# Patient Record
Sex: Male | Born: 1965 | Race: Black or African American | Hispanic: No | Smoking: Former smoker
Health system: Southern US, Community
[De-identification: ages and names within clinical notes are randomized; demographics above are authoritative.]

## PROBLEM LIST (undated history)

## (undated) DIAGNOSIS — Z9989 Dependence on other enabling machines and devices: Secondary | ICD-10-CM

## (undated) DIAGNOSIS — T8859XA Other complications of anesthesia, initial encounter: Secondary | ICD-10-CM

## (undated) DIAGNOSIS — I351 Nonrheumatic aortic (valve) insufficiency: Secondary | ICD-10-CM

## (undated) DIAGNOSIS — K5790 Diverticulosis of intestine, part unspecified, without perforation or abscess without bleeding: Secondary | ICD-10-CM

## (undated) DIAGNOSIS — G4733 Obstructive sleep apnea (adult) (pediatric): Secondary | ICD-10-CM

## (undated) DIAGNOSIS — M1990 Unspecified osteoarthritis, unspecified site: Secondary | ICD-10-CM

## (undated) DIAGNOSIS — K449 Diaphragmatic hernia without obstruction or gangrene: Secondary | ICD-10-CM

## (undated) DIAGNOSIS — E785 Hyperlipidemia, unspecified: Secondary | ICD-10-CM

## (undated) DIAGNOSIS — K5792 Diverticulitis of intestine, part unspecified, without perforation or abscess without bleeding: Secondary | ICD-10-CM

## (undated) DIAGNOSIS — Z973 Presence of spectacles and contact lenses: Secondary | ICD-10-CM

## (undated) DIAGNOSIS — Q2543 Congenital aneurysm of aorta: Secondary | ICD-10-CM

## (undated) DIAGNOSIS — I10 Essential (primary) hypertension: Secondary | ICD-10-CM

## (undated) DIAGNOSIS — N329 Bladder disorder, unspecified: Secondary | ICD-10-CM

## (undated) DIAGNOSIS — Q2549 Other congenital malformations of aorta: Secondary | ICD-10-CM

## (undated) DIAGNOSIS — I4892 Unspecified atrial flutter: Secondary | ICD-10-CM

## (undated) DIAGNOSIS — N400 Enlarged prostate without lower urinary tract symptoms: Secondary | ICD-10-CM

## (undated) DIAGNOSIS — I712 Thoracic aortic aneurysm, without rupture, unspecified: Secondary | ICD-10-CM

## (undated) HISTORY — PX: SHOULDER SURGERY: SHX246

## (undated) HISTORY — DX: Diverticulosis of intestine, part unspecified, without perforation or abscess without bleeding: K57.90

## (undated) HISTORY — PX: COLON RESECTION: SHX5231

## (undated) HISTORY — DX: Nonrheumatic aortic (valve) insufficiency: I35.1

## (undated) HISTORY — DX: Thoracic aortic aneurysm, without rupture, unspecified: I71.20

## (undated) HISTORY — DX: Unspecified atrial flutter: I48.92

---

## 1993-06-28 HISTORY — PX: KNEE ARTHROSCOPY: SHX127

## 1997-06-28 HISTORY — PX: ELBOW SURGERY: SHX618

## 1998-07-23 ENCOUNTER — Other Ambulatory Visit: Admission: RE | Admit: 1998-07-23 | Discharge: 1998-07-23 | Payer: Self-pay | Admitting: Orthopedic Surgery

## 2000-06-28 HISTORY — PX: COLON RESECTION: SHX5231

## 2004-01-13 ENCOUNTER — Encounter: Admission: RE | Admit: 2004-01-13 | Discharge: 2004-01-13 | Payer: Self-pay | Admitting: Internal Medicine

## 2004-01-21 ENCOUNTER — Encounter: Admission: RE | Admit: 2004-01-21 | Discharge: 2004-01-21 | Payer: Self-pay | Admitting: Internal Medicine

## 2004-08-07 ENCOUNTER — Encounter: Admission: RE | Admit: 2004-08-07 | Discharge: 2004-08-07 | Payer: Self-pay | Admitting: Internal Medicine

## 2004-08-09 ENCOUNTER — Emergency Department (HOSPITAL_COMMUNITY): Admission: EM | Admit: 2004-08-09 | Discharge: 2004-08-09 | Payer: Self-pay | Admitting: Emergency Medicine

## 2004-08-13 ENCOUNTER — Encounter: Admission: RE | Admit: 2004-08-13 | Discharge: 2004-08-13 | Payer: Self-pay | Admitting: Internal Medicine

## 2004-08-24 ENCOUNTER — Ambulatory Visit (HOSPITAL_COMMUNITY): Admission: RE | Admit: 2004-08-24 | Discharge: 2004-08-24 | Payer: Self-pay | Admitting: Gastroenterology

## 2011-09-25 ENCOUNTER — Emergency Department (HOSPITAL_COMMUNITY)
Admission: EM | Admit: 2011-09-25 | Discharge: 2011-09-25 | Disposition: A | Discharge status: Home / Self Care | Payer: 59 | Attending: Emergency Medicine | Admitting: Emergency Medicine

## 2011-09-25 ENCOUNTER — Encounter (HOSPITAL_COMMUNITY): Payer: Self-pay | Admitting: *Deleted

## 2011-09-25 ENCOUNTER — Emergency Department (HOSPITAL_COMMUNITY): Payer: 59

## 2011-09-25 DIAGNOSIS — J40 Bronchitis, not specified as acute or chronic: Secondary | ICD-10-CM | POA: Insufficient documentation

## 2011-09-25 DIAGNOSIS — I10 Essential (primary) hypertension: Secondary | ICD-10-CM | POA: Insufficient documentation

## 2011-09-25 HISTORY — DX: Essential (primary) hypertension: I10

## 2011-09-25 LAB — CBC
HCT: 42.5 % (ref 39.0–52.0)
Hemoglobin: 14.7 g/dL (ref 13.0–17.0)
MCV: 78.3 fL (ref 78.0–100.0)
Platelets: 179 10*3/uL (ref 150–400)
RBC: 5.43 MIL/uL (ref 4.22–5.81)
WBC: 3.5 10*3/uL — ABNORMAL LOW (ref 4.0–10.5)

## 2011-09-25 LAB — BASIC METABOLIC PANEL
Calcium: 8.9 mg/dL (ref 8.4–10.5)
Creatinine, Ser: 1.43 mg/dL — ABNORMAL HIGH (ref 0.50–1.35)
GFR calc Af Amer: 68 mL/min — ABNORMAL LOW (ref >90)
GFR calc non Af Amer: 59 mL/min — ABNORMAL LOW (ref >90)

## 2011-09-25 MED ORDER — AZITHROMYCIN 250 MG PO TABS: 250.0000 mg | ORAL_TABLET | Freq: Every day | ORAL | Status: AC

## 2011-09-25 MED ORDER — BENZONATATE 100 MG PO CAPS: 100.0000 mg | ORAL_CAPSULE | Freq: Three times a day (TID) | ORAL | Status: AC | PRN

## 2011-09-25 NOTE — ED Notes (Signed)
Pt c/o intermittent chills. States that he was seen at Urgent Care three days ago and given rx for bactrim. States that he does not feel better and feels like he is "dragging" at the end of the day.

## 2011-09-25 NOTE — ED Notes (Signed)
Pt has been having continuing fever, chills, congestion and generally not feeling well.  Pt is on on antibiotic for URI

## 2011-09-25 NOTE — Discharge Instructions (Signed)
Bronchitis Bronchitis is a problem of the air tubes leading to your lungs. This problem makes it hard for air to get in and out of the lungs. You may cough a lot because your air tubes are narrow. Going without care can cause lasting (chronic) bronchitis. HOME CARE   Drink enough fluids to keep your pee (urine) clear or pale yellow.   Use a cool mist humidifier.   Quit smoking if you smoke. If you keep smoking, the bronchitis might not get better.   Only take medicine as told by your doctor.  GET HELP RIGHT AWAY IF:   Coughing keeps you awake.   You start to wheeze.   You become more sick or weak.   You have a hard time breathing or get short of breath.   You cough up blood.   Coughing lasts more than 2 weeks.   You have a fever.   Your baby is older than 3 months with a rectal temperature of 102 F (38.9 C) or higher.   Your baby is 58 months old or younger with a rectal temperature of 100.4 F (38 C) or higher.  MAKE SURE YOU:  Understand these instructions.   Will watch your condition.   Will get help right away if you are not doing well or get worse.  Document Released: 12/01/2007 Document Revised: 06/03/2011 Document Reviewed: 05/16/2009 [facility] Patient Information 2012 [place], [place].

## 2011-09-25 NOTE — ED Provider Notes (Signed)
History     CSN: [id number]  Arrival date & time 09/25/11  0308   First MD Initiated Contact with Patient 09/25/11 [phone]      Chief Complaint  Patient presents with  . Fever    (Consider location/radiation/quality/duration/timing/severity/associated sxs/prior treatment) HPI Pt with > 1week of worsening cough productive of yellow sputum, fatigue and subjective fevers and chills. Pt seen at urgent care center and treated for small abscess on abd which is now healed. But not feeling any better. No chest pain, SOB, lower ext swelling Past Medical History  Diagnosis Date  . Hypertension     Past Surgical History  Procedure Date  . Abdominal surgery   . Shoulder arthrotomy     No family history on file.  History  Substance Use Topics  . Smoking status: Not on file  . Smokeless tobacco: Not on file  . Alcohol Use: Yes      Review of Systems  Constitutional: Positive for fever, chills and fatigue.  HENT: Negative for congestion and sore throat.   Respiratory: Positive for cough. Negative for shortness of breath.   Cardiovascular: Negative for chest pain, palpitations and leg swelling.  Gastrointestinal: Negative for nausea, vomiting, abdominal pain and diarrhea.  Musculoskeletal: Negative for back pain.  Skin: Negative for color change, pallor and rash.  Neurological: Negative for dizziness, weakness, numbness and headaches.    Allergies  Review of patient's allergies indicates no known allergies.  Home Medications   Current Outpatient Rx  Name Route Sig Dispense Refill  . ACETAMINOPHEN 325 MG PO TABS Oral Take 650 mg by mouth every 4 (four) hours as needed. For fever    . SULFAMETHOXAZOLE-TRIMETHOPRIM 800-160 MG PO TABS Oral Take 1 tablet by mouth 2 (two) times daily. For 10 days. Started on Wednesday 3/27    . VALSARTAN 40 MG PO TABS Oral Take 40 mg by mouth daily.    . AZITHROMYCIN 250 MG PO TABS Oral Take 1 tablet (250 mg total) by mouth daily. Take first 2  tablets together, then 1 every day until finished. 6 tablet 0  . BENZONATATE 100 MG PO CAPS Oral Take 1 capsule (100 mg total) by mouth 3 (three) times daily as needed for cough. 20 capsule 0    BP 143/88  Pulse 81  Temp(Src) 98.2 F (36.8 C) (Oral)  Resp 20  SpO2 100%  Physical Exam  Nursing note and vitals reviewed. Constitutional: He is oriented to person, place, and time. He appears well-developed and well-nourished. No distress.  HENT:  Head: Normocephalic and atraumatic.  Mouth/Throat: Oropharynx is clear and moist.  Eyes: EOM are normal. Pupils are equal, round, and reactive to light.  Neck: Normal range of motion. Neck supple.  Cardiovascular: Normal rate and regular rhythm.   Pulmonary/Chest: Effort normal. No respiratory distress. He has no wheezes. He has rales (scattered rales).  Abdominal: Soft. Bowel sounds are normal. There is no tenderness. There is no rebound and no guarding.  Musculoskeletal: Normal range of motion. He exhibits no edema and no tenderness.  Neurological: He is alert and oriented to person, place, and time.  Skin: Skin is warm and dry. No rash noted. No erythema.       Well healing abscess. No evidence of cellulitis  Psychiatric: He has a normal mood and affect. His behavior is normal.    ED Course  Procedures (including critical care time)  Labs Reviewed  CBC - Abnormal; Notable for the following:    WBC 3.5 (*)  All other components within normal limits  BASIC METABOLIC PANEL - Abnormal; Notable for the following:    Glucose, Bld 122 (*)    Creatinine, Ser 1.43 (*)    GFR calc non Af Amer 59 (*)    GFR calc Af Amer 68 (*)    All other components within normal limits   Dg Chest 2 View  09/25/2011  *RADIOLOGY REPORT*  Clinical Data: Congestion, cough, sinus pressure, fever and body aches for 6 days.  CHEST - 2 VIEW  Comparison: None.  Findings: The heart size and pulmonary vascularity are normal. The lungs appear clear and expanded  without focal air space disease or consolidation. No blunting of the costophrenic angles.  No pneumothorax.  Postoperative changes in the left shoulder.  IMPRESSION: No evidence of active pulmonary disease.  Original Report Authenticated By: Marlon Pel, M.D.     1. Bronchitis       MDM  Bronchitis vs pneumonia        Loren Racer, MD 09/25/11 [id number]

## 2012-07-09 ENCOUNTER — Emergency Department (HOSPITAL_BASED_OUTPATIENT_CLINIC_OR_DEPARTMENT_OTHER)
Admission: EM | Admit: 2012-07-09 | Discharge: 2012-07-09 | Disposition: A | Discharge status: Home / Self Care | Payer: 59 | Attending: Emergency Medicine | Admitting: Emergency Medicine

## 2012-07-09 ENCOUNTER — Encounter (HOSPITAL_BASED_OUTPATIENT_CLINIC_OR_DEPARTMENT_OTHER): Payer: Self-pay | Admitting: *Deleted

## 2012-07-09 DIAGNOSIS — I10 Essential (primary) hypertension: Secondary | ICD-10-CM | POA: Insufficient documentation

## 2012-07-09 DIAGNOSIS — L272 Dermatitis due to ingested food: Secondary | ICD-10-CM | POA: Insufficient documentation

## 2012-07-09 DIAGNOSIS — R221 Localized swelling, mass and lump, neck: Secondary | ICD-10-CM | POA: Insufficient documentation

## 2012-07-09 DIAGNOSIS — R21 Rash and other nonspecific skin eruption: Secondary | ICD-10-CM | POA: Insufficient documentation

## 2012-07-09 DIAGNOSIS — Z79899 Other long term (current) drug therapy: Secondary | ICD-10-CM | POA: Insufficient documentation

## 2012-07-09 DIAGNOSIS — L509 Urticaria, unspecified: Secondary | ICD-10-CM | POA: Insufficient documentation

## 2012-07-09 DIAGNOSIS — T7840XA Allergy, unspecified, initial encounter: Secondary | ICD-10-CM

## 2012-07-09 DIAGNOSIS — R220 Localized swelling, mass and lump, head: Secondary | ICD-10-CM | POA: Insufficient documentation

## 2012-07-09 MED ORDER — FAMOTIDINE 20 MG PO TABS
ORAL_TABLET | ORAL | Status: AC
  Filled 2012-07-09: qty 1

## 2012-07-09 MED ORDER — FAMOTIDINE 20 MG PO TABS
40.0000 mg | ORAL_TABLET | Freq: Once | ORAL | Status: AC
  Administered 2012-07-09: 40 mg via ORAL
  Filled 2012-07-09: qty 1

## 2012-07-09 MED ORDER — METHYLPREDNISOLONE SODIUM SUCC 125 MG IJ SOLR
125.0000 mg | Freq: Once | INTRAMUSCULAR | Status: AC
  Administered 2012-07-09: 125 mg via INTRAMUSCULAR
  Filled 2012-07-09: qty 2

## 2012-07-09 MED ORDER — DIPHENHYDRAMINE HCL 25 MG PO CAPS
25.0000 mg | ORAL_CAPSULE | Freq: Once | ORAL | Status: AC
  Administered 2012-07-09: 25 mg via ORAL
  Filled 2012-07-09: qty 1

## 2012-07-09 MED ORDER — PREDNISONE 20 MG PO TABS: 40.0000 mg | ORAL_TABLET | Freq: Every day | ORAL | Status: DC

## 2012-07-09 MED ORDER — EPINEPHRINE 0.3 MG/0.3ML IJ DEVI: 0.3000 mg | Freq: Once | INTRAMUSCULAR | Status: DC

## 2012-07-09 NOTE — Discharge Instructions (Signed)
Epinephrine Injection  Epinephrine is a medicine given by injection to temporarily treat an emergency allergic reaction. It is also used to treat severe asthmatic attacks and other lung problems. The medicine helps to enlarge (dilate) the small breathing tubes of the lungs. A life-threatening, sudden allergic reaction that involves the whole body is called anaphylaxis. Because of potential side effects, epinephrine should only be used as directed by your caregiver.  RISKS AND COMPLICATIONS  Possible side effects of epinephrine injections include:   Chest pain.   Irregular or rapid heartbeat.   Shortness of breath.   Nausea.   Vomiting.   Abdominal pain or cramping.   Sweating.   Dizziness.   Weakness.   Headache.   Nervousness.  Report all side effects to your caregiver.  HOW TO GIVE AN EPINEPHRINE INJECTION  Give the epinephrine injection immediately when symptoms of a severe reaction begin. Inject the medicine into the outer thigh or any available, large muscle. Your caregiver can teach you how to do this. You do not need to remove any clothing. After the injection, call your local emergency services (911 in [place] Even if you improve after the injection, you need to be examined at a hospital emergency department. Epinephrine works quickly, but it also wears off quickly. Delayed reactions can occur. A delayed reaction may be as serious and dangerous as the initial reaction.  HOME CARE INSTRUCTIONS   Make sure you and your family know how to give an epinephrine injection.   Use epinephrine injections as directed by your caregiver. Do not use this medicine more often or in larger doses than prescribed.   Always carry your epinephrine injection or anaphylaxis kit with you. This can be lifesaving if you have a severe reaction.   Store the medicine in a cool, dry place. If the medicine becomes discolored or cloudy, dispose of it properly and replace it with new medicine.    Check the expiration date on your medicine. It may be unsafe to use medicines past their expiration date.   Tell your caregiver about any other medicines you are taking. Some medicines can react badly with epinephrine.   Tell your caregiver about any medical conditions you have, such as diabetes, high blood pressure (hypertension), heart disease, irregular heartbeats, or if you are pregnant.  SEEK IMMEDIATE MEDICAL CARE IF:   You have used an epinephrine injection. Call your local emergency services (911 in [place] Even if you improve after the injection, you need to be examined at a hospital emergency department to make sure your allergic reaction is under control. You will also be monitored for adverse effects from the medicine.   You have chest pain.   You have irregular or fast heartbeats.   You have shortness of breath.   You have severe headaches.   You have severe nausea, vomiting, or abdominal cramps.   You have severe pain, swelling, or redness in the area where you gave the injection.  Document Released: 06/11/2000 Document Revised: 09/06/2011 Document Reviewed: 03/03/2011  [employer] Patient Information 2013 [employer].

## 2012-07-09 NOTE — ED Notes (Signed)
Patient reports he is feeling better, states decreased swelling, itching. Decrease in edema to throat/neck noted on reassessment. Pt tolerating PO fluids. Denies discomfort or difficulty swallowing, in NAD.

## 2012-07-09 NOTE — ED Notes (Signed)
Pt states he ate crablegs about 1500 and began breaking out in welts and itching about 30 min ago. Took Benadryl x 3 PTA

## 2012-07-09 NOTE — ED Provider Notes (Signed)
History   This chart was scribed for [name]. [name], MD by Donne Anon, ED Scribe. This patient was seen in room MH09/MH09 and the patient's care was started at 2051.   CSN: [id number]  Arrival date & time 07/09/12  Matthew Hoffman   First MD Initiated Contact with Patient 07/09/12 2051      Chief Complaint  Patient presents with  . Allergic Reaction     The history is provided by the patient. No language interpreter was used.   Matthew Hoffman is a 47 y.o. male who presents to the Emergency Department complaining of an allergic reaction which occurred while he was eating crab 6 hours ago, with his symptoms starting 90 minutes after eating. He reports that 7 years ago he had a similar episode with crab, but he has since eaten crab without incident. He reports associated facial swelling, neck swelling, and hives. He took Benadryl 3 hours ago with mild relief. He denies any other health issues.  Past Medical History  Diagnosis Date  . Hypertension     Past Surgical History  Procedure Date  . Abdominal surgery   . Shoulder arthrotomy     History reviewed. No pertinent family history.  History  Substance Use Topics  . Smoking status: Never Smoker   . Smokeless tobacco: Not on file  . Alcohol Use: Yes      Review of Systems  HENT: Positive for facial swelling.   Respiratory: Negative for shortness of breath.   Skin: Positive for rash.    Allergies  Shellfish allergy  Home Medications   Current Outpatient Rx  Name  Route  Sig  Dispense  Refill  . ACETAMINOPHEN 325 MG PO TABS   Oral   Take 650 mg by mouth every 4 (four) hours as needed. For fever         . EPINEPHRINE 0.3 MG/0.3ML IJ DEVI   Intramuscular   Inject 0.3 mLs (0.3 mg total) into the muscle once.   1 Device   2   . PREDNISONE 20 MG PO TABS   Oral   Take 2 tablets (40 mg total) by mouth daily.   14 tablet   0   . SULFAMETHOXAZOLE-TRIMETHOPRIM 800-160 MG PO TABS   Oral   Take 1 tablet by mouth 2  (two) times daily. For 10 days. Started on Wednesday 3/27         . VALSARTAN 40 MG PO TABS   Oral   Take 40 mg by mouth daily.           Triage Vitals: BP 149/95  Pulse 58  Temp 98 F (36.7 C) (Oral)  Resp 18  SpO2 98%  Physical Exam  Nursing note and vitals reviewed. Constitutional: He is oriented to person, place, and time. He appears well-developed and well-nourished. No distress.  HENT:  Head: Normocephalic and atraumatic.  Mouth/Throat: Oropharynx is clear and moist.  Eyes: EOM are normal. Pupils are equal, round, and reactive to light.  Neck: Neck supple. No tracheal deviation present.  Cardiovascular: Normal rate.   Pulmonary/Chest: Effort normal and breath sounds normal. No stridor. No respiratory distress. He has no wheezes.  Musculoskeletal: Normal range of motion. He exhibits edema.  Neurological: He is alert and oriented to person, place, and time.  Skin: Skin is warm and dry. Rash noted.  Psychiatric: He has a normal mood and affect. His behavior is normal.    ED Course  Procedures (including critical care time) DIAGNOSTIC STUDIES: Oxygen  Saturation is 98% on room air, normal by my interpretation.    COORDINATION OF CARE: 9:11 PM Discussed treatment plan which includes steroids and an Epi-Pen with pt at bedside and pt agreed to plan.   10:26 PM Recheck. Pt states that medication is helping and his symptoms are beginning to improve.   Meds ordered this encounter  Medications  . methylPREDNISolone sodium succinate (SOLU-MEDROL) 125 mg/2 mL injection 125 mg    Sig:   . famotidine (PEPCID) tablet 40 mg    Sig:   . diphenhydrAMINE (BENADRYL) capsule 25 mg    Sig:   . famotidine (PEPCID) 20 MG tablet    Sig:     Shon Baton, Bobby: cabinet override  . predniSONE (DELTASONE) 20 MG tablet    Sig: Take 2 tablets (40 mg total) by mouth daily.    Dispense:  14 tablet    Refill:  0  . EPINEPHrine (EPIPEN) 0.3 mg/0.3 mL DEVI    Sig: Inject 0.3 mLs (0.3 mg  total) into the muscle once.    Dispense:  1 Device    Refill:  2      Labs Reviewed - No data to display No results found.   1. Severe allergic reaction     10:39 PM PT reports feeling improved, face is less swollen, airway remains intact, no stridor, wheezing.  Will give Rx for epi pen and prednisone.  Can follow up with PCP.  Encouraged use of benadryl at home over next 24 hours as scheduled dose, and allergy testing formally in 2 weeks.    MDM  I personally performed the services described in this documentation, which was scribed in my presence. The recorded information has been reviewed and is accurate.   Pt with delayed allergic reaction, but involving lips, mouth and face.  No airway compromise at present.  Pt has had allergies to crab legs 7 years ago, had an epi pen, but has had crab legs since then with no problems.  No other new products, meds.          [name]. [name], MD 07/09/12 2242

## 2012-07-09 NOTE — ED Notes (Signed)
RTT at bedside assessing patient.

## 2013-01-19 ENCOUNTER — Other Ambulatory Visit: Payer: Self-pay | Admitting: Sports Medicine

## 2013-01-19 DIAGNOSIS — M549 Dorsalgia, unspecified: Secondary | ICD-10-CM

## 2013-01-24 ENCOUNTER — Ambulatory Visit
Admission: RE | Admit: 2013-01-24 | Discharge: 2013-01-24 | Disposition: A | Discharge status: Home / Self Care | Payer: 59 | Source: Ambulatory Visit | Attending: Sports Medicine | Admitting: Sports Medicine

## 2013-01-24 DIAGNOSIS — M549 Dorsalgia, unspecified: Secondary | ICD-10-CM

## 2014-02-20 ENCOUNTER — Other Ambulatory Visit: Payer: Self-pay | Admitting: Sports Medicine

## 2014-02-20 DIAGNOSIS — S46212A Strain of muscle, fascia and tendon of other parts of biceps, left arm, initial encounter: Secondary | ICD-10-CM

## 2014-02-20 DIAGNOSIS — S76112A Strain of left quadriceps muscle, fascia and tendon, initial encounter: Secondary | ICD-10-CM

## 2014-03-01 ENCOUNTER — Ambulatory Visit
Admission: RE | Admit: 2014-03-01 | Discharge: 2014-03-01 | Disposition: A | Discharge status: Home / Self Care | Payer: 59 | Source: Ambulatory Visit | Attending: Sports Medicine | Admitting: Sports Medicine

## 2014-03-01 DIAGNOSIS — S76112A Strain of left quadriceps muscle, fascia and tendon, initial encounter: Secondary | ICD-10-CM

## 2015-04-18 ENCOUNTER — Other Ambulatory Visit: Payer: Self-pay | Admitting: Gastroenterology

## 2015-04-18 DIAGNOSIS — R109 Unspecified abdominal pain: Secondary | ICD-10-CM

## 2015-04-28 ENCOUNTER — Ambulatory Visit
Admission: RE | Admit: 2015-04-28 | Discharge: 2015-04-28 | Disposition: A | Discharge status: Home / Self Care | Payer: 59 | Source: Ambulatory Visit | Attending: Gastroenterology | Admitting: Gastroenterology

## 2015-04-28 DIAGNOSIS — R109 Unspecified abdominal pain: Secondary | ICD-10-CM

## 2015-04-28 MED ORDER — IOPAMIDOL (ISOVUE-300) INJECTION 61%
125.0000 mL | Freq: Once | INTRAVENOUS | Status: AC | PRN
  Administered 2015-04-28: 125 mL via INTRAVENOUS

## 2015-10-27 HISTORY — PX: UMBILICAL HERNIA REPAIR: SHX196

## 2016-05-18 ENCOUNTER — Other Ambulatory Visit: Payer: Self-pay | Admitting: Urology

## 2016-06-09 ENCOUNTER — Encounter (HOSPITAL_BASED_OUTPATIENT_CLINIC_OR_DEPARTMENT_OTHER): Payer: Self-pay | Admitting: *Deleted

## 2016-06-09 NOTE — Progress Notes (Signed)
NPO AFTER MN. ARRIVE AT 1100. NEEDS ISTAT 8 AND EKG.  

## 2016-06-10 ENCOUNTER — Ambulatory Visit (HOSPITAL_BASED_OUTPATIENT_CLINIC_OR_DEPARTMENT_OTHER): Payer: BLUE CROSS/BLUE SHIELD | Admitting: Anesthesiology

## 2016-06-10 ENCOUNTER — Encounter (HOSPITAL_BASED_OUTPATIENT_CLINIC_OR_DEPARTMENT_OTHER)
Admission: RE | Disposition: A | Discharge status: Home / Self Care | Payer: Self-pay | Source: Ambulatory Visit | Attending: Urology

## 2016-06-10 ENCOUNTER — Encounter (HOSPITAL_BASED_OUTPATIENT_CLINIC_OR_DEPARTMENT_OTHER): Payer: Self-pay | Admitting: Anesthesiology

## 2016-06-10 ENCOUNTER — Ambulatory Visit (HOSPITAL_BASED_OUTPATIENT_CLINIC_OR_DEPARTMENT_OTHER)
Admission: RE | Admit: 2016-06-10 | Discharge: 2016-06-10 | Disposition: A | Discharge status: Home / Self Care | Payer: BLUE CROSS/BLUE SHIELD | Source: Ambulatory Visit | Attending: Urology | Admitting: Urology

## 2016-06-10 ENCOUNTER — Other Ambulatory Visit: Payer: Self-pay

## 2016-06-10 DIAGNOSIS — I10 Essential (primary) hypertension: Secondary | ICD-10-CM | POA: Insufficient documentation

## 2016-06-10 DIAGNOSIS — Z91013 Allergy to seafood: Secondary | ICD-10-CM | POA: Diagnosis not present

## 2016-06-10 DIAGNOSIS — Z79899 Other long term (current) drug therapy: Secondary | ICD-10-CM | POA: Insufficient documentation

## 2016-06-10 DIAGNOSIS — F17290 Nicotine dependence, other tobacco product, uncomplicated: Secondary | ICD-10-CM | POA: Diagnosis not present

## 2016-06-10 DIAGNOSIS — K449 Diaphragmatic hernia without obstruction or gangrene: Secondary | ICD-10-CM | POA: Diagnosis not present

## 2016-06-10 DIAGNOSIS — M1990 Unspecified osteoarthritis, unspecified site: Secondary | ICD-10-CM | POA: Diagnosis not present

## 2016-06-10 DIAGNOSIS — Z302 Encounter for sterilization: Secondary | ICD-10-CM | POA: Diagnosis not present

## 2016-06-10 DIAGNOSIS — G4733 Obstructive sleep apnea (adult) (pediatric): Secondary | ICD-10-CM | POA: Insufficient documentation

## 2016-06-10 DIAGNOSIS — Z419 Encounter for procedure for purposes other than remedying health state, unspecified: Secondary | ICD-10-CM

## 2016-06-10 HISTORY — DX: Unspecified osteoarthritis, unspecified site: M19.90

## 2016-06-10 HISTORY — DX: Diaphragmatic hernia without obstruction or gangrene: K44.9

## 2016-06-10 HISTORY — DX: Dependence on other enabling machines and devices: Z99.89

## 2016-06-10 HISTORY — DX: Benign prostatic hyperplasia without lower urinary tract symptoms: N40.0

## 2016-06-10 HISTORY — DX: Presence of spectacles and contact lenses: Z97.3

## 2016-06-10 HISTORY — DX: Obstructive sleep apnea (adult) (pediatric): G47.33

## 2016-06-10 HISTORY — PX: VASECTOMY: SHX75

## 2016-06-10 LAB — POCT I-STAT 4, (NA,K, GLUC, HGB,HCT)
Glucose, Bld: 95 mg/dL (ref 65–99)
HEMATOCRIT: 44 % (ref 39.0–52.0)
HEMOGLOBIN: 15 g/dL (ref 13.0–17.0)
Potassium: 3.8 mmol/L (ref 3.5–5.1)
Sodium: 142 mmol/L (ref 135–145)

## 2016-06-10 SURGERY — VASECTOMY
Anesthesia: Monitor Anesthesia Care | Laterality: Bilateral

## 2016-06-10 MED ORDER — FENTANYL CITRATE (PF) 100 MCG/2ML IJ SOLN
INTRAMUSCULAR | Status: AC
  Filled 2016-06-10: qty 2

## 2016-06-10 MED ORDER — CEFAZOLIN SODIUM-DEXTROSE 2-4 GM/100ML-% IV SOLN
2.0000 g | INTRAVENOUS | Status: AC
  Administered 2016-06-10: 2 g via INTRAVENOUS
  Filled 2016-06-10: qty 100

## 2016-06-10 MED ORDER — ONDANSETRON HCL 4 MG/2ML IJ SOLN
INTRAMUSCULAR | Status: AC
  Filled 2016-06-10: qty 2

## 2016-06-10 MED ORDER — KETOROLAC TROMETHAMINE 30 MG/ML IJ SOLN
INTRAMUSCULAR | Status: DC | PRN
  Administered 2016-06-10: 30 mg via INTRAVENOUS

## 2016-06-10 MED ORDER — HYDROMORPHONE HCL 1 MG/ML IJ SOLN
0.2500 mg | INTRAMUSCULAR | Status: DC | PRN
  Filled 2016-06-10: qty 0.5

## 2016-06-10 MED ORDER — PROPOFOL 500 MG/50ML IV EMUL
INTRAVENOUS | Status: AC
  Filled 2016-06-10: qty 50

## 2016-06-10 MED ORDER — FENTANYL CITRATE (PF) 100 MCG/2ML IJ SOLN
INTRAMUSCULAR | Status: DC | PRN
  Administered 2016-06-10 (×2): 25 ug via INTRAVENOUS

## 2016-06-10 MED ORDER — LIDOCAINE 2% (20 MG/ML) 5 ML SYRINGE
INTRAMUSCULAR | Status: AC
  Filled 2016-06-10: qty 5

## 2016-06-10 MED ORDER — MIDAZOLAM HCL 2 MG/2ML IJ SOLN
INTRAMUSCULAR | Status: AC
  Filled 2016-06-10: qty 2

## 2016-06-10 MED ORDER — LIDOCAINE 2% (20 MG/ML) 5 ML SYRINGE
INTRAMUSCULAR | Status: DC | PRN
  Administered 2016-06-10: 50 mg via INTRAVENOUS

## 2016-06-10 MED ORDER — LACTATED RINGERS IV SOLN
INTRAVENOUS | Status: DC
  Administered 2016-06-10 (×2): via INTRAVENOUS
  Filled 2016-06-10: qty 1000

## 2016-06-10 MED ORDER — MIDAZOLAM HCL 5 MG/5ML IJ SOLN
INTRAMUSCULAR | Status: DC | PRN
  Administered 2016-06-10: 2 mg via INTRAVENOUS

## 2016-06-10 MED ORDER — CEFAZOLIN SODIUM-DEXTROSE 2-4 GM/100ML-% IV SOLN
INTRAVENOUS | Status: AC
  Filled 2016-06-10: qty 100

## 2016-06-10 MED ORDER — BUPIVACAINE HCL (PF) 0.25 % IJ SOLN
INTRAMUSCULAR | Status: DC | PRN
  Administered 2016-06-10: 5 mL

## 2016-06-10 MED ORDER — CEFAZOLIN IN D5W 1 GM/50ML IV SOLN
1.0000 g | INTRAVENOUS | Status: DC
  Filled 2016-06-10: qty 50

## 2016-06-10 MED ORDER — PROPOFOL 500 MG/50ML IV EMUL
INTRAVENOUS | Status: DC | PRN
  Administered 2016-06-10: 200 ug/kg/min via INTRAVENOUS

## 2016-06-10 MED ORDER — BUPIVACAINE HCL (PF) 0.25 % IJ SOLN
INTRAMUSCULAR | Status: AC
  Filled 2016-06-10: qty 30

## 2016-06-10 SURGICAL SUPPLY — 33 items
BLADE CLIPPER SURG (BLADE) IMPLANT
BLADE SURG 15 STRL LF DISP TIS (BLADE) ×1 IMPLANT
BLADE SURG 15 STRL SS (BLADE) ×2
BNDG GAUZE ELAST 4 BULKY (GAUZE/BANDAGES/DRESSINGS) ×3 IMPLANT
CLOTH BEACON ORANGE TIMEOUT ST (SAFETY) ×3 IMPLANT
COVER BACK TABLE 60X90IN (DRAPES) ×3 IMPLANT
COVER MAYO STAND STRL (DRAPES) ×3 IMPLANT
DRAPE LAPAROTOMY 100X72 PEDS (DRAPES) ×3 IMPLANT
ELECT NEEDLE TIP 2.8 STRL (NEEDLE) IMPLANT
ELECT REM PT RETURN 9FT ADLT (ELECTROSURGICAL) ×3
ELECTRODE REM PT RTRN 9FT ADLT (ELECTROSURGICAL) ×1 IMPLANT
GLOVE BIO SURGEON STRL SZ8 (GLOVE) ×3 IMPLANT
GLOVE BIOGEL PI IND STRL 7.5 (GLOVE) ×2 IMPLANT
GLOVE BIOGEL PI INDICATOR 7.5 (GLOVE) ×4
GOWN W/2 COTTON TOWELS 2 STD (GOWNS) ×3 IMPLANT
KIT ROOM TURNOVER WOR (KITS) ×3 IMPLANT
MANIFOLD NEPTUNE II (INSTRUMENTS) IMPLANT
NEEDLE HYPO 25X1 1.5 SAFETY (NEEDLE) ×3 IMPLANT
NEEDLE HYPO 25X5/8 SAFETYGLIDE (NEEDLE) IMPLANT
NS IRRIG 500ML POUR BTL (IV SOLUTION) IMPLANT
PACK BASIN DAY SURGERY FS (CUSTOM PROCEDURE TRAY) ×3 IMPLANT
PENCIL BUTTON HOLSTER BLD 10FT (ELECTRODE) ×3 IMPLANT
SUPPORT SCROTAL LG STRP (MISCELLANEOUS) ×2 IMPLANT
SUPPORTER ATHLETIC LG (MISCELLANEOUS) ×1
SUT CHROMIC 3 0 PS 2 (SUTURE) ×3 IMPLANT
SUT SILK 2 0 (SUTURE) ×2
SUT SILK 2-0 18XBRD TIE 12 (SUTURE) ×1 IMPLANT
SYR CONTROL 10ML LL (SYRINGE) ×3 IMPLANT
TOWEL OR 17X24 6PK STRL BLUE (TOWEL DISPOSABLE) ×3 IMPLANT
TRAY DSU PREP LF (CUSTOM PROCEDURE TRAY) ×3 IMPLANT
TUBE CONNECTING 12'X1/4 (SUCTIONS) ×1
TUBE CONNECTING 12X1/4 (SUCTIONS) ×2 IMPLANT
WATER STERILE IRR 500ML POUR (IV SOLUTION) ×3 IMPLANT

## 2016-06-10 NOTE — Transfer of Care (Signed)
Immediate Anesthesia Transfer of Care Note  Patient: Matthew Hoffman  Procedure(s) Performed: Procedure(s): VASECTOMY (Bilateral)  Patient Location: PACU  Anesthesia Type:General  Level of Consciousness: awake, alert , oriented and patient cooperative  Airway & Oxygen Therapy: Patient Spontanous Breathing and Patient connected to nasal cannula oxygen  Post-op Assessment: Report given to RN and Post -op Vital signs reviewed and stable  Post vital signs: Reviewed and stable  Last Vitals:  Vitals:   06/10/16 1100  BP: 140/90  Pulse: (!) 59  Resp: 16  Temp: 36.3 C    Last Pain:  Vitals:   06/10/16 1100  TempSrc: Oral      Patients Stated Pain Goal: 6 (06/10/16 1131)  Complications: No apparent anesthesia complications

## 2016-06-10 NOTE — Op Note (Signed)
Preoperative diagnosis: Desires Sterilization Postop diagnosis: Same  Procedure: 1.  Vasectomy  Attending: Wilkie AyePatrick Allex Lapoint  Anesthesia: General  Estimated blood loss: 5 cc  Drains: 1. none  Specimens: bilateral cross section of vas deferens  Antibiotics: none  Findings: small bilateral spermatic cord lypomas  Indications: Patient is a 50 year old who desires sterilization. After discussing options he desires to proceed with vasectomy.  Procedure in detail: Prior to procedure consent was obtained. Patient was brought to the operating room and briefing was done sure correct patient, correct procedure, correct site.  General anesthesia was in administered patient was placed in the supine position.  The patients genetalia was prepped and draped in the usual, sterile fashion.  A 0.5 cm incision was made in the left hemiscrotum.  The vas was then brought to the level of the incision.  Then using a vas grasper we isolated the vas deferens.  We then used electrocautery to free the vas from the perivasal tissue.  Once the vas was isolated we then placed a clamp on either side of the vas.  Then sharply incised the vas and removed a 1 cm section of vas deferens.  We then cauterized the ends of the vein as and then ligated the vessels with 0 silk suture.  We then placed the individual limbs of vas deferens and separate compartments.  We closed the overlying skin with 3-0 chromic in interrupted fashion.  A similar technique was used on the left side to isolate the left vas.  Left vas was ligated in a similar fashion.  Good hemostasis was also noted in the left hemiscrotum.  We then returned the ends of the vas to separate compartments.  We then closed the overlying skin with 3-0 chromic in an interrupted fashion. This then concluded the procedure which was well tolerated by the patient. Complications: None Condition: Stable, x-rayed, transferred to PACU. Plan: Pt is to be discharged home and followup in  8 weeks with a semen sample

## 2016-06-10 NOTE — Discharge Instructions (Signed)

## 2016-06-10 NOTE — Anesthesia Preprocedure Evaluation (Addendum)
Anesthesia Evaluation  Patient identified by MRN, date of birth, ID band Patient awake    Reviewed: Allergy & Precautions, NPO status , Patient's Chart, lab work & pertinent test results  Airway Mallampati: II  TM Distance: >3 FB     Dental   Pulmonary sleep apnea , Current Smoker,    breath sounds clear to auscultation       Cardiovascular Exercise Tolerance: Good hypertension, Pt. on medications  Rhythm:Regular Rate:Normal     Neuro/Psych    GI/Hepatic Neg liver ROS, hiatal hernia,   Endo/Other  negative endocrine ROS  Renal/GU negative Renal ROS     Musculoskeletal  (+) Arthritis ,   Abdominal   Peds  Hematology   Anesthesia Other Findings   Reproductive/Obstetrics                            Anesthesia Physical Anesthesia Plan  ASA: III  Anesthesia Plan: MAC   Post-op Pain Management:    Induction: Intravenous  Airway Management Planned: Simple Face Mask  Additional Equipment:   Intra-op Plan:   Post-operative Plan:   Informed Consent: I have reviewed the patients History and Physical, chart, labs and discussed the procedure including the risks, benefits and alternatives for the proposed anesthesia with the patient or authorized representative who has indicated his/her understanding and acceptance.   Dental advisory given  Plan Discussed with: CRNA and Anesthesiologist  Anesthesia Plan Comments:         Anesthesia Quick Evaluation

## 2016-06-10 NOTE — Anesthesia Postprocedure Evaluation (Signed)
Anesthesia Post Note  Patient: Matthew Hoffman  Procedure(s) Performed: Procedure(s) (LRB): VASECTOMY (Bilateral)  Patient location during evaluation: PACU Anesthesia Type: MAC Level of consciousness: awake Pain management: pain level controlled Vital Signs Assessment: post-procedure vital signs reviewed and stable Respiratory status: spontaneous breathing Cardiovascular status: stable Anesthetic complications: no    Last Vitals:  Vitals:   06/10/16 1345 06/10/16 1435  BP:  (!) 149/97  Pulse: (!) 54 (!) 51  Resp: 15 16  Temp:  36.7 C    Last Pain:  Vitals:   06/10/16 1415  TempSrc:   PainSc: 0-No pain                 Fina Heizer

## 2016-06-10 NOTE — H&P (Signed)
Urology Admission H&P  Chief Complaint: desires sterilization  History of Present Illness: [name] is a 50yo who desires sterilization. He has a healthy 16 yea rold daughter. His vas are difficult to palpate  Past Medical History:  Diagnosis Date  . Arthritis   . Hiatal hernia    small per ct 2016  . Hypertension   . Hypertrophy of prostate    mild  . OSA on CPAP    per pt moderate osa per study  . Wears glasses    Past Surgical History:  Procedure Laterality Date  . ELBOW SURGERY Right 1999  . KNEE ARTHROSCOPY Left 1995  . SHOULDER SURGERY Bilateral right 1992/  left 1987  . UMBILICAL HERNIA REPAIR  10/2015    Home Medications:  Prescriptions Prior to Admission  Medication Sig Dispense Refill Last Dose  . Multiple Vitamin (MULTIVITAMIN) tablet Take 1 tablet by mouth daily.   Past Week at Unknown time  . valsartan-hydrochlorothiazide (DIOVAN-HCT) 80-12.5 MG tablet Take 1 tablet by mouth every morning.   06/09/2016 at 1800  . EPINEPHrine (EPIPEN 2-PAK) 0.3 mg/0.3 mL IJ SOAJ injection Inject into the muscle as needed.   Unknown at Unknown time   Allergies:  Allergies  Allergen Reactions  . Shellfish Allergy Hives and Swelling    History reviewed. No pertinent family history. Social History:  reports that he has been smoking Cigars.  He has smoked for the past 0.00 years. He has never used smokeless tobacco. He reports that he drinks alcohol. He reports that he does not use drugs.  Review of Systems  All other systems reviewed and are negative.   Physical Exam:  Vital signs in last 24 hours: Temp:  [97.3 F (36.3 C)] 97.3 F (36.3 C) (12/14 1100) Pulse Rate:  [59] 59 (12/14 1100) Resp:  [16] 16 (12/14 1100) BP: (140)/(90) 140/90 (12/14 1100) SpO2:  [100 %] 100 % (12/14 1100) Weight:  [132.9 kg (293 lb)] 132.9 kg (293 lb) (12/14 1100) Physical Exam  Constitutional: He is oriented to person, place, and time. He appears well-developed and well-nourished.  HENT:   Head: Normocephalic and atraumatic.  Eyes: EOM are normal. Pupils are equal, round, and reactive to light.  Neck: Normal range of motion. No thyromegaly present.  Cardiovascular: Normal rate and regular rhythm.   Respiratory: Effort normal. No respiratory distress.  GI: Soft. He exhibits no distension.  Musculoskeletal: Normal range of motion. He exhibits no edema.  Neurological: He is alert and oriented to person, place, and time.  Skin: Skin is warm and dry.  Psychiatric: He has a normal mood and affect. His behavior is normal. Judgment and thought content normal.    Laboratory Data:  Results for orders placed or performed during the hospital encounter of 06/10/16 (from the past 24 hour(s))  I-STAT 4, (NA,K, GLUC, HGB,HCT)     Status: None   Collection Time: 06/10/16 11:51 AM  Result Value Ref Range   Sodium 142 135 - 145 mmol/L   Potassium 3.8 3.5 - 5.1 mmol/L   Glucose, Bld 95 65 - 99 mg/dL   HCT 82.944.0 56.239.0 - 13.052.0 %   Hemoglobin 15.0 13.0 - 17.0 g/dL   No results found for this or any previous visit (from the past 240 hour(s)). Creatinine: No results for input(s): CREATININE in the last 168 hours. Baseline Creatinine: unknwon  Impression/Assessment:  50yo who desires sterilization  Plan:  The risks/benefits/alterantives to vasectomy was explained to the patient and he understands and wishes to  proceed with surgery  Matthew Hoffman 06/10/2016, 12:11 PM

## 2016-06-11 ENCOUNTER — Encounter (HOSPITAL_BASED_OUTPATIENT_CLINIC_OR_DEPARTMENT_OTHER): Payer: Self-pay | Admitting: Urology

## 2017-08-10 ENCOUNTER — Other Ambulatory Visit: Payer: Self-pay | Admitting: Internal Medicine

## 2017-08-10 ENCOUNTER — Ambulatory Visit
Admission: RE | Admit: 2017-08-10 | Discharge: 2017-08-10 | Disposition: A | Discharge status: Home / Self Care | Payer: BLUE CROSS/BLUE SHIELD | Source: Ambulatory Visit | Attending: Internal Medicine | Admitting: Internal Medicine

## 2017-08-10 DIAGNOSIS — R9389 Abnormal findings on diagnostic imaging of other specified body structures: Secondary | ICD-10-CM

## 2017-08-11 ENCOUNTER — Other Ambulatory Visit: Payer: Self-pay | Admitting: Internal Medicine

## 2017-08-12 ENCOUNTER — Other Ambulatory Visit: Payer: Self-pay | Admitting: Internal Medicine

## 2017-08-12 ENCOUNTER — Other Ambulatory Visit: Payer: BLUE CROSS/BLUE SHIELD

## 2017-08-12 ENCOUNTER — Ambulatory Visit
Admission: RE | Admit: 2017-08-12 | Discharge: 2017-08-12 | Disposition: A | Discharge status: Home / Self Care | Payer: BLUE CROSS/BLUE SHIELD | Source: Ambulatory Visit | Attending: Internal Medicine | Admitting: Internal Medicine

## 2017-08-12 DIAGNOSIS — I7789 Other specified disorders of arteries and arterioles: Secondary | ICD-10-CM

## 2017-08-12 MED ORDER — IOPAMIDOL (ISOVUE-370) INJECTION 76%: 75.0000 mL | Freq: Once | INTRAVENOUS | Status: DC | PRN

## 2017-08-13 ENCOUNTER — Encounter (HOSPITAL_COMMUNITY): Payer: Self-pay

## 2017-08-13 DIAGNOSIS — F17290 Nicotine dependence, other tobacco product, uncomplicated: Secondary | ICD-10-CM | POA: Diagnosis not present

## 2017-08-13 DIAGNOSIS — I10 Essential (primary) hypertension: Secondary | ICD-10-CM | POA: Insufficient documentation

## 2017-08-13 DIAGNOSIS — Z79899 Other long term (current) drug therapy: Secondary | ICD-10-CM | POA: Insufficient documentation

## 2017-08-13 DIAGNOSIS — R079 Chest pain, unspecified: Secondary | ICD-10-CM | POA: Diagnosis not present

## 2017-08-13 LAB — CBC
HCT: 43.2 % (ref 39.0–52.0)
Hemoglobin: 14.6 g/dL (ref 13.0–17.0)
MCH: 27.4 pg (ref 26.0–34.0)
MCHC: 33.8 g/dL (ref 30.0–36.0)
MCV: 81.2 fL (ref 78.0–100.0)
PLATELETS: 224 10*3/uL (ref 150–400)
RBC: 5.32 MIL/uL (ref 4.22–5.81)
RDW: 13.6 % (ref 11.5–15.5)
WBC: 4.5 10*3/uL (ref 4.0–10.5)

## 2017-08-13 LAB — BASIC METABOLIC PANEL
Anion gap: 10 (ref 5–15)
BUN: 9 mg/dL (ref 6–20)
CALCIUM: 9.4 mg/dL (ref 8.9–10.3)
CO2: 26 mmol/L (ref 22–32)
CREATININE: 1.29 mg/dL — AB (ref 0.61–1.24)
Chloride: 102 mmol/L (ref 101–111)
GLUCOSE: 100 mg/dL — AB (ref 65–99)
Potassium: 4.1 mmol/L (ref 3.5–5.1)
Sodium: 138 mmol/L (ref 135–145)

## 2017-08-13 LAB — I-STAT TROPONIN, ED: TROPONIN I, POC: 0 ng/mL (ref 0.00–0.08)

## 2017-08-13 NOTE — ED Triage Notes (Addendum)
Patient complains of CP and neck tightness since am while at work. 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 Patient alert and oriented, NAD

## 2017-08-14 ENCOUNTER — Emergency Department (HOSPITAL_COMMUNITY): Payer: BLUE CROSS/BLUE SHIELD

## 2017-08-14 ENCOUNTER — Other Ambulatory Visit: Payer: Self-pay

## 2017-08-14 ENCOUNTER — Emergency Department (HOSPITAL_COMMUNITY)
Admission: EM | Admit: 2017-08-14 | Discharge: 2017-08-14 | Disposition: A | Discharge status: Home / Self Care | Payer: BLUE CROSS/BLUE SHIELD | Attending: Emergency Medicine | Admitting: Emergency Medicine

## 2017-08-14 DIAGNOSIS — R079 Chest pain, unspecified: Secondary | ICD-10-CM

## 2017-08-14 LAB — I-STAT TROPONIN, ED: Troponin i, poc: 0 ng/mL (ref 0.00–0.08)

## 2017-08-14 NOTE — ED Provider Notes (Signed)
[facility] Provider Note   CSN: [id number] Arrival date & time: 08/13/17  1811     History   Chief Complaint Chief Complaint  Patient presents with  . chest/neck pain    HPI Wells GuilesFoster L Hockenberry is a 52 y.o. male.  The history is provided by the patient and medical records.     52 year old male with history of arthritis, hiatal hernia, hypertension, BPH, sleep apnea, presenting to the ED with chest and right-sided neck pain.  Patient states he was working at home earlier today following away some papers when he got a sudden onset of central chest tightness and some right-sided neck pain.  He had no associated shortness of breath, diaphoresis, nausea, vomiting, dizziness, or weakness.  States this resolved after a few seconds.  States he honestly thinks he may have gotten worked up as he found out yesterday that he has an ascending aortic aneurysm measuring 7.3 cm.  He has scheduled follow-up on Tuesday with vascular surgery as well as cardiology but states he has been "on edge" since finding out this news.  States this was actually found incidentally as he had a CT scan to evaluate his prostate and they noticed an abdominal aneurysm and sent him for CT of the chest.  He denies any other known cardiac history.  He is not a smoker, occasional cigars on weekends but not regularly.  No significant family cardiac history.  Past Medical History:  Diagnosis Date  . Arthritis   . Hiatal hernia    small per ct 2016  . Hypertension   . Hypertrophy of prostate    mild  . OSA on CPAP    per pt moderate osa per study  . Wears glasses     There are no active problems to display for this patient.   Past Surgical History:  Procedure Laterality Date  . ELBOW SURGERY Right 1999  . KNEE ARTHROSCOPY Left 1995  . SHOULDER SURGERY Bilateral right 1992/  left 1987  . UMBILICAL HERNIA REPAIR  10/2015  . VASECTOMY Bilateral 06/10/2016   Procedure: VASECTOMY;   Surgeon: Malen GauzePatrick L McKenzie, MD;  [facility];  Service: Urology;  Laterality: Bilateral;       Home Medications    Prior to Admission medications   Medication Sig Start Date End Date Taking? Authorizing Provider  EPINEPHrine (EPIPEN 2-PAK) 0.3 mg/0.3 mL IJ SOAJ injection Inject into the muscle as needed.    [provider]  Multiple Vitamin (MULTIVITAMIN) tablet Take 1 tablet by mouth daily.    [provider]  valsartan-hydrochlorothiazide (DIOVAN-HCT) 80-12.5 MG tablet Take 1 tablet by mouth every morning.    [provider]    Family History No family history on file.  Social History Social History   Tobacco Use  . Smoking status: Current Some Day Smoker    Years: 0.00    Types: Cigars  . Smokeless tobacco: Never Used  . Tobacco comment: average cigar 2 per month  Substance Use Topics  . Alcohol use: Yes    Comment: OCCASIONAL  . Drug use: No     Allergies   Shellfish allergy   Review of Systems Review of Systems  Respiratory: Positive for chest tightness.   Cardiovascular: Positive for chest pain.  All other systems reviewed and are negative.    Physical Exam Updated Vital Signs BP (!) 129/99   Pulse (!) 56   Temp 97.9 F (36.6 C) (Oral)  Resp 16   Ht 6\' 2"  (1.88 m)   Wt 129.3 kg (285 lb)   SpO2 99%   BMI 36.59 kg/m   Physical Exam  Constitutional: He is oriented to person, place, and time. He appears well-developed and well-nourished.  HENT:  Head: Normocephalic and atraumatic.  Mouth/Throat: Oropharynx is clear and moist.  Eyes: Conjunctivae and EOM are normal. Pupils are equal, round, and reactive to light.  Neck: Normal range of motion.  Cardiovascular: Normal rate, regular rhythm and normal heart sounds.  RRR, no murmur  Pulmonary/Chest: Effort normal and breath sounds normal.  Abdominal: Soft. Bowel sounds are normal.  Musculoskeletal: Normal range of motion.  Neurological: He is  alert and oriented to person, place, and time.  Skin: Skin is warm and dry.  Psychiatric: He has a normal mood and affect.  Nursing note and vitals reviewed.    ED Treatments / Results  Labs (all labs ordered are listed, but only abnormal results are displayed) Labs Reviewed  BASIC METABOLIC PANEL - Abnormal; Notable for the following components:      Result Value   Glucose, Bld 100 (*)    Creatinine, Ser 1.29 (*)    All other components within normal limits  CBC  I-STAT TROPONIN, ED  I-STAT TROPONIN, ED    EKG  EKG Interpretation None       Radiology Dg Chest 2 View  Result Date: 08/14/2017 CLINICAL DATA:  Chest pain and neck tightness. 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 EXAM: CHEST  2 VIEW COMPARISON:  Chest CT 08/12/2017 FINDINGS: Radiographically, the sinus of Valsalva aneurysm is occult and not radiographically apparent. The aortic arch and descending aorta are unremarkable. The heart is normal in size. The lungs are clear with minimal atelectasis at the left base. IMPRESSION: No active cardiopulmonary disease. The sinus of Valsalva aneurysm best seen recent CT is not radiographically apparent. Electronically Signed   By: Tollie Eth M.D.   On: 08/14/2017 02:13   Ct Angio Chest Aorta W/cm &/or Wo/cm  Result Date: 08/12/2017 CLINICAL DATA:  Abnormal aorta noted on CT abdomen EXAM: CT ANGIOGRAPHY CHEST WITH CONTRAST TECHNIQUE: Multidetector CT imaging of the chest was performed using the standard protocol during bolus administration of intravenous contrast. Multiplanar CT image reconstructions and MIPs were obtained to evaluate the vascular anatomy. CONTRAST:  75 cc Isovue 370 COMPARISON:  None. FINDINGS: Cardiovascular: Maximal diameter of the ascending aorta at the sinus of Valsalva, sino-tubular junction, and ascending aorta are 7.3 cm, 3.9 cm, and 3.1 cm respectively. There is no evidence of dissection or intramural hematoma. Great vessels are nonaneurysmal  and patent. Left main coronary artery calcification is noted. There is mild dilatation of the right ventricle. There is no obvious acute pulmonary thromboembolism. Mediastinum/Nodes: No abnormal mediastinal adenopathy. Physiologic pericardial fluid. Normal thyroid. Small hiatal hernia. Lungs/Pleura: No pneumothorax. No pleural effusion. Tiny visceral pleural node in the right major fissure on image 93 of series 11. Upper Abdomen: There is a calcified lobulated cyst at the inferior aspect of the spleen that has a benign appearance. Musculoskeletal: There is no vertebral compression deformity. No definite acute rib fracture. Review of the MIP images confirms the above findings. IMPRESSION: Aneurysmal dilatation of the ascending aorta at the sinus of Valsalva measures 7.3 cm. There is no evidence of dissection or intramural hematoma. The differential diagnosis of the underlying path the physiology includes collagen vascular disease and infections such as Syphilis. Atherosclerosis is also in the differential, however there is  relatively little atherosclerotic disease noted within this patient. Electronically Signed   By: Jolaine Click M.D.   On: 08/12/2017 11:10    Procedures Procedures (including critical care time)  Medications Ordered in ED Medications - No data to display   Initial Impression / Assessment and Plan / ED Course  I have reviewed the triage vital signs and the nursing notes.  Pertinent labs & imaging results that were available during my care of the patient were reviewed by me and considered in my medical decision making (see chart for details).  52 y.o. M here with chest pain that resolved prior to my evaluation.  Has newly found 7.3cm ascending aortic aneurysm seen on CTA of chest on 08/12/17.  Admits he has been somewhat worried since receiving this news, has been looking a lot of things up on google.  EKG today without any acute ischemic changes.  No murmur noted on exam.  VSS.  Labs  overall reassuring.  CXR was obtained, no widened mediastinum or other cardiopulmonary findings.  Delta troponin remains negative.  At this time, no signs or symptoms that would suggest aneurysm has ruptured.  Patient remains stable here without any recurrence of chest pain.  He admits that he may have gotten a little anxious about his aneurysm which is understandable given this was just recently diagnosed and is quite sizable.  He has scheduled follow-up with cardiology as well as vascular surgery on Tuesday, 2 days from now.  He understands importance of monitoring his symptoms and return here immediately for any new or worsening symptoms including recurrent chest pain, shortness of breath, diaphoresis, nausea, vomiting, dizziness, or weakness.  Patient discharged home in stable condition.  Final Clinical Impressions(s) / ED Diagnoses   Final diagnoses:  Chest pain, unspecified type    ED Discharge Orders    None       Garlon Hatchet, PA-C 08/14/17 0521    Ward, Layla Maw, DO 08/14/17 [phone]

## 2017-08-14 NOTE — Discharge Instructions (Signed)
Keep an eye on your symptoms-- if you have any new/worsening symptoms like chest pain, shortness of breath, sweating, dizziness, weakness, etc. Return to the ED for new or worsening symptoms.

## 2017-08-15 ENCOUNTER — Emergency Department (HOSPITAL_COMMUNITY)
Admission: EM | Admit: 2017-08-15 | Discharge: 2017-08-15 | Disposition: A | Discharge status: Home / Self Care | Payer: BLUE CROSS/BLUE SHIELD | Attending: Emergency Medicine | Admitting: Emergency Medicine

## 2017-08-15 DIAGNOSIS — G4730 Sleep apnea, unspecified: Secondary | ICD-10-CM | POA: Insufficient documentation

## 2017-08-15 DIAGNOSIS — I10 Essential (primary) hypertension: Secondary | ICD-10-CM | POA: Insufficient documentation

## 2017-08-15 DIAGNOSIS — Z7982 Long term (current) use of aspirin: Secondary | ICD-10-CM | POA: Insufficient documentation

## 2017-08-15 DIAGNOSIS — M542 Cervicalgia: Secondary | ICD-10-CM | POA: Insufficient documentation

## 2017-08-15 DIAGNOSIS — R0789 Other chest pain: Secondary | ICD-10-CM | POA: Insufficient documentation

## 2017-08-15 DIAGNOSIS — K439 Ventral hernia without obstruction or gangrene: Secondary | ICD-10-CM | POA: Insufficient documentation

## 2017-08-15 DIAGNOSIS — F419 Anxiety disorder, unspecified: Secondary | ICD-10-CM | POA: Insufficient documentation

## 2017-08-15 DIAGNOSIS — R079 Chest pain, unspecified: Secondary | ICD-10-CM

## 2017-08-15 DIAGNOSIS — F17210 Nicotine dependence, cigarettes, uncomplicated: Secondary | ICD-10-CM | POA: Diagnosis not present

## 2017-08-15 LAB — CBC
HEMATOCRIT: 42.9 % (ref 39.0–52.0)
HEMOGLOBIN: 15 g/dL (ref 13.0–17.0)
MCH: 28.6 pg (ref 26.0–34.0)
MCHC: 35 g/dL (ref 30.0–36.0)
MCV: 81.7 fL (ref 78.0–100.0)
Platelets: 194 10*3/uL (ref 150–400)
RBC: 5.25 MIL/uL (ref 4.22–5.81)
RDW: 14 % (ref 11.5–15.5)
WBC: 3.8 10*3/uL — AB (ref 4.0–10.5)

## 2017-08-15 LAB — I-STAT TROPONIN, ED
TROPONIN I, POC: 0 ng/mL (ref 0.00–0.08)
Troponin i, poc: 0.01 ng/mL (ref 0.00–0.08)

## 2017-08-15 LAB — BASIC METABOLIC PANEL
ANION GAP: 10 (ref 5–15)
BUN: 7 mg/dL (ref 6–20)
CALCIUM: 9.2 mg/dL (ref 8.9–10.3)
CHLORIDE: 104 mmol/L (ref 101–111)
CO2: 24 mmol/L (ref 22–32)
CREATININE: 1.16 mg/dL (ref 0.61–1.24)
GFR calc non Af Amer: >60 mL/min (ref >60)
Glucose, Bld: 92 mg/dL (ref 65–99)
Potassium: 4 mmol/L (ref 3.5–5.1)
SODIUM: 138 mmol/L (ref 135–145)

## 2017-08-15 NOTE — ED Provider Notes (Signed)
[facility] Provider Note   CSN: [id number] Arrival date & time: 08/15/17  1355     History   Chief Complaint Chief Complaint  Patient presents with  . Chest Pain    HPI Matthew Hoffman is a 52 y.o. male.  HPI Pt presents to the ED with complaints of chest discomfort radiating to the left neck.  He had a similar episode a couple of days ago while at rest.  He was seen in the ED and released.  This morning he felt a pulling in his chest.  It then became heavier.  It went off and on for a couple of hours.  No shortness of breath.  NO nausea or vomiting.  He did get a little sweaty but the room was warm.  Pt was diagnosed with a thoracic aneurysm on a ct scan.  He is scheduled to see a vascular surgeon tomorrow.   He has been feeling anxious about this and is not sure if this is the problem. Right now he is feeling fine. Past Medical History:  Diagnosis Date  . Arthritis   . Hiatal hernia    small per ct 2016  . Hypertension   . Hypertrophy of prostate    mild  . OSA on CPAP    per pt moderate osa per study  . Wears glasses     Patient Active Problem List   Diagnosis Date Noted  . Abdominal wall hernia 08/15/2017  . Hypertension 08/15/2017  . Sleep apnea 08/15/2017    Past Surgical History:  Procedure Laterality Date  . ELBOW SURGERY Right 1999  . KNEE ARTHROSCOPY Left 1995  . SHOULDER SURGERY Bilateral right 1992/  left 1987  . UMBILICAL HERNIA REPAIR  10/2015  . VASECTOMY Bilateral 06/10/2016   Procedure: VASECTOMY;  Surgeon: Malen GauzePatrick L McKenzie, MD;  [facility];  Service: Urology;  Laterality: Bilateral;       Home Medications    Prior to Admission medications   Medication Sig Start Date End Date Taking? Authorizing Provider  aspirin EC 81 MG tablet Take 81 mg by mouth daily.   Yes [provider]  EPINEPHrine (EPIPEN 2-PAK) 0.3 mg/0.3 mL IJ SOAJ injection Inject into the muscle as  needed.   Yes [provider]  guaiFENesin (MUCINEX) 600 MG 12 hr tablet Take by mouth 2 (two) times daily as needed. Liquid 30 ml   Yes [provider]  Multiple Vitamin (MULTIVITAMIN) tablet Take 1 tablet by mouth daily.   Yes [provider]  tamsulosin (FLOMAX) 0.4 MG CAPS capsule Take 0.4 mg by mouth at bedtime. 08/08/17  Yes [provider]  valsartan-hydrochlorothiazide (DIOVAN-HCT) 160-12.5 MG tablet Take 1 tablet by mouth every morning.   Yes [provider]    Family History No family history on file.  Social History Social History   Tobacco Use  . Smoking status: Current Some Day Smoker    Years: 0.00    Types: Cigars  . Smokeless tobacco: Never Used  . Tobacco comment: average cigar 2 per month  Substance Use Topics  . Alcohol use: Yes    Comment: OCCASIONAL  . Drug use: No     Allergies   Shellfish allergy   Review of Systems Review of Systems  All other systems reviewed and are negative.    Physical Exam Updated Vital Signs BP 118/84   Pulse 73   Temp 98 F (36.7 C) (Oral)   Resp  15   SpO2 94%   Physical Exam  Constitutional: He appears well-developed and well-nourished. No distress.  HENT:  Head: Normocephalic and atraumatic.  Right Ear: External ear normal.  Left Ear: External ear normal.  Eyes: Conjunctivae are normal. Right eye exhibits no discharge. Left eye exhibits no discharge. No scleral icterus.  Neck: Neck supple. No tracheal deviation present.  Cardiovascular: Normal rate, regular rhythm and intact distal pulses.  Pulmonary/Chest: Effort normal and breath sounds normal. No stridor. No respiratory distress. He has no wheezes. He has no rales.  Abdominal: Soft. Bowel sounds are normal. He exhibits no distension. There is no tenderness. There is no rebound and no guarding.  Musculoskeletal: He exhibits no edema or tenderness.  Neurological: He is alert. He has normal strength. No cranial nerve  deficit (no facial droop, extraocular movements intact, no slurred speech) or sensory deficit. He exhibits normal muscle tone. He displays no seizure activity. Coordination normal.  Skin: Skin is warm and dry. No rash noted.  Psychiatric: He has a normal mood and affect.  Nursing note and vitals reviewed.    ED Treatments / Results  Labs (all labs ordered are listed, but only abnormal results are displayed) Labs Reviewed  CBC - Abnormal; Notable for the following components:      Result Value   WBC 3.8 (*)    All other components within normal limits  BASIC METABOLIC PANEL  I-STAT TROPONIN, ED  I-STAT TROPONIN, ED    EKG  EKG Interpretation  Date/Time:  Monday August 15 2017 13:56:02 EST Ventricular Rate:  61 PR Interval:    QRS Duration: 121 QT Interval:  449 QTC Calculation: 453 R Axis:   52 Text Interpretation:  Sinus rhythm Nonspecific intraventricular conduction delay Borderline T abnormalities, anterior leads No STEMI.  Confirmed by Alona Bene [phone]) on 08/15/2017 2:04:33 PM       Radiology Dg Chest 2 View  Result Date: 08/14/2017 CLINICAL DATA:  Chest pain and neck tightness. 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 EXAM: CHEST  2 VIEW COMPARISON:  Chest CT 08/12/2017 FINDINGS: Radiographically, the sinus of Valsalva aneurysm is occult and not radiographically apparent. The aortic arch and descending aorta are unremarkable. The heart is normal in size. The lungs are clear with minimal atelectasis at the left base. IMPRESSION: No active cardiopulmonary disease. The sinus of Valsalva aneurysm best seen recent CT is not radiographically apparent. Electronically Signed   By: Tollie Eth M.D.   On: 08/14/2017 02:13    Procedures Procedures (including critical care time)  Medications Ordered in ED Medications - No data to display   Initial Impression / Assessment and Plan / ED Course  I have reviewed the triage vital signs and the nursing  notes.  Pertinent labs & imaging results that were available during my care of the patient were reviewed by me and considered in my medical decision making (see chart for details).  Clinical Course as of Aug 15 1824  Mon Aug 15, 2017  1825 Pt remains pain free.  2nd troponin is normal  [JK]    Clinical Course User Index [JK] Linwood Dibbles, MD    Patient presented to the emergency room for complaints of chest pain.  Patient was recently diagnosed with a thoracic ascending aortic aneurysm on February 15.  Patient has been a little bit anxious about this understandably.  Patient presented to the emergency room after having some episodes of chest pain yesterday.  He presented to the ED again  after another episode of chest pain.  Patient has been comfortable in the ED.  He is not having any pain.  I doubt that this pain is related to aortic dissection or rupture considering him being asymptomatic right now.  Serial troponins are negative.  The symptoms are atypical for acute coronary syndrome.  Patient has scheduled follow-up to see cardiology as well as his vascular surgeon tomorrow.  I think this is reasonable and do not feel this time he requires hospitalization.  Patient understands he should return if he has any recurrent episodes and we can certainly just bring him in for observation.  Final Clinical Impressions(s) / ED Diagnoses   Final diagnoses:  Chest pain, unspecified type    ED Discharge Orders    None       Linwood Dibbles, MD 08/15/17 [phone]

## 2017-08-15 NOTE — Discharge Instructions (Signed)
Follow-up with the cardiologist and the vascular surgeon as planned, return to the emergency room for any recurrent episodes.

## 2017-08-15 NOTE — ED Notes (Signed)
Pt ambulated to RR .  

## 2017-08-15 NOTE — ED Triage Notes (Signed)
Pt arrives by [facility] for cp pain that radiates into neck, was seen here on 2/16 for similar. No pain at this time reports it has been coming and going. Pt took 324 MG asa this am.

## 2017-08-16 ENCOUNTER — Institutional Professional Consult (permissible substitution) (INDEPENDENT_AMBULATORY_CARE_PROVIDER_SITE_OTHER): Payer: BLUE CROSS/BLUE SHIELD | Admitting: Thoracic Surgery (Cardiothoracic Vascular Surgery)

## 2017-08-16 ENCOUNTER — Encounter: Payer: Self-pay | Admitting: Thoracic Surgery (Cardiothoracic Vascular Surgery)

## 2017-08-16 VITALS — BP 138/96 | HR 66 | Resp 20 | Ht 74.0 in | Wt 285.0 lb

## 2017-08-16 DIAGNOSIS — I712 Thoracic aortic aneurysm, without rupture, unspecified: Secondary | ICD-10-CM

## 2017-08-16 NOTE — Progress Notes (Signed)
PCP is Ralene Ok, MD Referring Provider is Ralene Ok, MD  Chief Complaint  Patient presents with  . 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  Mr. Manus is a 52 year old gentleman with a history of hypertension, arthritis, obstructive sleep apnea, surgery for diverticulitis, abdominal hernia repair, and BPH.  Recently he developed hematuria.  He was evaluated by urology.  A CT of the abdomen and pelvis was done to evaluate his hematuria.  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  Since learning of the aneurysm is had 2 episodes of chest discomfort.  He went to the emergency room on both occasions.  His cardiac enzymes were negative.  The first was on Saturday and the second episode was yesterday.  The pain was felt to be related to anxiety.  He has no family history of aneurysms.  He was told he had a heart murmur back in college when he was playing football.  He is not aware of any additional workup being done   Past Medical History:  Diagnosis Date  . Arthritis   . Hiatal hernia    small per ct 2016  . Hypertension   . Hypertrophy of prostate    mild  . OSA on CPAP    per pt moderate osa per study  . Wears glasses     Past Surgical History:  Procedure Laterality Date  . ELBOW SURGERY Right 1999  . KNEE ARTHROSCOPY Left 1995  . SHOULDER SURGERY Bilateral right 1992/  left 1987  . UMBILICAL HERNIA REPAIR  10/2015  . VASECTOMY Bilateral 06/10/2016   Procedure: VASECTOMY;  Surgeon: Malen Gauze, MD;  Location: [facility];  Service: Urology;  Laterality: Bilateral;    History reviewed. No pertinent family history.  Social History Social History   Tobacco Use  . Smoking status: Current Some Day Smoker    Years: 0.00    Types: Cigars  . Smokeless tobacco: Never Used  . Tobacco comment: average cigar 2 per month   Substance Use Topics  . Alcohol use: Yes    Comment: OCCASIONAL  . Drug use: No    Current Outpatient Medications  Medication Sig Dispense Refill  . aspirin EC 81 MG tablet Take 81 mg by mouth daily.    Marland Kitchen EPINEPHrine (EPIPEN 2-PAK) 0.3 mg/0.3 mL IJ SOAJ injection Inject into the muscle as needed.    Marland Kitchen guaiFENesin (MUCINEX) 600 MG 12 hr tablet Take by mouth 2 (two) times daily as needed. Liquid 30 ml    . Multiple Vitamin (MULTIVITAMIN) tablet Take 1 tablet by mouth daily.    . tamsulosin (FLOMAX) 0.4 MG CAPS capsule Take 0.4 mg by mouth at bedtime.  11  . valsartan-hydrochlorothiazide (DIOVAN-HCT) 160-12.5 MG tablet Take 1 tablet by mouth every morning.     No current facility-administered medications for this visit.     Allergies  Allergen Reactions  . Shellfish Allergy Hives and Swelling    Review of Systems  Constitutional: Negative for activity change, appetite change, chills, fever and unexpected weight change.  HENT: Negative for trouble swallowing and voice change.   Eyes: Negative for visual disturbance.  Respiratory: Negative for shortness of breath and wheezing.   Cardiovascular: Positive for chest pain (See HPI). Negative for leg swelling.  Genitourinary: Positive for hematuria. Negative for dysuria.  Musculoskeletal: Negative for arthralgias and myalgias.  Neurological:  Negative for dizziness and syncope.  Hematological: Negative for adenopathy. Does not bruise/bleed easily.  All other systems reviewed and are negative.   BP (!) 138/96   Pulse 66   Resp 20   Ht 6\' 2"  (1.88 m)   Wt 285 lb (129.3 kg)   SpO2 98% Comment: RA  BMI 36.59 kg/m  Physical Exam  Constitutional: He is oriented to person, place, and time. He appears well-developed and well-nourished. No distress.  HENT:  Head: Normocephalic and atraumatic.  Mouth/Throat: No oropharyngeal exudate.  Eyes: Conjunctivae and EOM are normal. No scleral icterus.  Neck: Neck supple. No thyromegaly present.   Cardiovascular: Normal rate and regular rhythm.  Murmur (2/6 systolic low pitched murmur right upper sternal border) heard. Pulmonary/Chest: Effort normal and breath sounds normal. No respiratory distress. He has no wheezes. He has no rales.  Abdominal: Soft. He exhibits no distension. There is no tenderness.  Musculoskeletal: He exhibits no edema or deformity.  Lymphadenopathy:    He has no cervical adenopathy.  Neurological: He is alert and oriented to person, place, and time. No cranial nerve deficit.  Motor intact with good strength  Skin: Skin is warm and dry.  Vitals reviewed.    Diagnostic Tests: CT ANGIOGRAPHY CHEST WITH CONTRAST  TECHNIQUE: Multidetector CT imaging of the chest was performed using the standard protocol during bolus administration of intravenous contrast. Multiplanar CT image reconstructions and MIPs were obtained to evaluate the vascular anatomy.  CONTRAST:  75 cc Isovue 370  COMPARISON:  None.  FINDINGS: Cardiovascular: Maximal diameter of the ascending aorta at the sinus of Valsalva, sino-tubular junction, and ascending aorta are 7.3 cm, 3.9 cm, and 3.1 cm respectively. There is no evidence of dissection or intramural hematoma. Great vessels are nonaneurysmal and patent. Left main coronary artery calcification is noted. There is mild dilatation of the right ventricle. There is no obvious acute pulmonary thromboembolism.  Mediastinum/Nodes: No abnormal mediastinal adenopathy. Physiologic pericardial fluid. Normal thyroid. Small hiatal hernia.  Lungs/Pleura: No pneumothorax. No pleural effusion. Tiny visceral pleural node in the right major fissure on image 93 of series 11.  Upper Abdomen: There is a calcified lobulated cyst at the inferior aspect of the spleen that has a benign appearance.  Musculoskeletal: There is no vertebral compression deformity. No definite acute rib fracture.  Review of the MIP images confirms the above  findings.  IMPRESSION: Aneurysmal dilatation of the ascending aorta at the sinus of Valsalva measures 7.3 cm. There is no evidence of dissection or intramural hematoma. The differential diagnosis of the underlying path the physiology includes collagen vascular disease and infections such as Syphilis. Atherosclerosis is also in the differential, however there is relatively little atherosclerotic disease noted within this patient.   Electronically Signed   By: Jolaine ClickArthur  Hoss M.D.   On: 08/12/2017 11:10 I personally reviewed the CT images and concur with the findings noted above.  There is a sinus of Valsalva aneurysm.  It appears there may be some calcification in the left main coronary as well.  The aneurysm was present but smaller on a CT of the abdomen and pelvis in 2016  Impression: Mr. Lillia MountainWilkins is a 52 year old gentleman with a past medical history significant for hypertension, obstructive sleep apnea, arthritis, and hematuria.  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  This does meet size criteria for repair.  I recommended to Mr. Smyers that we do a repair of his sinus of Valsalva aneurysm.  This will involve reconstruction of the aortic root.  In most cases the aortic valve can be preserved, but we need an echocardiogram to further evaluate the valve prior to surgery.  Coronary artery calcification-no history of coronary artery disease but there is some calcium particularly in the area of the left main.  He needs catheterization prior to repair of the sinus of Valsalva aneurysm.  Will refer to cardiology.  I had a long discussion with him regarding the proposed operation.  He understands the need for general anesthesia, and sternotomy incision,  the use of cardiopulmonary bypass, use of drainage tubes postoperatively, the expected hospital stay, and the overall recovery.  I informed him the indications, risks, benefits, and alternatives.  He understands the risks include, but are not limited to death, MI, DVT, PE, stroke, bleeding, possible need for transfusion, infection, cardiac arrhythmias, heart block requiring pacemaker placement, respiratory or renal failure, gastrointestinal complications, as well as the possibility of other reversible complications.  We did discuss the possibility of needing to do an aortic valve replacement.  He understands that there is no perfect valve substitute.  We discussed mechanical and tissue valves and the relative advantages and disadvantages regarding longevity and need for lifelong anticoagulation.  He would like at time to think over that issue.  Hematuria-resolved  Hypertension-reasonable control on current regimen  He has an appointment for his dental cleaning on Thursday.  Plan: Cardiology consult for echocardiogram and catheterization.  Repair of sinus of Valsalva aneurysm, possible aortic valve replacement be scheduled as soon as possible.  Loreli Slot, MD [facility] [phone]

## 2017-08-16 NOTE — H&P (View-Only) (Signed)
PCP is Ralene Ok, MD Referring Provider is Ralene Ok, MD  Chief Complaint  Patient presents with  . 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  Matthew Hoffman is a 52 year old gentleman with a history of hypertension, arthritis, obstructive sleep apnea, surgery for diverticulitis, abdominal hernia repair, and BPH.  Recently he developed hematuria.  He was evaluated by urology.  A CT of the abdomen and pelvis was done to evaluate his hematuria.  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  Since learning of the aneurysm is had 2 episodes of chest discomfort.  He went to the emergency room on both occasions.  His cardiac enzymes were negative.  The first was on Saturday and the second episode was yesterday.  The pain was felt to be related to anxiety.  He has no family history of aneurysms.  He was told he had a heart murmur back in college when he was playing football.  He is not aware of any additional workup being done   Past Medical History:  Diagnosis Date  . Arthritis   . Hiatal hernia    small per ct 2016  . Hypertension   . Hypertrophy of prostate    mild  . OSA on CPAP    per pt moderate osa per study  . Wears glasses     Past Surgical History:  Procedure Laterality Date  . ELBOW SURGERY Right 1999  . KNEE ARTHROSCOPY Left 1995  . SHOULDER SURGERY Bilateral right 1992/  left 1987  . UMBILICAL HERNIA REPAIR  10/2015  . VASECTOMY Bilateral 06/10/2016   Procedure: VASECTOMY;  Surgeon: Malen Gauze, MD;  Location: [facility];  Service: Urology;  Laterality: Bilateral;    History reviewed. No pertinent family history.  Social History Social History   Tobacco Use  . Smoking status: Current Some Day Smoker    Years: 0.00    Types: Cigars  . Smokeless tobacco: Never Used  . Tobacco comment: average cigar 2 per month   Substance Use Topics  . Alcohol use: Yes    Comment: OCCASIONAL  . Drug use: No    Current Outpatient Medications  Medication Sig Dispense Refill  . aspirin EC 81 MG tablet Take 81 mg by mouth daily.    Marland Kitchen EPINEPHrine (EPIPEN 2-PAK) 0.3 mg/0.3 mL IJ SOAJ injection Inject into the muscle as needed.    Marland Kitchen guaiFENesin (MUCINEX) 600 MG 12 hr tablet Take by mouth 2 (two) times daily as needed. Liquid 30 ml    . Multiple Vitamin (MULTIVITAMIN) tablet Take 1 tablet by mouth daily.    . tamsulosin (FLOMAX) 0.4 MG CAPS capsule Take 0.4 mg by mouth at bedtime.  11  . valsartan-hydrochlorothiazide (DIOVAN-HCT) 160-12.5 MG tablet Take 1 tablet by mouth every morning.     No current facility-administered medications for this visit.     Allergies  Allergen Reactions  . Shellfish Allergy Hives and Swelling    Review of Systems  Constitutional: Negative for activity change, appetite change, chills, fever and unexpected weight change.  HENT: Negative for trouble swallowing and voice change.   Eyes: Negative for visual disturbance.  Respiratory: Negative for shortness of breath and wheezing.   Cardiovascular: Positive for chest pain (See HPI). Negative for leg swelling.  Genitourinary: Positive for hematuria. Negative for dysuria.  Musculoskeletal: Negative for arthralgias and myalgias.  Neurological:  Negative for dizziness and syncope.  Hematological: Negative for adenopathy. Does not bruise/bleed easily.  All other systems reviewed and are negative.   BP (!) 138/96   Pulse 66   Resp 20   Ht 6\' 2"  (1.88 m)   Wt 285 lb (129.3 kg)   SpO2 98% Comment: RA  BMI 36.59 kg/m  Physical Exam  Constitutional: He is oriented to person, place, and time. He appears well-developed and well-nourished. No distress.  HENT:  Head: Normocephalic and atraumatic.  Mouth/Throat: No oropharyngeal exudate.  Eyes: Conjunctivae and EOM are normal. No scleral icterus.  Neck: Neck supple. No thyromegaly present.   Cardiovascular: Normal rate and regular rhythm.  Murmur (2/6 systolic low pitched murmur right upper sternal border) heard. Pulmonary/Chest: Effort normal and breath sounds normal. No respiratory distress. He has no wheezes. He has no rales.  Abdominal: Soft. He exhibits no distension. There is no tenderness.  Musculoskeletal: He exhibits no edema or deformity.  Lymphadenopathy:    He has no cervical adenopathy.  Neurological: He is alert and oriented to person, place, and time. No cranial nerve deficit.  Motor intact with good strength  Skin: Skin is warm and dry.  Vitals reviewed.    Diagnostic Tests: CT ANGIOGRAPHY CHEST WITH CONTRAST  TECHNIQUE: Multidetector CT imaging of the chest was performed using the standard protocol during bolus administration of intravenous contrast. Multiplanar CT image reconstructions and MIPs were obtained to evaluate the vascular anatomy.  CONTRAST:  75 cc Isovue 370  COMPARISON:  None.  FINDINGS: Cardiovascular: Maximal diameter of the ascending aorta at the sinus of Valsalva, sino-tubular junction, and ascending aorta are 7.3 cm, 3.9 cm, and 3.1 cm respectively. There is no evidence of dissection or intramural hematoma. Great vessels are nonaneurysmal and patent. Left main coronary artery calcification is noted. There is mild dilatation of the right ventricle. There is no obvious acute pulmonary thromboembolism.  Mediastinum/Nodes: No abnormal mediastinal adenopathy. Physiologic pericardial fluid. Normal thyroid. Small hiatal hernia.  Lungs/Pleura: No pneumothorax. No pleural effusion. Tiny visceral pleural node in the right major fissure on image 93 of series 11.  Upper Abdomen: There is a calcified lobulated cyst at the inferior aspect of the spleen that has a benign appearance.  Musculoskeletal: There is no vertebral compression deformity. No definite acute rib fracture.  Review of the MIP images confirms the above  findings.  IMPRESSION: Aneurysmal dilatation of the ascending aorta at the sinus of Valsalva measures 7.3 cm. There is no evidence of dissection or intramural hematoma. The differential diagnosis of the underlying path the physiology includes collagen vascular disease and infections such as Syphilis. Atherosclerosis is also in the differential, however there is relatively little atherosclerotic disease noted within this patient.   Electronically Signed   By: Jolaine ClickArthur  Hoss M.D.   On: 08/12/2017 11:10 I personally reviewed the CT images and concur with the findings noted above.  There is a sinus of Valsalva aneurysm.  It appears there may be some calcification in the left main coronary as well.  The aneurysm was present but smaller on a CT of the abdomen and pelvis in 2016  Impression: Matthew Hoffman is a 52 year old gentleman with a past medical history significant for hypertension, obstructive sleep apnea, arthritis, and hematuria.  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  This does meet size criteria for repair.  I recommended to Matthew Hoffman that we do a repair of his sinus of Valsalva aneurysm.  This will involve reconstruction of the aortic root.  In most cases the aortic valve can be preserved, but we need an echocardiogram to further evaluate the valve prior to surgery.  Coronary artery calcification-no history of coronary artery disease but there is some calcium particularly in the area of the left main.  He needs catheterization prior to repair of the sinus of Valsalva aneurysm.  Will refer to cardiology.  I had a long discussion with him regarding the proposed operation.  He understands the need for general anesthesia, and sternotomy incision,  the use of cardiopulmonary bypass, use of drainage tubes postoperatively, the expected hospital stay, and the overall recovery.  I informed him the indications, risks, benefits, and alternatives.  He understands the risks include, but are not limited to death, MI, DVT, PE, stroke, bleeding, possible need for transfusion, infection, cardiac arrhythmias, heart block requiring pacemaker placement, respiratory or renal failure, gastrointestinal complications, as well as the possibility of other reversible complications.  We did discuss the possibility of needing to do an aortic valve replacement.  He understands that there is no perfect valve substitute.  We discussed mechanical and tissue valves and the relative advantages and disadvantages regarding longevity and need for lifelong anticoagulation.  He would like at time to think over that issue.  Hematuria-resolved  Hypertension-reasonable control on current regimen  He has an appointment for his dental cleaning on Thursday.  Plan: Cardiology consult for echocardiogram and catheterization.  Repair of sinus of Valsalva aneurysm, possible aortic valve replacement be scheduled as soon as possible.  Loreli Slot, MD [facility] [phone]

## 2017-08-18 ENCOUNTER — Ambulatory Visit (INDEPENDENT_AMBULATORY_CARE_PROVIDER_SITE_OTHER): Payer: BLUE CROSS/BLUE SHIELD | Admitting: Cardiology

## 2017-08-18 ENCOUNTER — Encounter: Payer: Self-pay | Admitting: Cardiology

## 2017-08-18 VITALS — BP 130/70 | HR 74 | Ht 74.0 in | Wt 285.0 lb

## 2017-08-18 DIAGNOSIS — I10 Essential (primary) hypertension: Secondary | ICD-10-CM | POA: Diagnosis not present

## 2017-08-18 DIAGNOSIS — R072 Precordial pain: Secondary | ICD-10-CM

## 2017-08-18 DIAGNOSIS — Z01818 Encounter for other preprocedural examination: Secondary | ICD-10-CM | POA: Diagnosis not present

## 2017-08-18 NOTE — Progress Notes (Signed)
Referring- Andrey Spearman MD  Reason for referral-Preoperative evaluation prior to repair of thoracic aortic aneurysm  HPI: 52 year old male for preoperative evaluation prior to repair of thoracic aortic aneurysm at request of Andrey Spearman MD.  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  Atherosclerosis also noted in coronary arteries.  Cardiology asked to evaluate.  Patient has been seen in the emergency room twice in February with chest pain.  Troponins were normal.  Electrocardiograms showed sinus rhythm with nonspecific anterior T wave changes unchanged compared to previous.  Patient does not have exertional chest pain and feels his symptoms previously related to anxiety.  He denies dyspnea on exertion, orthopnea, PND, pedal edema, palpitations or syncope.  Current Outpatient Medications  Medication Sig Dispense Refill  . aspirin EC 81 MG tablet Take 81 mg by mouth daily.    Marland Kitchen EPINEPHrine (EPIPEN 2-PAK) 0.3 mg/0.3 mL IJ SOAJ injection Inject into the muscle as needed.    Marland Kitchen guaiFENesin (MUCINEX) 600 MG 12 hr tablet Take by mouth 2 (two) times daily as needed. Liquid 30 ml    . Multiple Vitamin (MULTIVITAMIN) tablet Take 1 tablet by mouth daily.    . tamsulosin (FLOMAX) 0.4 MG CAPS capsule Take 0.4 mg by mouth at bedtime.  11  . valsartan-hydrochlorothiazide (DIOVAN-HCT) 160-12.5 MG tablet Take 1 tablet by mouth every morning.     No current facility-administered medications for this visit.     Allergies  Allergen Reactions  . Shellfish Allergy Hives and Swelling     Past Medical History:  Diagnosis Date  . Arthritis   . Diverticulosis   . Hiatal hernia    small per ct 2016  . Hypertension   . Hypertrophy of prostate    mild  . OSA on CPAP    per pt moderate osa per study  . Wears glasses     Past Surgical History:  Procedure Laterality Date  . COLON RESECTION    . ELBOW SURGERY Right 1999  . KNEE ARTHROSCOPY Left 1995  . SHOULDER  SURGERY Bilateral right 1992/  left 1987  . UMBILICAL HERNIA REPAIR  10/2015  . VASECTOMY Bilateral 06/10/2016   Procedure: VASECTOMY;  Surgeon: Malen Gauze, MD;  Location: [facility];  Service: Urology;  Laterality: Bilateral;    Social History   Socioeconomic History  . Marital status: Divorced    Spouse name: Not on file  . Number of children: 2  . Years of education: Not on file  . Highest education level: Not on file  Social Needs  . Financial resource strain: Not on file  . Food insecurity - worry: Not on file  . Food insecurity - inability: Not on file  . Transportation needs - medical: Not on file  . Transportation needs - non-medical: Not on file  Occupational History    Comment: Counseling  Tobacco Use  . Smoking status: Current Some Day Smoker    Years: 0.00    Types: Cigars  . Smokeless tobacco: Never Used  . Tobacco comment: average cigar 2 per month  Substance and Sexual Activity  . Alcohol use: Yes    Comment: OCCASIONAL  . Drug use: No  . Sexual activity: Not on file  Other Topics Concern  . Not on file  Social History Narrative  . Not on file    Family History  Problem Relation Age of Onset  . Hypertension Mother   . COPD Father  ROS: no fevers or chills, productive cough, hemoptysis, dysphasia, odynophagia, melena, hematochezia, dysuria, hematuria, rash, seizure activity, orthopnea, PND, pedal edema, claudication. Remaining systems are negative.  Physical Exam:   Blood pressure 130/70, pulse 74, height 6\' 2"  (1.88 m), weight 285 lb (129.3 kg).  General:  Well developed/well nourished in NAD Skin warm/dry Patient not depressed No peripheral clubbing Back-normal HEENT-normal/normal eyelids Neck supple/normal carotid upstroke bilaterally; no bruits; no JVD; no thyromegaly chest - CTA/ normal expansion CV - RRR/normal S1 and S2; no murmurs, rubs or gallops;  PMI nondisplaced Abdomen -NT/ND, no HSM, no mass, + bowel  sounds, no bruit 2+ femoral pulses, no bruits Ext-no edema, chords, 2+ DP Neuro-grossly nonfocal   A/P  1 preoperative evaluation prior to thoracic aortic aneurysm repair-patient was noted to have coronary calcification on his previous CT.  He has had chest pain as well though likely anxiety related.  Dr. Dorris FetchHendrickson has requested cardiac catheterization prior to his aneurysm repair.  We discussed the risks and benefits including myocardial infarction, CVA and death and he agrees to proceed.  2 thoracic aortic aneurysm-patient will require repair.  We will arrange echocardiogram to assess aortic valve and rule out aortic insufficiency prior to TAA repair.  Further management per cardiothoracic surgery.  3 hypertension-blood pressure is controlled.  Continue present medications.    Olga MillersBrian Chrles Selley, MD

## 2017-08-18 NOTE — Patient Instructions (Signed)
   [facility] [address]  [place] [address] Dept: [phone] Loc: [phone]  Matthew Hoffman  08/18/2017  You are scheduled for a Cardiac Catheterization on Wednesday, February 27 with Dr. Verne Carrowhristopher Hoffman.  1. Please arrive [facility] the [facility] at [facility]: [address] [place], [place] [address] at 11:00 AM (two hours before your procedure to ensure your preparation). Free valet parking service is available.   Special note: Every effort is made to have your procedure done on time. Please understand that emergencies sometimes delay scheduled procedures.  2. Diet: Do not eat or drink anything after midnight prior to your procedure except sips of water to take medications.  3. Labs: Your physician recommends that you HAVE LAB WORK TODAY  4. Medication instructions in preparation for your procedure:  On the morning of your procedure, take your Aspirin and any morning medicines.  You may use sips of water.  5. Plan for one night stay--bring personal belongings. 6. Bring a current list of your medications and current insurance cards. 7. You MUST have a responsible person to drive you home. 8. Someone MUST be with you the first 24 hours after you arrive home or your discharge will be delayed. 9. Please wear clothes that are easy to get on and off and wear slip-on shoes.  Thank you for allowing us to care for you!   -- [facility]    Your physician has requested that you have an echocardiogram. Echocardiography is a painless test that uses sound waves to create images of your heart. It provides your doctor with information about the size and shape of your heart and how well your heart's chambers and valves are working. This procedure takes approximately one hour. There are no restrictions for this procedure.

## 2017-08-18 NOTE — H&P (View-Only) (Signed)
  Referring- Steve Hendrickson MD  Reason for referral-Preoperative evaluation prior to repair of thoracic aortic aneurysm  HPI: Matthew Hoffman for preoperative evaluation prior to repair of thoracic aortic aneurysm at request of Steve Hendrickson MD.  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  Atherosclerosis also noted in coronary arteries.  Cardiology asked to evaluate.  Patient has been seen in the emergency room twice in February with chest pain.  Troponins were normal.  Electrocardiograms showed sinus rhythm with nonspecific anterior T wave changes unchanged compared to previous.  Patient does not have exertional chest pain and feels his symptoms previously related to anxiety.  He denies dyspnea on exertion, orthopnea, PND, pedal edema, palpitations or syncope.  Current Outpatient Medications  Medication Sig Dispense Refill  . aspirin EC 81 MG tablet Take 81 mg by mouth daily.    . EPINEPHrine (EPIPEN 2-PAK) 0.3 mg/0.3 mL IJ SOAJ injection Inject into the muscle as needed.    . guaiFENesin (MUCINEX) 600 MG 12 hr tablet Take by mouth 2 (two) times daily as needed. Liquid 30 ml    . Multiple Vitamin (MULTIVITAMIN) tablet Take 1 tablet by mouth daily.    . tamsulosin (FLOMAX) 0.4 MG CAPS capsule Take 0.4 mg by mouth at bedtime.  11  . valsartan-hydrochlorothiazide (DIOVAN-HCT) 160-12.5 MG tablet Take 1 tablet by mouth every morning.     No current facility-administered medications for this visit.     Allergies  Allergen Reactions  . Shellfish Allergy Hives and Swelling     Past Medical History:  Diagnosis Date  . Arthritis   . Diverticulosis   . Hiatal hernia    small per ct 2016  . Hypertension   . Hypertrophy of prostate    mild  . OSA on CPAP    per pt moderate osa per study  . Wears glasses     Past Surgical History:  Procedure Laterality Date  . COLON RESECTION    . ELBOW SURGERY Right 1999  . KNEE ARTHROSCOPY Left 1995  . SHOULDER  SURGERY Bilateral right 1992/  left 1987  . UMBILICAL HERNIA REPAIR  10/2015  . VASECTOMY Bilateral 06/10/2016   Procedure: VASECTOMY;  Surgeon: Patrick L McKenzie, MD;  Location: [facility];  Service: Urology;  Laterality: Bilateral;    Social History   Socioeconomic History  . Marital status: Divorced    Spouse name: Not on file  . Number of children: 2  . Years of education: Not on file  . Highest education level: Not on file  Social Needs  . Financial resource strain: Not on file  . Food insecurity - worry: Not on file  . Food insecurity - inability: Not on file  . Transportation needs - medical: Not on file  . Transportation needs - non-medical: Not on file  Occupational History    Comment: Counseling  Tobacco Use  . Smoking status: Current Some Day Smoker    Years: 0.00    Types: Cigars  . Smokeless tobacco: Never Used  . Tobacco comment: average cigar 2 per month  Substance and Sexual Activity  . Alcohol use: Yes    Comment: OCCASIONAL  . Drug use: No  . Sexual activity: Not on file  Other Topics Concern  . Not on file  Social History Narrative  . Not on file    Family History  Problem Relation Age of Onset  . Hypertension Mother   . COPD Father       ROS: no fevers or chills, productive cough, hemoptysis, dysphasia, odynophagia, melena, hematochezia, dysuria, hematuria, rash, seizure activity, orthopnea, PND, pedal edema, claudication. Remaining systems are negative.  Physical Exam:   Blood pressure 130/70, pulse 74, height 6' 2" (1.88 m), weight 285 lb (129.3 kg).  General:  Well developed/well nourished in NAD Skin warm/dry Patient not depressed No peripheral clubbing Back-normal HEENT-normal/normal eyelids Neck supple/normal carotid upstroke bilaterally; no bruits; no JVD; no thyromegaly chest - CTA/ normal expansion CV - RRR/normal S1 and S2; no murmurs, rubs or gallops;  PMI nondisplaced Abdomen -NT/ND, no HSM, no mass, + bowel  sounds, no bruit 2+ femoral pulses, no bruits Ext-no edema, chords, 2+ DP Neuro-grossly nonfocal   A/P  1 preoperative evaluation prior to thoracic aortic aneurysm repair-patient was noted to have coronary calcification on his previous CT.  He has had chest pain as well though likely anxiety related.  Dr. Hendrickson has requested cardiac catheterization prior to his aneurysm repair.  We discussed the risks and benefits including myocardial infarction, CVA and death and he agrees to proceed.  2 thoracic aortic aneurysm-patient will require repair.  We will arrange echocardiogram to assess aortic valve and rule out aortic insufficiency prior to TAA repair.  Further management per cardiothoracic surgery.  3 hypertension-blood pressure is controlled.  Continue present medications.    Matthew Lasota, MD  

## 2017-08-19 ENCOUNTER — Other Ambulatory Visit: Payer: Self-pay

## 2017-08-19 ENCOUNTER — Ambulatory Visit (HOSPITAL_COMMUNITY): Payer: BLUE CROSS/BLUE SHIELD | Attending: Cardiovascular Disease

## 2017-08-19 DIAGNOSIS — Z720 Tobacco use: Secondary | ICD-10-CM | POA: Diagnosis not present

## 2017-08-19 DIAGNOSIS — I351 Nonrheumatic aortic (valve) insufficiency: Secondary | ICD-10-CM | POA: Diagnosis not present

## 2017-08-19 DIAGNOSIS — I712 Thoracic aortic aneurysm, without rupture: Secondary | ICD-10-CM | POA: Diagnosis not present

## 2017-08-19 DIAGNOSIS — R072 Precordial pain: Secondary | ICD-10-CM | POA: Insufficient documentation

## 2017-08-19 DIAGNOSIS — Z01818 Encounter for other preprocedural examination: Secondary | ICD-10-CM | POA: Insufficient documentation

## 2017-08-19 LAB — BASIC METABOLIC PANEL
BUN/Creatinine Ratio: 10 (ref 9–20)
BUN: 11 mg/dL (ref 6–24)
CALCIUM: 9.5 mg/dL (ref 8.7–10.2)
CO2: 22 mmol/L (ref 20–29)
Chloride: 100 mmol/L (ref 96–106)
Creatinine, Ser: 1.06 mg/dL (ref 0.76–1.27)
GFR calc Af Amer: 95 mL/min/{1.73_m2} (ref >59)
GFR, EST NON AFRICAN AMERICAN: 82 mL/min/{1.73_m2} (ref >59)
Glucose: 89 mg/dL (ref 65–99)
POTASSIUM: 4.5 mmol/L (ref 3.5–5.2)
Sodium: 141 mmol/L (ref 134–144)

## 2017-08-19 LAB — PROTIME-INR
INR: 1 (ref 0.8–1.2)
Prothrombin Time: 10.6 s (ref 9.1–12.0)

## 2017-08-19 LAB — CBC
HEMOGLOBIN: 14.9 g/dL (ref 13.0–17.7)
Hematocrit: 43.3 % (ref 37.5–51.0)
MCH: 26.6 pg (ref 26.6–33.0)
MCHC: 34.4 g/dL (ref 31.5–35.7)
MCV: 77 fL — ABNORMAL LOW (ref 79–97)
Platelets: 201 10*3/uL (ref 150–379)
RBC: 5.6 x10E6/uL (ref 4.14–5.80)
RDW: 13.8 % (ref 12.3–15.4)
WBC: 3.6 10*3/uL (ref 3.4–10.8)

## 2017-08-22 ENCOUNTER — Emergency Department (HOSPITAL_COMMUNITY)
Admission: EM | Admit: 2017-08-22 | Discharge: 2017-08-23 | Disposition: A | Discharge status: Home / Self Care | Payer: BLUE CROSS/BLUE SHIELD | Attending: Emergency Medicine | Admitting: Emergency Medicine

## 2017-08-22 ENCOUNTER — Telehealth: Payer: Self-pay | Admitting: *Deleted

## 2017-08-22 DIAGNOSIS — Z7982 Long term (current) use of aspirin: Secondary | ICD-10-CM | POA: Diagnosis not present

## 2017-08-22 DIAGNOSIS — I10 Essential (primary) hypertension: Secondary | ICD-10-CM | POA: Diagnosis not present

## 2017-08-22 DIAGNOSIS — F17290 Nicotine dependence, other tobacco product, uncomplicated: Secondary | ICD-10-CM | POA: Diagnosis not present

## 2017-08-22 DIAGNOSIS — R079 Chest pain, unspecified: Secondary | ICD-10-CM

## 2017-08-22 DIAGNOSIS — Z79899 Other long term (current) drug therapy: Secondary | ICD-10-CM | POA: Insufficient documentation

## 2017-08-22 NOTE — ED Notes (Signed)
In CH06 bed until room (C26) is ready

## 2017-08-22 NOTE — ED Provider Notes (Signed)
[facility] EMERGENCY DEPARTMENT Provider Note   CSN: [id number] Arrival date & time: 08/22/17  2338     History   Chief Complaint Chief Complaint  Patient presents with  . Chest Pain    HPI Matthew Hoffman is a 52 y.o. male.  Patient presents to the ER for evaluation of chest pain.  Patient is being worked up for chest pain as an outpatient.  He had incidental finding of large ascending aortic aneurysm recently.  Cardiology has scheduled him for an outpatient heart catheterization as part of his workup.  Patient had onset of left-sided chest pain tonight.  This was nonexertional.  He has had pain that radiated up into his neck, but did not have radiation tonight.  No shortness of breath, nausea or diaphoresis.  Patient received aspirin and nitroglycerin by EMS, has had some improvement.      Past Medical History:  Diagnosis Date  . Arthritis   . Diverticulosis   . Hiatal hernia    small per ct 2016  . Hypertension   . Hypertrophy of prostate    mild  . OSA on CPAP    per pt moderate osa per study  . Wears glasses     Patient Active Problem List   Diagnosis Date Noted  . Abdominal wall hernia 08/15/2017  . Hypertension 08/15/2017  . Sleep apnea 08/15/2017    Past Surgical History:  Procedure Laterality Date  . COLON RESECTION    . ELBOW SURGERY Right 1999  . KNEE ARTHROSCOPY Left 1995  . SHOULDER SURGERY Bilateral right 1992/  left 1987  . UMBILICAL HERNIA REPAIR  10/2015  . VASECTOMY Bilateral 06/10/2016   Procedure: VASECTOMY;  Surgeon: Malen Gauze, MD;  Location: [facility];  Service: Urology;  Laterality: Bilateral;       Home Medications    Prior to Admission medications   Medication Sig Start Date End Date Taking? Authorizing Provider  aspirin EC 81 MG tablet Take 81 mg by mouth daily.   Yes [provider]  EPINEPHrine (EPIPEN 2-PAK) 0.3 mg/0.3 mL IJ SOAJ injection Inject 0.3 mg into the muscle  as needed (for allergic reaction).    Yes [provider]  guaiFENesin (MUCINEX) 600 MG 12 hr tablet Take 600 mg by mouth 2 (two) times daily as needed for cough or to loosen phlegm.    Yes [provider]  Multiple Vitamin (MULTIVITAMIN) tablet Take 1 tablet by mouth daily.   Yes [provider]  tamsulosin (FLOMAX) 0.4 MG CAPS capsule Take 0.4 mg by mouth at bedtime. 08/08/17  Yes [provider]  valsartan-hydrochlorothiazide (DIOVAN-HCT) 160-12.5 MG tablet Take 1 tablet by mouth every morning.   Yes [provider]    Family History Family History  Problem Relation Age of Onset  . Hypertension Mother   . COPD Father     Social History Social History   Tobacco Use  . Smoking status: Current Some Day Smoker    Years: 0.00    Types: Cigars  . Smokeless tobacco: Never Used  . Tobacco comment: average cigar 2 per month  Substance Use Topics  . Alcohol use: Yes    Comment: OCCASIONAL  . Drug use: No     Allergies   Shellfish allergy   Review of Systems Review of Systems  Cardiovascular: Positive for chest pain.  All other systems reviewed and are negative.    Physical Exam Updated Vital Signs BP 122/84  Pulse (!) 49   Temp (!) 97.5 F (36.4 C) (Oral)   Resp 15   SpO2 95%   Physical Exam  Constitutional: He is oriented to person, place, and time. He appears well-developed and well-nourished. No distress.  HENT:  Head: Normocephalic and atraumatic.  Right Ear: Hearing normal.  Left Ear: Hearing normal.  Nose: Nose normal.  Mouth/Throat: Oropharynx is clear and moist and mucous membranes are normal.  Eyes: Conjunctivae and EOM are normal. Pupils are equal, round, and reactive to light.  Neck: Normal range of motion. Neck supple.  Cardiovascular: Regular rhythm, S1 normal and S2 normal. Exam reveals no gallop and no friction rub.  No murmur heard. Pulmonary/Chest: Effort normal and breath sounds normal. No  respiratory distress. He exhibits no tenderness.  Abdominal: Soft. Normal appearance and bowel sounds are normal. There is no hepatosplenomegaly. There is no tenderness. There is no rebound, no guarding, no tenderness at McBurney's point and negative Murphy's sign. No hernia.  Musculoskeletal: Normal range of motion.  Neurological: He is alert and oriented to person, place, and time. He has normal strength. No cranial nerve deficit or sensory deficit. Coordination normal. GCS eye subscore is 4. GCS verbal subscore is 5. GCS motor subscore is 6.  Skin: Skin is warm, dry and intact. No rash noted. No cyanosis.  Psychiatric: He has a normal mood and affect. His speech is normal and behavior is normal. Thought content normal.  Nursing note and vitals reviewed.    ED Treatments / Results  Labs (all labs ordered are listed, but only abnormal results are displayed) Labs Reviewed  CBC WITH DIFFERENTIAL/PLATELET  BASIC METABOLIC PANEL  I-STAT TROPONIN, ED  I-STAT TROPONIN, ED    EKG  EKG Interpretation  Date/Time:  Tuesday August 23 2017 00:26:41 EST Ventricular Rate:  57 PR Interval:    QRS Duration: 120 QT Interval:  455 QTC Calculation: 443 R Axis:   66 Text Interpretation:  Sinus rhythm Nonspecific intraventricular conduction delay Abnormal inferior Q waves No significant change since last tracing Confirmed by Gilda CreasePollina, Anushri Casalino J [phone]) on 08/23/2017 1:14:40 AM       Radiology Dg Chest 2 View  Result Date: 08/23/2017 CLINICAL DATA:  Mid upper chest pain EXAM: CHEST  2 VIEW COMPARISON:  08/14/2017 FINDINGS: Heart and mediastinal contours are within normal limits. No focal opacities or effusions. No acute bony abnormality. IMPRESSION: No active cardiopulmonary disease. Electronically Signed   By: Charlett NoseKevin  Dover M.D.   On: 08/23/2017 00:43    Procedures Procedures (including critical care time)  Medications Ordered in ED Medications - No data to display   Initial Impression  / Assessment and Plan / ED Course  I have reviewed the triage vital signs and the nursing notes.  Pertinent labs & imaging results that were available during my care of the patient were reviewed by me and considered in my medical decision making (see chart for details).     Patient presents to the emergency department for evaluation of chest pain.  Symptoms are atypical.  Patient has been seen in the ER multiple times for chest pain recently and it has been felt to be atypical, not requiring hospitalization or workup.  Patient has had an incidental finding of ascending thoracic aortic aneurysm.  He is scheduled for cardiac catheterization tomorrow for preoperative clearance before having repair of his aortic aneurysm.  Patient has had an unrevealing workup and his symptoms are atypical.  Discussed with Dr. Cristina GongVedre, on-call for cardiology.  He did  not recommend admitting the patient for urgent cardiac catheterization, felt the patient could be safely discharged and have his outpatient procedure tomorrow.  Final Clinical Impressions(s) / ED Diagnoses   Final diagnoses:  Nonspecific chest pain    ED Discharge Orders    None       Mckinsey Keagle, Canary Brim, MD 08/23/17 [phone]

## 2017-08-22 NOTE — Telephone Encounter (Signed)
Pt contacted [facility] for: Wednesday August 24, 2017 1PM Verified arrival [facility] and [facility] Main [facility] A/[facility] Tower at: 11 AM Nothing to eat or drink after midnight night before cath. Verified allergies in Epic.  Hold lisinopril-HCT AM of cath  Except hold medications  AM meds can be  taken pre-cath with sip of water including: ASA 81 mg AM of cath.   Confirmed patient has responsible person to drive home post procedure and observe patient for 24 hours: yes

## 2017-08-23 ENCOUNTER — Encounter: Payer: Self-pay | Admitting: Cardiology

## 2017-08-23 ENCOUNTER — Emergency Department (HOSPITAL_COMMUNITY): Payer: BLUE CROSS/BLUE SHIELD

## 2017-08-23 ENCOUNTER — Encounter (HOSPITAL_COMMUNITY): Payer: Self-pay

## 2017-08-23 ENCOUNTER — Other Ambulatory Visit: Payer: Self-pay

## 2017-08-23 LAB — BASIC METABOLIC PANEL
ANION GAP: 11 (ref 5–15)
BUN: 14 mg/dL (ref 6–20)
CALCIUM: 9.3 mg/dL (ref 8.9–10.3)
CO2: 24 mmol/L (ref 22–32)
Chloride: 105 mmol/L (ref 101–111)
Creatinine, Ser: 1.16 mg/dL (ref 0.61–1.24)
GFR calc Af Amer: >60 mL/min (ref >60)
GLUCOSE: 96 mg/dL (ref 65–99)
POTASSIUM: 4.3 mmol/L (ref 3.5–5.1)
SODIUM: 140 mmol/L (ref 135–145)

## 2017-08-23 LAB — CBC WITH DIFFERENTIAL/PLATELET
BASOS ABS: 0 10*3/uL (ref 0.0–0.1)
Basophils Relative: 1 %
EOS ABS: 0.7 10*3/uL (ref 0.0–0.7)
EOS PCT: 14 %
HCT: 42.7 % (ref 39.0–52.0)
Hemoglobin: 14.4 g/dL (ref 13.0–17.0)
Lymphocytes Relative: 42 %
Lymphs Abs: 2.1 10*3/uL (ref 0.7–4.0)
MCH: 27.5 pg (ref 26.0–34.0)
MCHC: 33.7 g/dL (ref 30.0–36.0)
MCV: 81.5 fL (ref 78.0–100.0)
MONO ABS: 0.4 10*3/uL (ref 0.1–1.0)
Monocytes Relative: 8 %
Neutro Abs: 1.7 10*3/uL (ref 1.7–7.7)
Neutrophils Relative %: 35 %
PLATELETS: 197 10*3/uL (ref 150–400)
RBC: 5.24 MIL/uL (ref 4.22–5.81)
RDW: 13.5 % (ref 11.5–15.5)
WBC: 4.9 10*3/uL (ref 4.0–10.5)

## 2017-08-23 LAB — I-STAT TROPONIN, ED
TROPONIN I, POC: 0.01 ng/mL (ref 0.00–0.08)
TROPONIN I, POC: 0.01 ng/mL (ref 0.00–0.08)

## 2017-08-23 NOTE — Telephone Encounter (Signed)
New message    Patient is requesting a call from Dr Jens Somrenshaw. Patient states he needs a better understanding about scheduled heart cath that was already explained by nurse.

## 2017-08-23 NOTE — ED Triage Notes (Signed)
Pt coming from home by [facility]. Pt c/o left sided chest pain with out radiation. Pain was a 5/10 with a dull aching feeling. Pt states that a last week pt came in for chest pain that radiated to the neck and jaw, pt was schedule for cath this Wednesday. Pt took 324asa at home prior to ems arrival. Ems gave 2 nitro's and brought pt pain to 2/10.

## 2017-08-23 NOTE — ED Notes (Signed)
PT states understanding of care given, follow up care, and medication prescribed. PT ambulated from ED to car with a steady gait. 

## 2017-08-23 NOTE — Telephone Encounter (Signed)
This encounter was created in error - please disregard.

## 2017-08-24 ENCOUNTER — Ambulatory Visit (HOSPITAL_COMMUNITY)
Admission: RE | Disposition: A | Discharge status: Home / Self Care | Payer: Self-pay | Source: Ambulatory Visit | Attending: Cardiovascular Disease

## 2017-08-24 ENCOUNTER — Ambulatory Visit (HOSPITAL_COMMUNITY)
Admission: RE | Admit: 2017-08-24 | Discharge: 2017-08-24 | Disposition: A | Discharge status: Home / Self Care | Payer: BLUE CROSS/BLUE SHIELD | Source: Ambulatory Visit | Attending: Cardiovascular Disease | Admitting: Cardiovascular Disease

## 2017-08-24 ENCOUNTER — Encounter: Payer: BLUE CROSS/BLUE SHIELD | Admitting: Surgery

## 2017-08-24 DIAGNOSIS — M1990 Unspecified osteoarthritis, unspecified site: Secondary | ICD-10-CM | POA: Insufficient documentation

## 2017-08-24 DIAGNOSIS — R072 Precordial pain: Secondary | ICD-10-CM

## 2017-08-24 DIAGNOSIS — F17290 Nicotine dependence, other tobacco product, uncomplicated: Secondary | ICD-10-CM | POA: Insufficient documentation

## 2017-08-24 DIAGNOSIS — G4733 Obstructive sleep apnea (adult) (pediatric): Secondary | ICD-10-CM | POA: Diagnosis not present

## 2017-08-24 DIAGNOSIS — I712 Thoracic aortic aneurysm, without rupture, unspecified: Secondary | ICD-10-CM

## 2017-08-24 DIAGNOSIS — Z7982 Long term (current) use of aspirin: Secondary | ICD-10-CM | POA: Diagnosis not present

## 2017-08-24 DIAGNOSIS — Z01818 Encounter for other preprocedural examination: Secondary | ICD-10-CM

## 2017-08-24 DIAGNOSIS — I10 Essential (primary) hypertension: Secondary | ICD-10-CM | POA: Insufficient documentation

## 2017-08-24 HISTORY — PX: RIGHT/LEFT HEART CATH AND CORONARY ANGIOGRAPHY: CATH118266

## 2017-08-24 LAB — POCT I-STAT 3, ART BLOOD GAS (G3+)
ACID-BASE DEFICIT: 1 mmol/L (ref 0.0–2.0)
BICARBONATE: 24.8 mmol/L (ref 20.0–28.0)
O2 Saturation: 93 %
PH ART: 7.362 (ref 7.350–7.450)
TCO2: 26 mmol/L (ref 22–32)
pCO2 arterial: 43.7 mmHg (ref 32.0–48.0)
pO2, Arterial: 70 mmHg — ABNORMAL LOW (ref 83.0–108.0)

## 2017-08-24 LAB — POCT I-STAT 3, VENOUS BLOOD GAS (G3P V)
ACID-BASE DEFICIT: 2 mmol/L (ref 0.0–2.0)
Bicarbonate: 23.1 mmol/L (ref 20.0–28.0)
O2 SAT: 72 %
TCO2: 24 mmol/L (ref 22–32)
pCO2, Ven: 41.2 mmHg — ABNORMAL LOW (ref 44.0–60.0)
pH, Ven: 7.358 (ref 7.250–7.430)
pO2, Ven: 40 mmHg (ref 32.0–45.0)

## 2017-08-24 SURGERY — RIGHT/LEFT HEART CATH AND CORONARY ANGIOGRAPHY
Anesthesia: LOCAL

## 2017-08-24 MED ORDER — SODIUM CHLORIDE 0.9 % WEIGHT BASED INFUSION
3.0000 mL/kg/h | INTRAVENOUS | Status: AC
  Administered 2017-08-24: 3 mL/kg/h via INTRAVENOUS

## 2017-08-24 MED ORDER — SODIUM CHLORIDE 0.9 % WEIGHT BASED INFUSION: 1.0000 mL/kg/h | INTRAVENOUS | Status: DC

## 2017-08-24 MED ORDER — ASPIRIN 81 MG PO CHEW
81.0000 mg | CHEWABLE_TABLET | ORAL | Status: AC
  Administered 2017-08-24: 81 mg via ORAL

## 2017-08-24 MED ORDER — SODIUM CHLORIDE 0.9 % IV SOLN: 250.0000 mL | INTRAVENOUS | Status: DC | PRN

## 2017-08-24 MED ORDER — FENTANYL CITRATE (PF) 100 MCG/2ML IJ SOLN
INTRAMUSCULAR | Status: AC
  Filled 2017-08-24: qty 2

## 2017-08-24 MED ORDER — SODIUM CHLORIDE 0.9 % IV SOLN: INTRAVENOUS | Status: AC

## 2017-08-24 MED ORDER — SODIUM CHLORIDE 0.9% FLUSH: 3.0000 mL | Freq: Two times a day (BID) | INTRAVENOUS | Status: DC

## 2017-08-24 MED ORDER — VERAPAMIL HCL 2.5 MG/ML IV SOLN
INTRAVENOUS | Status: AC
  Filled 2017-08-24: qty 2

## 2017-08-24 MED ORDER — SODIUM CHLORIDE 0.9% FLUSH: 3.0000 mL | INTRAVENOUS | Status: DC | PRN

## 2017-08-24 MED ORDER — ASPIRIN 81 MG PO CHEW
CHEWABLE_TABLET | ORAL | Status: AC
  Administered 2017-08-24: 81 mg via ORAL
  Filled 2017-08-24: qty 1

## 2017-08-24 MED ORDER — IOPAMIDOL (ISOVUE-370) INJECTION 76%
INTRAVENOUS | Status: AC
  Filled 2017-08-24: qty 100

## 2017-08-24 MED ORDER — HEPARIN SODIUM (PORCINE) 1000 UNIT/ML IJ SOLN
INTRAMUSCULAR | Status: AC
  Filled 2017-08-24: qty 1

## 2017-08-24 MED ORDER — HEPARIN (PORCINE) IN NACL 2-0.9 UNIT/ML-% IJ SOLN
INTRAMUSCULAR | Status: AC | PRN
  Administered 2017-08-24 (×2): 500 mL

## 2017-08-24 MED ORDER — HEPARIN SODIUM (PORCINE) 1000 UNIT/ML IJ SOLN
INTRAMUSCULAR | Status: DC | PRN
  Administered 2017-08-24: 6000 [IU] via INTRAVENOUS

## 2017-08-24 MED ORDER — FENTANYL CITRATE (PF) 100 MCG/2ML IJ SOLN
INTRAMUSCULAR | Status: DC | PRN
  Administered 2017-08-24: 50 ug via INTRAVENOUS

## 2017-08-24 MED ORDER — HEPARIN (PORCINE) IN NACL 2-0.9 UNIT/ML-% IJ SOLN
INTRAMUSCULAR | Status: AC
  Filled 2017-08-24: qty 1000

## 2017-08-24 MED ORDER — MIDAZOLAM HCL 2 MG/2ML IJ SOLN
INTRAMUSCULAR | Status: DC | PRN
  Administered 2017-08-24: 2 mg via INTRAVENOUS

## 2017-08-24 MED ORDER — MIDAZOLAM HCL 2 MG/2ML IJ SOLN
INTRAMUSCULAR | Status: AC
  Filled 2017-08-24: qty 2

## 2017-08-24 MED ORDER — HEPARIN (PORCINE) IN NACL 2-0.9 UNIT/ML-% IJ SOLN
INTRAMUSCULAR | Status: DC | PRN
  Administered 2017-08-24: 10 mL via INTRA_ARTERIAL

## 2017-08-24 MED ORDER — LIDOCAINE HCL (PF) 1 % IJ SOLN
INTRAMUSCULAR | Status: DC | PRN
  Administered 2017-08-24 (×2): 2 mL

## 2017-08-24 MED ORDER — IOPAMIDOL (ISOVUE-370) INJECTION 76%
INTRAVENOUS | Status: DC | PRN
  Administered 2017-08-24: 90 mL

## 2017-08-24 SURGICAL SUPPLY — 13 items
CATH BALLN WEDGE 5F 110CM (CATHETERS) ×2 IMPLANT
CATH INFINITI 5F JL4 125CM (CATHETERS) ×2 IMPLANT
CATH INFINITI 5FR AL3 (CATHETERS) ×2 IMPLANT
CATH INFINITI 5FR JR4 125CM (CATHETERS) ×2 IMPLANT
DEVICE RAD COMP TR BAND LRG (VASCULAR PRODUCTS) ×2 IMPLANT
GLIDESHEATH SLEND SS 6F .021 (SHEATH) ×2 IMPLANT
GUIDEWIRE INQWIRE 1.5J.035X260 (WIRE) ×1 IMPLANT
INQWIRE 1.5J .035X260CM (WIRE) ×2
KIT HEART LEFT (KITS) ×2 IMPLANT
PACK CARDIAC CATHETERIZATION (CUSTOM PROCEDURE TRAY) ×2 IMPLANT
SHEATH GLIDE SLENDER 4/5FR (SHEATH) ×2 IMPLANT
TRANSDUCER W/STOPCOCK (MISCELLANEOUS) ×2 IMPLANT
TUBING CIL FLEX 10 FLL-RA (TUBING) ×2 IMPLANT

## 2017-08-24 NOTE — Research (Signed)
CADFEM Informed Consent   Subject Name: Matthew Hoffman  Subject met inclusion and exclusion criteria.  The informed consent form, study requirements and expectations were reviewed with the subject and questions and concerns were addressed prior to the signing of the consent form.  The subject verbalized understanding of the trail requirements.  The subject agreed to participate in the CADFEM trial and signed the informed consent.  The informed consent was obtained prior to performance of any protocol-specific procedures for the subject.  A copy of the signed informed consent was given to the subject and a copy was placed in the subject's medical record.  [name] 08/24/2017, 12:28 PM

## 2017-08-24 NOTE — Interval H&P Note (Signed)
History and Physical Interval Note:  08/24/2017 1:30 PM  Matthew Hoffman  has presented today for cardiac cath with the diagnosis of ascending aortic aneurysm, pre-op to exclude obstructive CAD. The various methods of treatment have been discussed with the patient and family. After consideration of risks, benefits and other options for treatment, the patient has consented to  Procedure(s): LEFT HEART CATH AND CORONARY ANGIOGRAPHY (N/A) as a surgical intervention .  The patient's history has been reviewed, patient examined, no change in status, stable for surgery.  I have reviewed the patient's chart and labs.  Questions were answered to the patient's satisfaction.    Cath Lab Visit (complete for each Cath Lab visit)  Clinical Evaluation Leading to the Procedure:   ACS: No.  Non-ACS:    Anginal Classification: CCS II  Anti-ischemic medical therapy: No Therapy  Non-Invasive Test Results: No non-invasive testing performed  Prior CABG: No previous CABG         Verne Carrowhristopher McAlhany

## 2017-08-24 NOTE — Discharge Instructions (Signed)

## 2017-08-25 ENCOUNTER — Encounter (HOSPITAL_COMMUNITY): Payer: Self-pay | Admitting: Cardiovascular Disease

## 2017-08-25 MED FILL — Heparin Sodium (Porcine) 2 Unit/ML in Sodium Chloride 0.9%: INTRAMUSCULAR | Qty: 1000 | Status: AC

## 2017-08-26 ENCOUNTER — Other Ambulatory Visit: Payer: Self-pay | Admitting: *Deleted

## 2017-08-26 DIAGNOSIS — Q2549 Other congenital malformations of aorta: Secondary | ICD-10-CM

## 2017-09-05 NOTE — Pre-Procedure Instructions (Signed)
Matthew Hoffman  09/05/2017      Your procedure is scheduled on Thursday February 14.  [facility] at 6:00 A.M.  Call this number if you have problems the morning of surgery:  [phone]   Remember:  Do not eat food or drink liquids after midnight.  Take these medicines the morning of surgery with A SIP OF WATER: NONE  7 days prior to surgery STOP taking any Aleve, Naproxen, Ibuprofen, Motrin, Advil, Goody's, BC's, all herbal medications, fish oil, and all vitamins  **Follow your surgeon's instructions on stopping Aspirin. If no instructions given, please call your surgeon's office**    Do not wear jewelry  Do not wear lotions, powders, or colognes, or deodorant.  Do not shave 48 hours prior to surgery.  Men may shave face and neck.  Do not bring valuables to the hospital.  [facility] is not responsible for any belongings or valuables.  Contacts, dentures or bridgework may not be worn into surgery.  Leave your suitcase in the car.  After surgery it may be brought to your room.  For patients admitted to the hospital, discharge time will be determined by your treatment team.  Patients discharged the day of surgery will not be allowed to drive home.   Special instructions:     [place]- Preparing For Surgery  Before surgery, you can play an important role. Because skin is not sterile, your skin needs to be as free of germs as possible. You can reduce the number of germs on your skin by washing with CHG (chlorahexidine gluconate) Soap before surgery.  CHG is an antiseptic cleaner which kills germs and bonds with the skin to continue killing germs even after washing.  Please do not use if you have an allergy to CHG or antibacterial soaps. If your skin becomes reddened/irritated stop using the CHG.  Do not shave (including legs and underarms) for at least 48 hours prior to first CHG shower. It is OK to shave your face.  Please follow these  instructions carefully.   1. Shower the NIGHT BEFORE SURGERY and the MORNING OF SURGERY with CHG.   2. If you chose to wash your hair, wash your hair first as usual with your normal shampoo.  3. After you shampoo, rinse your hair and body thoroughly to remove the shampoo.  4. Use CHG as you would any other liquid soap. You can apply CHG directly to the skin and wash gently with a scrungie or a clean washcloth.   5. Apply the CHG Soap to your body ONLY FROM THE NECK DOWN.  Do not use on open wounds or open sores. Avoid contact with your eyes, ears, mouth and genitals (private parts). Wash Face and genitals (private parts)  with your normal soap.  6. Wash thoroughly, paying special attention to the area where your surgery will be performed.  7. Thoroughly rinse your body with warm water from the neck down.  8. DO NOT shower/wash with your normal soap after using and rinsing off the CHG Soap.  9. Pat yourself dry with a CLEAN TOWEL.  10. Wear CLEAN PAJAMAS to bed the night before surgery, wear comfortable clothes the morning of surgery  11. Place CLEAN SHEETS on your bed the night of your first shower and DO NOT SLEEP WITH PETS.    Day of Surgery: Do not apply any deodorants/lotions. Please wear clean clothes to the hospital/surgery center.  Please read over the following fact sheets that you were given. Coughing and Deep Breathing, MRSA Information and Surgical Site Infection Prevention

## 2017-09-05 NOTE — Progress Notes (Addendum)
PCP: Salome Spottedoy Moriera, MD  Cardiologist: Olga MillersBrian Crenshaw, MD  EKG: 08/26/17 in EPIC  Stress test: pt denies  ECHO: 08/19/17 in EPIC  Cardiac Cath:08/24/17 in EPIC  Chest x-ray: obtained today  Patient is not scheduled for pulmonary function testing today nor is it listed on his appointments from [facility].  Left message for Matthew Bumpyan Brooks, RN informing her of this and will try her again before the patient leaves his appointment.  Called and spoke with Matthew Bumpyan Brooks RN-pt will not have pulmonary function testing because his aneurysm is so large.  He is to continue aspirin up until the day of surgery but not on the day of surgery.  I informed patient of this information and he verbalized understanding

## 2017-09-05 NOTE — Pre-Procedure Instructions (Signed)
Wells GuilesFoster L Manship  09/05/2017      Your procedure is scheduled on September 08, 2017.  [facility]:00 [facility].M.  Call this number if you have problems the morning of surgery:  [phone]   Remember:  Do not eat food or drink liquids after midnight.  Take these medicines the morning of surgery with A SIP OF WATER: NONE  7 days prior to surgery STOP taking any Aleve, Naproxen, Ibuprofen, Motrin, Advil, Goody's, BC's, all herbal medications, fish oil, and all vitamins  **Follow your surgeon's instructions on stopping Aspirin. If no instructions given, please call your surgeon's office**    Do not wear jewelry  Do not wear lotions, powders, or colognes, or deodorant.  Do not shave 48 hours prior to surgery.  Men may shave face and neck.  Do not bring valuables to the hospital.  [facility] not [facility] for any belongings or valuables.  Contacts, dentures or bridgework may not be worn into surgery.  Leave your suitcase in the car.  After surgery it may be brought to your room.  For patients admitted to the hospital, discharge time will be determined by your treatment team.  Patients discharged the day of surgery will not be allowed to drive home.   Special instructions:     [place]- Preparing For Surgery  Before surgery, you can play an important role. Because skin is not sterile, your skin needs to be as free of germs as possible. You can reduce the number of germs on your skin by washing with CHG (chlorahexidine gluconate) Soap before surgery.  CHG is an antiseptic cleaner which kills germs and bonds with the skin to continue killing germs even after washing.  Please do not use if you have an allergy to CHG or antibacterial soaps. If your skin becomes reddened/irritated stop using the CHG.  Do not shave (including legs and underarms) for at least 48 hours prior to first CHG shower. It is OK to shave your face.  Please follow these  instructions carefully.   1. Shower the NIGHT BEFORE SURGERY and the MORNING OF SURGERY with CHG.   2. If you chose to wash your hair, wash your hair first as usual with your normal shampoo.  3. After you shampoo, rinse your hair and body thoroughly to remove the shampoo.  4. Use CHG as you would any other liquid soap. You can apply CHG directly to the skin and wash gently with a scrungie or a clean washcloth.   5. Apply the CHG Soap to your body ONLY FROM THE NECK DOWN.  Do not use on open wounds or open sores. Avoid contact with your eyes, ears, mouth and genitals (private parts). Wash Face and genitals (private parts)  with your normal soap.  6. Wash thoroughly, paying special attention to the area where your surgery will be performed.  7. Thoroughly rinse your body with warm water from the neck down.  8. DO NOT shower/wash with your normal soap after using and rinsing off the CHG Soap.  9. Pat yourself dry with a CLEAN TOWEL.  10. Wear CLEAN PAJAMAS to bed the night before surgery, wear comfortable clothes the morning of surgery  11. Place CLEAN SHEETS on your bed the night of your first shower and DO NOT SLEEP WITH PETS.  Day of Surgery: Do not apply any deodorants/lotions. Please wear clean clothes to the hospital/surgery center.     Please read over  the following fact sheets that you were given. Coughing and Deep Breathing, MRSA Information and Surgical Site Infection Prevention

## 2017-09-06 ENCOUNTER — Encounter (HOSPITAL_COMMUNITY): Payer: Self-pay

## 2017-09-06 ENCOUNTER — Other Ambulatory Visit: Payer: Self-pay

## 2017-09-06 ENCOUNTER — Encounter (HOSPITAL_COMMUNITY)
Admission: RE | Admit: 2017-09-06 | Discharge: 2017-09-06 | Disposition: A | Discharge status: Home / Self Care | Payer: BLUE CROSS/BLUE SHIELD | Source: Ambulatory Visit | Attending: Thoracic Surgery (Cardiothoracic Vascular Surgery) | Admitting: Thoracic Surgery (Cardiothoracic Vascular Surgery)

## 2017-09-06 ENCOUNTER — Ambulatory Visit (HOSPITAL_BASED_OUTPATIENT_CLINIC_OR_DEPARTMENT_OTHER)
Admission: RE | Admit: 2017-09-06 | Discharge: 2017-09-06 | Disposition: A | Discharge status: Home / Self Care | Payer: BLUE CROSS/BLUE SHIELD | Source: Ambulatory Visit | Attending: Thoracic Surgery (Cardiothoracic Vascular Surgery) | Admitting: Thoracic Surgery (Cardiothoracic Vascular Surgery)

## 2017-09-06 DIAGNOSIS — Z01810 Encounter for preprocedural cardiovascular examination: Secondary | ICD-10-CM | POA: Insufficient documentation

## 2017-09-06 DIAGNOSIS — Q2549 Other congenital malformations of aorta: Secondary | ICD-10-CM

## 2017-09-06 DIAGNOSIS — Q2543 Congenital aneurysm of aorta: Secondary | ICD-10-CM | POA: Diagnosis not present

## 2017-09-06 DIAGNOSIS — Z01818 Encounter for other preprocedural examination: Secondary | ICD-10-CM

## 2017-09-06 DIAGNOSIS — Z01812 Encounter for preprocedural laboratory examination: Secondary | ICD-10-CM

## 2017-09-06 LAB — BLOOD GAS, ARTERIAL
Acid-Base Excess: 1.6 mmol/L (ref 0.0–2.0)
Bicarbonate: 25.6 mmol/L (ref 20.0–28.0)
DRAWN BY: 421801
FIO2: 21
O2 Saturation: 98 %
PH ART: 7.424 (ref 7.350–7.450)
Patient temperature: 98.6
pCO2 arterial: 39.8 mmHg (ref 32.0–48.0)
pO2, Arterial: 117 mmHg — ABNORMAL HIGH (ref 83.0–108.0)

## 2017-09-06 LAB — CBC
HCT: 43.5 % (ref 39.0–52.0)
Hemoglobin: 14.8 g/dL (ref 13.0–17.0)
MCH: 27.5 pg (ref 26.0–34.0)
MCHC: 34 g/dL (ref 30.0–36.0)
MCV: 80.7 fL (ref 78.0–100.0)
Platelets: 200 K/uL (ref 150–400)
RBC: 5.39 MIL/uL (ref 4.22–5.81)
RDW: 13.7 % (ref 11.5–15.5)
WBC: 4.2 K/uL (ref 4.0–10.5)

## 2017-09-06 LAB — URINALYSIS, ROUTINE W REFLEX MICROSCOPIC
Bilirubin Urine: NEGATIVE
Glucose, UA: NEGATIVE mg/dL
Hgb urine dipstick: NEGATIVE
Ketones, ur: NEGATIVE mg/dL
Leukocytes, UA: NEGATIVE
Nitrite: NEGATIVE
Protein, ur: NEGATIVE mg/dL
Specific Gravity, Urine: 1.021 (ref 1.005–1.030)
pH: 5 (ref 5.0–8.0)

## 2017-09-06 LAB — HEMOGLOBIN A1C
Hgb A1c MFr Bld: 5.8 % — ABNORMAL HIGH (ref 4.8–5.6)
Mean Plasma Glucose: 119.76 mg/dL

## 2017-09-06 LAB — COMPREHENSIVE METABOLIC PANEL
ALBUMIN: 4.1 g/dL (ref 3.5–5.0)
ALK PHOS: 39 U/L (ref 38–126)
ALT: 20 U/L (ref 17–63)
AST: 23 U/L (ref 15–41)
Anion gap: 10 (ref 5–15)
BUN: 15 mg/dL (ref 6–20)
CALCIUM: 9.2 mg/dL (ref 8.9–10.3)
CO2: 22 mmol/L (ref 22–32)
Chloride: 106 mmol/L (ref 101–111)
Creatinine, Ser: 0.95 mg/dL (ref 0.61–1.24)
GFR calc Af Amer: >60 mL/min (ref >60)
GFR calc non Af Amer: >60 mL/min (ref >60)
GLUCOSE: 109 mg/dL — AB (ref 65–99)
Potassium: 4 mmol/L (ref 3.5–5.1)
Sodium: 138 mmol/L (ref 135–145)
Total Bilirubin: 0.6 mg/dL (ref 0.3–1.2)
Total Protein: 7.2 g/dL (ref 6.5–8.1)

## 2017-09-06 LAB — SURGICAL PCR SCREEN
MRSA, PCR: NEGATIVE
Staphylococcus aureus: NEGATIVE

## 2017-09-06 LAB — PROTIME-INR
INR: 0.95
Prothrombin Time: 12.6 seconds (ref 11.4–15.2)

## 2017-09-06 LAB — APTT: aPTT: 30 seconds (ref 24–36)

## 2017-09-06 LAB — TYPE AND SCREEN
ABO/RH(D): B POS
Antibody Screen: NEGATIVE

## 2017-09-06 LAB — ABO/RH: ABO/RH(D): B POS

## 2017-09-06 NOTE — Progress Notes (Signed)
Carotid duplex prelim: no significant ICA stenosis. UE doppler: bilateral palmar arch obliterates with radial compression, normal with ulnar compression. Farrel DemarkJill Eunice, RDMS, RVT

## 2017-09-07 MED ORDER — KENNESTONE BLOOD CARDIOPLEGIA (KBC) MANNITOL SYRINGE (20%, 32ML)
32.0000 mL | Freq: Once | INTRAVENOUS | Status: DC
  Filled 2017-09-07: qty 32

## 2017-09-07 MED ORDER — MAGNESIUM SULFATE 50 % IJ SOLN
40.0000 meq | INTRAMUSCULAR | Status: DC
  Filled 2017-09-07: qty 9.85

## 2017-09-07 MED ORDER — SODIUM CHLORIDE 0.9 % IV SOLN
INTRAVENOUS | Status: DC
  Filled 2017-09-07: qty 1

## 2017-09-07 MED ORDER — TRANEXAMIC ACID (OHS) PUMP PRIME SOLUTION
2.0000 mg/kg | INTRAVENOUS | Status: DC
  Filled 2017-09-07: qty 2.58

## 2017-09-07 MED ORDER — DEXMEDETOMIDINE HCL IN NACL 400 MCG/100ML IV SOLN
0.1000 ug/kg/h | INTRAVENOUS | Status: DC
  Filled 2017-09-07: qty 100

## 2017-09-07 MED ORDER — EPINEPHRINE PF 1 MG/ML IJ SOLN
0.0000 ug/min | INTRAVENOUS | Status: DC
  Filled 2017-09-07: qty 4

## 2017-09-07 MED ORDER — TRANEXAMIC ACID (OHS) BOLUS VIA INFUSION
15.0000 mg/kg | INTRAVENOUS | Status: AC
  Administered 2017-09-08: 1932 mg via INTRAVENOUS
  Filled 2017-09-07: qty 1932

## 2017-09-07 MED ORDER — KENNESTONE BLOOD CARDIOPLEGIA VIAL
13.0000 mL | Freq: Once | Status: DC
  Filled 2017-09-07: qty 13

## 2017-09-07 MED ORDER — POTASSIUM CHLORIDE 2 MEQ/ML IV SOLN
80.0000 meq | INTRAVENOUS | Status: DC
  Filled 2017-09-07: qty 40

## 2017-09-07 MED ORDER — PLASMA-LYTE 148 IV SOLN
INTRAVENOUS | Status: AC
  Administered 2017-09-08: 500 mL
  Filled 2017-09-07: qty 2.5

## 2017-09-07 MED ORDER — DOPAMINE-DEXTROSE 3.2-5 MG/ML-% IV SOLN
0.0000 ug/kg/min | INTRAVENOUS | Status: DC
  Filled 2017-09-07: qty 250

## 2017-09-07 MED ORDER — MILRINONE LACTATE IN DEXTROSE 20-5 MG/100ML-% IV SOLN
0.1250 ug/kg/min | INTRAVENOUS | Status: DC
  Filled 2017-09-07: qty 100

## 2017-09-07 MED ORDER — METOPROLOL TARTRATE 12.5 MG HALF TABLET
12.5000 mg | ORAL_TABLET | Freq: Once | ORAL | Status: AC
  Administered 2017-09-08: 12.5 mg via ORAL
  Filled 2017-09-07: qty 1

## 2017-09-07 MED ORDER — SODIUM CHLORIDE 0.9 % IV SOLN
1.5000 g | INTRAVENOUS | Status: AC
  Administered 2017-09-08: 1.5 g via INTRAVENOUS
  Filled 2017-09-07: qty 1.5

## 2017-09-07 MED ORDER — CHLORHEXIDINE GLUCONATE 0.12 % MT SOLN
15.0000 mL | Freq: Once | OROMUCOSAL | Status: AC
  Administered 2017-09-08: 15 mL via OROMUCOSAL
  Filled 2017-09-07: qty 15

## 2017-09-07 MED ORDER — SODIUM CHLORIDE 0.9 % IV SOLN
750.0000 mg | INTRAVENOUS | Status: DC
  Filled 2017-09-07: qty 750

## 2017-09-07 MED ORDER — VANCOMYCIN HCL 10 G IV SOLR
1500.0000 mg | INTRAVENOUS | Status: AC
  Administered 2017-09-08: 1500 mg via INTRAVENOUS
  Filled 2017-09-07: qty 1500

## 2017-09-07 MED ORDER — SODIUM CHLORIDE 0.9 % IV SOLN
30.0000 ug/min | INTRAVENOUS | Status: AC
  Administered 2017-09-08: 20 ug/min via INTRAVENOUS
  Filled 2017-09-07: qty 2

## 2017-09-07 MED ORDER — TRANEXAMIC ACID 1000 MG/10ML IV SOLN
1.5000 mg/kg/h | INTRAVENOUS | Status: AC
  Administered 2017-09-08: 1.5 mg/kg/h via INTRAVENOUS
  Administered 2017-09-08: 12:00:00 via INTRAVENOUS
  Filled 2017-09-07: qty 25

## 2017-09-07 MED ORDER — NITROGLYCERIN IN D5W 200-5 MCG/ML-% IV SOLN
2.0000 ug/min | INTRAVENOUS | Status: AC
  Administered 2017-09-08: 16.6 ug/min via INTRAVENOUS
  Filled 2017-09-07: qty 250

## 2017-09-07 MED ORDER — SODIUM CHLORIDE 0.9 % IV SOLN
INTRAVENOUS | Status: DC
  Filled 2017-09-07: qty 30

## 2017-09-08 ENCOUNTER — Encounter (HOSPITAL_COMMUNITY): Payer: Self-pay

## 2017-09-08 ENCOUNTER — Encounter (HOSPITAL_COMMUNITY)
Admission: RE | Disposition: A | Discharge status: Home / Self Care | Payer: Self-pay | Source: Ambulatory Visit | Attending: Thoracic Surgery (Cardiothoracic Vascular Surgery)

## 2017-09-08 ENCOUNTER — Inpatient Hospital Stay (HOSPITAL_COMMUNITY): Payer: BLUE CROSS/BLUE SHIELD | Admitting: Certified Registered"

## 2017-09-08 ENCOUNTER — Inpatient Hospital Stay (HOSPITAL_COMMUNITY): Payer: BLUE CROSS/BLUE SHIELD

## 2017-09-08 ENCOUNTER — Other Ambulatory Visit: Payer: Self-pay

## 2017-09-08 ENCOUNTER — Inpatient Hospital Stay (HOSPITAL_COMMUNITY)
Admission: RE | Admit: 2017-09-08 | Discharge: 2017-09-16 | DRG: 220 | Disposition: A | Discharge status: Home / Self Care | Payer: BLUE CROSS/BLUE SHIELD | Source: Ambulatory Visit | Attending: Thoracic Surgery (Cardiothoracic Vascular Surgery) | Admitting: Thoracic Surgery (Cardiothoracic Vascular Surgery)

## 2017-09-08 DIAGNOSIS — R000 Tachycardia, unspecified: Secondary | ICD-10-CM | POA: Diagnosis not present

## 2017-09-08 DIAGNOSIS — K449 Diaphragmatic hernia without obstruction or gangrene: Secondary | ICD-10-CM | POA: Diagnosis not present

## 2017-09-08 DIAGNOSIS — F419 Anxiety disorder, unspecified: Secondary | ICD-10-CM | POA: Diagnosis present

## 2017-09-08 DIAGNOSIS — I484 Atypical atrial flutter: Secondary | ICD-10-CM | POA: Diagnosis not present

## 2017-09-08 DIAGNOSIS — Z91013 Allergy to seafood: Secondary | ICD-10-CM

## 2017-09-08 DIAGNOSIS — Z8249 Family history of ischemic heart disease and other diseases of the circulatory system: Secondary | ICD-10-CM | POA: Diagnosis not present

## 2017-09-08 DIAGNOSIS — Q2549 Other congenital malformations of aorta: Secondary | ICD-10-CM | POA: Diagnosis not present

## 2017-09-08 DIAGNOSIS — I483 Typical atrial flutter: Secondary | ICD-10-CM | POA: Diagnosis not present

## 2017-09-08 DIAGNOSIS — Z7982 Long term (current) use of aspirin: Secondary | ICD-10-CM | POA: Diagnosis not present

## 2017-09-08 DIAGNOSIS — D62 Acute posthemorrhagic anemia: Secondary | ICD-10-CM | POA: Diagnosis not present

## 2017-09-08 DIAGNOSIS — I7121 Aneurysm of the ascending aorta, without rupture: Secondary | ICD-10-CM

## 2017-09-08 DIAGNOSIS — F17290 Nicotine dependence, other tobacco product, uncomplicated: Secondary | ICD-10-CM | POA: Diagnosis present

## 2017-09-08 DIAGNOSIS — I4891 Unspecified atrial fibrillation: Secondary | ICD-10-CM | POA: Diagnosis not present

## 2017-09-08 DIAGNOSIS — N400 Enlarged prostate without lower urinary tract symptoms: Secondary | ICD-10-CM | POA: Diagnosis present

## 2017-09-08 DIAGNOSIS — I712 Thoracic aortic aneurysm, without rupture: Secondary | ICD-10-CM

## 2017-09-08 DIAGNOSIS — E876 Hypokalemia: Secondary | ICD-10-CM | POA: Diagnosis not present

## 2017-09-08 DIAGNOSIS — I4892 Unspecified atrial flutter: Secondary | ICD-10-CM | POA: Diagnosis not present

## 2017-09-08 DIAGNOSIS — I2510 Atherosclerotic heart disease of native coronary artery without angina pectoris: Secondary | ICD-10-CM | POA: Diagnosis not present

## 2017-09-08 DIAGNOSIS — G4733 Obstructive sleep apnea (adult) (pediatric): Secondary | ICD-10-CM | POA: Diagnosis not present

## 2017-09-08 DIAGNOSIS — Z4682 Encounter for fitting and adjustment of non-vascular catheter: Secondary | ICD-10-CM

## 2017-09-08 DIAGNOSIS — Z23 Encounter for immunization: Secondary | ICD-10-CM | POA: Diagnosis not present

## 2017-09-08 DIAGNOSIS — M1990 Unspecified osteoarthritis, unspecified site: Secondary | ICD-10-CM | POA: Diagnosis not present

## 2017-09-08 DIAGNOSIS — Z9689 Presence of other specified functional implants: Secondary | ICD-10-CM

## 2017-09-08 DIAGNOSIS — Q2543 Congenital aneurysm of aorta: Secondary | ICD-10-CM

## 2017-09-08 DIAGNOSIS — Z6838 Body mass index (BMI) 38.0-38.9, adult: Secondary | ICD-10-CM | POA: Diagnosis not present

## 2017-09-08 DIAGNOSIS — I10 Essential (primary) hypertension: Secondary | ICD-10-CM | POA: Diagnosis not present

## 2017-09-08 HISTORY — PX: TEE WITHOUT CARDIOVERSION: SHX5443

## 2017-09-08 HISTORY — DX: Other congenital malformations of aorta: Q25.49

## 2017-09-08 HISTORY — PX: THORACIC AORTIC ANEURYSM REPAIR: SHX799

## 2017-09-08 HISTORY — DX: Congenital aneurysm of aorta: Q25.43

## 2017-09-08 LAB — GLUCOSE, CAPILLARY
GLUCOSE-CAPILLARY: 109 mg/dL — AB (ref 65–99)
GLUCOSE-CAPILLARY: 115 mg/dL — AB (ref 65–99)
GLUCOSE-CAPILLARY: 68 mg/dL (ref 65–99)
Glucose-Capillary: 108 mg/dL — ABNORMAL HIGH (ref 65–99)
Glucose-Capillary: 123 mg/dL — ABNORMAL HIGH (ref 65–99)
Glucose-Capillary: 128 mg/dL — ABNORMAL HIGH (ref 65–99)
Glucose-Capillary: 116 mg/dL — ABNORMAL HIGH (ref 65–99)
Glucose-Capillary: 146 mg/dL — ABNORMAL HIGH (ref 65–99)

## 2017-09-08 LAB — CBC
HCT: 37.6 % — ABNORMAL LOW (ref 39.0–52.0)
HEMATOCRIT: 38.6 % — AB (ref 39.0–52.0)
HEMOGLOBIN: 12.5 g/dL — AB (ref 13.0–17.0)
HEMOGLOBIN: 13 g/dL (ref 13.0–17.0)
MCH: 26.8 pg (ref 26.0–34.0)
MCH: 27.1 pg (ref 26.0–34.0)
MCHC: 33.2 g/dL (ref 30.0–36.0)
MCHC: 33.7 g/dL (ref 30.0–36.0)
MCV: 80.5 fL (ref 78.0–100.0)
MCV: 80.6 fL (ref 78.0–100.0)
PLATELETS: 125 10*3/uL — AB (ref 150–400)
Platelets: 146 10*3/uL — ABNORMAL LOW (ref 150–400)
RBC: 4.67 MIL/uL (ref 4.22–5.81)
RBC: 4.79 MIL/uL (ref 4.22–5.81)
RDW: 13.6 % (ref 11.5–15.5)
RDW: 13.8 % (ref 11.5–15.5)
WBC: 9.1 10*3/uL (ref 4.0–10.5)
WBC: 6.5 10*3/uL (ref 4.0–10.5)

## 2017-09-08 LAB — POCT I-STAT, CHEM 8
BUN: 15 mg/dL (ref 6–20)
BUN: 13 mg/dL (ref 6–20)
BUN: 14 mg/dL (ref 6–20)
BUN: 14 mg/dL (ref 6–20)
BUN: 15 mg/dL (ref 6–20)
BUN: 13 mg/dL (ref 6–20)
BUN: 14 mg/dL (ref 6–20)
BUN: 13 mg/dL (ref 6–20)
CALCIUM ION: 1.23 mmol/L (ref 1.15–1.40)
CALCIUM ION: 1.11 mmol/L — AB (ref 1.15–1.40)
CALCIUM ION: 1.15 mmol/L (ref 1.15–1.40)
CALCIUM ION: 1.15 mmol/L (ref 1.15–1.40)
CHLORIDE: 102 mmol/L (ref 101–111)
CHLORIDE: 102 mmol/L (ref 101–111)
CHLORIDE: 98 mmol/L — AB (ref 101–111)
CHLORIDE: 106 mmol/L (ref 101–111)
CREATININE: 0.8 mg/dL (ref 0.61–1.24)
CREATININE: 0.8 mg/dL (ref 0.61–1.24)
CREATININE: 0.8 mg/dL (ref 0.61–1.24)
Calcium, Ion: 1.21 mmol/L (ref 1.15–1.40)
Calcium, Ion: 1.14 mmol/L — ABNORMAL LOW (ref 1.15–1.40)
Calcium, Ion: 1.12 mmol/L — ABNORMAL LOW (ref 1.15–1.40)
Calcium, Ion: 1.12 mmol/L — ABNORMAL LOW (ref 1.15–1.40)
Chloride: 101 mmol/L (ref 101–111)
Chloride: 103 mmol/L (ref 101–111)
Chloride: 103 mmol/L (ref 101–111)
Chloride: 104 mmol/L (ref 101–111)
Creatinine, Ser: 0.8 mg/dL (ref 0.61–1.24)
Creatinine, Ser: 0.9 mg/dL (ref 0.61–1.24)
Creatinine, Ser: 0.7 mg/dL (ref 0.61–1.24)
Creatinine, Ser: 0.7 mg/dL (ref 0.61–1.24)
Creatinine, Ser: 0.7 mg/dL (ref 0.61–1.24)
GLUCOSE: 106 mg/dL — AB (ref 65–99)
GLUCOSE: 166 mg/dL — AB (ref 65–99)
Glucose, Bld: 108 mg/dL — ABNORMAL HIGH (ref 65–99)
Glucose, Bld: 108 mg/dL — ABNORMAL HIGH (ref 65–99)
Glucose, Bld: 154 mg/dL — ABNORMAL HIGH (ref 65–99)
Glucose, Bld: 126 mg/dL — ABNORMAL HIGH (ref 65–99)
Glucose, Bld: 159 mg/dL — ABNORMAL HIGH (ref 65–99)
Glucose, Bld: 119 mg/dL — ABNORMAL HIGH (ref 65–99)
HCT: 38 % — ABNORMAL LOW (ref 39.0–52.0)
HCT: 33 % — ABNORMAL LOW (ref 39.0–52.0)
HCT: 38 % — ABNORMAL LOW (ref 39.0–52.0)
HEMATOCRIT: 30 % — AB (ref 39.0–52.0)
HEMATOCRIT: 36 % — AB (ref 39.0–52.0)
HEMATOCRIT: 33 % — AB (ref 39.0–52.0)
HEMATOCRIT: 34 % — AB (ref 39.0–52.0)
HEMATOCRIT: 30 % — AB (ref 39.0–52.0)
HEMOGLOBIN: 10.2 g/dL — AB (ref 13.0–17.0)
HEMOGLOBIN: 11.2 g/dL — AB (ref 13.0–17.0)
HEMOGLOBIN: 11.6 g/dL — AB (ref 13.0–17.0)
HEMOGLOBIN: 11.2 g/dL — AB (ref 13.0–17.0)
Hemoglobin: 12.9 g/dL — ABNORMAL LOW (ref 13.0–17.0)
Hemoglobin: 12.2 g/dL — ABNORMAL LOW (ref 13.0–17.0)
Hemoglobin: 10.2 g/dL — ABNORMAL LOW (ref 13.0–17.0)
Hemoglobin: 12.9 g/dL — ABNORMAL LOW (ref 13.0–17.0)
POTASSIUM: 3.7 mmol/L (ref 3.5–5.1)
POTASSIUM: 3.5 mmol/L (ref 3.5–5.1)
POTASSIUM: 3.7 mmol/L (ref 3.5–5.1)
POTASSIUM: 4.2 mmol/L (ref 3.5–5.1)
POTASSIUM: 4.3 mmol/L (ref 3.5–5.1)
Potassium: 4.2 mmol/L (ref 3.5–5.1)
Potassium: 3.6 mmol/L (ref 3.5–5.1)
Potassium: 4.7 mmol/L (ref 3.5–5.1)
SODIUM: 141 mmol/L (ref 135–145)
SODIUM: 139 mmol/L (ref 135–145)
SODIUM: 140 mmol/L (ref 135–145)
SODIUM: 140 mmol/L (ref 135–145)
Sodium: 140 mmol/L (ref 135–145)
Sodium: 142 mmol/L (ref 135–145)
Sodium: 141 mmol/L (ref 135–145)
Sodium: 141 mmol/L (ref 135–145)
TCO2: 28 mmol/L (ref 22–32)
TCO2: 28 mmol/L (ref 22–32)
TCO2: 29 mmol/L (ref 22–32)
TCO2: 28 mmol/L (ref 22–32)
TCO2: 25 mmol/L (ref 22–32)
TCO2: 25 mmol/L (ref 22–32)
TCO2: 26 mmol/L (ref 22–32)
TCO2: 20 mmol/L — AB (ref 22–32)

## 2017-09-08 LAB — POCT I-STAT 3, ART BLOOD GAS (G3+)
ACID-BASE DEFICIT: 4 mmol/L — AB (ref 0.0–2.0)
ACID-BASE DEFICIT: 6 mmol/L — AB (ref 0.0–2.0)
Acid-Base Excess: 1 mmol/L (ref 0.0–2.0)
Acid-base deficit: 5 mmol/L — ABNORMAL HIGH (ref 0.0–2.0)
BICARBONATE: 21.4 mmol/L (ref 20.0–28.0)
Bicarbonate: 27.5 mmol/L (ref 20.0–28.0)
Bicarbonate: 21.4 mmol/L (ref 20.0–28.0)
Bicarbonate: 19.2 mmol/L — ABNORMAL LOW (ref 20.0–28.0)
O2 SAT: 100 %
O2 SAT: 99 %
O2 SAT: 99 %
O2 SAT: 99 %
PCO2 ART: 42.1 mmHg (ref 32.0–48.0)
PCO2 ART: 36.8 mmHg (ref 32.0–48.0)
PH ART: 7.336 — AB (ref 7.350–7.450)
PO2 ART: 128 mmHg — AB (ref 83.0–108.0)
PO2 ART: 163 mmHg — AB (ref 83.0–108.0)
PO2 ART: 161 mmHg — AB (ref 83.0–108.0)
Patient temperature: 35.6
Patient temperature: 37.2
Patient temperature: 37.8
TCO2: 29 mmol/L (ref 22–32)
TCO2: 23 mmol/L (ref 22–32)
TCO2: 23 mmol/L (ref 22–32)
TCO2: 20 mmol/L — ABNORMAL LOW (ref 22–32)
pCO2 arterial: 51.3 mmHg — ABNORMAL HIGH (ref 32.0–48.0)
pCO2 arterial: 40.6 mmHg (ref 32.0–48.0)
pH, Arterial: 7.308 — ABNORMAL LOW (ref 7.350–7.450)
pH, Arterial: 7.331 — ABNORMAL LOW (ref 7.350–7.450)
pH, Arterial: 7.329 — ABNORMAL LOW (ref 7.350–7.450)
pO2, Arterial: 392 mmHg — ABNORMAL HIGH (ref 83.0–108.0)

## 2017-09-08 LAB — POCT I-STAT 4, (NA,K, GLUC, HGB,HCT)
Glucose, Bld: 115 mg/dL — ABNORMAL HIGH (ref 65–99)
HCT: 37 % — ABNORMAL LOW (ref 39.0–52.0)
Hemoglobin: 12.6 g/dL — ABNORMAL LOW (ref 13.0–17.0)
Potassium: 3.9 mmol/L (ref 3.5–5.1)
Sodium: 143 mmol/L (ref 135–145)

## 2017-09-08 LAB — MAGNESIUM: MAGNESIUM: 2.8 mg/dL — AB (ref 1.7–2.4)

## 2017-09-08 LAB — APTT: APTT: 33 s (ref 24–36)

## 2017-09-08 LAB — PLATELET COUNT: Platelets: 162 10*3/uL (ref 150–400)

## 2017-09-08 LAB — PROTIME-INR
INR: 1.36
PROTHROMBIN TIME: 16.7 s — AB (ref 11.4–15.2)

## 2017-09-08 LAB — HEMOGLOBIN AND HEMATOCRIT, BLOOD
HCT: 33.2 % — ABNORMAL LOW (ref 39.0–52.0)
HEMOGLOBIN: 11.3 g/dL — AB (ref 13.0–17.0)

## 2017-09-08 LAB — CREATININE, SERUM
Creatinine, Ser: 0.88 mg/dL (ref 0.61–1.24)
GFR calc non Af Amer: >60 mL/min (ref >60)

## 2017-09-08 SURGERY — REPAIR, ANEURYSM, AORTA, THORACIC, ASCENDING
Anesthesia: General

## 2017-09-08 MED ORDER — LACTATED RINGERS IV SOLN
INTRAVENOUS | Status: DC | PRN
  Administered 2017-09-08: 08:00:00 via INTRAVENOUS

## 2017-09-08 MED ORDER — ROCURONIUM BROMIDE 10 MG/ML (PF) SYRINGE
PREFILLED_SYRINGE | INTRAVENOUS | Status: AC
  Filled 2017-09-08: qty 20

## 2017-09-08 MED ORDER — DEXTROSE 50 % IV SOLN
INTRAVENOUS | Status: AC
  Administered 2017-09-08: 50 mL
  Filled 2017-09-08: qty 50

## 2017-09-08 MED ORDER — NITROGLYCERIN IN D5W 200-5 MCG/ML-% IV SOLN: 0.0000 ug/min | INTRAVENOUS | Status: DC

## 2017-09-08 MED ORDER — HEPARIN SODIUM (PORCINE) 1000 UNIT/ML IJ SOLN
INTRAMUSCULAR | Status: DC | PRN
  Administered 2017-09-08: 39 mL via INTRAVENOUS

## 2017-09-08 MED ORDER — PROTAMINE SULFATE 10 MG/ML IV SOLN
INTRAVENOUS | Status: DC | PRN
  Administered 2017-09-08: 50 mg via INTRAVENOUS
  Administered 2017-09-08: 30 mg via INTRAVENOUS
  Administered 2017-09-08: 50 mg via INTRAVENOUS
  Administered 2017-09-08: 40 mg via INTRAVENOUS
  Administered 2017-09-08: 30 mg via INTRAVENOUS
  Administered 2017-09-08 (×3): 50 mg via INTRAVENOUS

## 2017-09-08 MED ORDER — VANCOMYCIN HCL IN DEXTROSE 1-5 GM/200ML-% IV SOLN
1000.0000 mg | Freq: Once | INTRAVENOUS | Status: AC
  Administered 2017-09-08: 1000 mg via INTRAVENOUS
  Filled 2017-09-08 (×2): qty 200

## 2017-09-08 MED ORDER — SODIUM CHLORIDE 0.9 % IV SOLN
INTRAVENOUS | Status: DC | PRN
  Administered 2017-09-08: 1 [IU]/h via INTRAVENOUS

## 2017-09-08 MED ORDER — METOPROLOL TARTRATE 25 MG/10 ML ORAL SUSPENSION: 12.5000 mg | Freq: Two times a day (BID) | ORAL | Status: DC

## 2017-09-08 MED ORDER — CHLORHEXIDINE GLUCONATE CLOTH 2 % EX PADS
6.0000 | MEDICATED_PAD | Freq: Every day | CUTANEOUS | Status: DC
  Administered 2017-09-08 – 2017-09-12 (×5): 6 via TOPICAL

## 2017-09-08 MED ORDER — SODIUM CHLORIDE 0.9 % IV SOLN
0.0000 ug/kg/h | INTRAVENOUS | Status: DC
  Filled 2017-09-08: qty 2

## 2017-09-08 MED ORDER — HEPARIN SODIUM (PORCINE) 1000 UNIT/ML IJ SOLN
INTRAMUSCULAR | Status: AC
  Filled 2017-09-08: qty 1

## 2017-09-08 MED ORDER — LACTATED RINGERS IV SOLN: INTRAVENOUS | Status: DC

## 2017-09-08 MED ORDER — SODIUM CHLORIDE 0.9 % IJ SOLN
OROMUCOSAL | Status: DC | PRN
  Administered 2017-09-08 (×3): 4 mL via TOPICAL

## 2017-09-08 MED ORDER — SODIUM CHLORIDE 0.45 % IV SOLN
INTRAVENOUS | Status: DC | PRN
  Administered 2017-09-08: 15:00:00 via INTRAVENOUS

## 2017-09-08 MED ORDER — SODIUM CHLORIDE 0.9 % IV SOLN
INTRAVENOUS | Status: DC | PRN
  Administered 2017-09-08: 0.2 ug/kg/h via INTRAVENOUS

## 2017-09-08 MED ORDER — HEMOSTATIC AGENTS (NO CHARGE) OPTIME
TOPICAL | Status: DC | PRN
  Administered 2017-09-08 (×2): 1 via TOPICAL

## 2017-09-08 MED ORDER — SODIUM CHLORIDE 0.9% FLUSH: 10.0000 mL | INTRAVENOUS | Status: DC | PRN

## 2017-09-08 MED ORDER — POTASSIUM CHLORIDE 10 MEQ/50ML IV SOLN
10.0000 meq | INTRAVENOUS | Status: AC
  Administered 2017-09-08 (×3): 10 meq via INTRAVENOUS

## 2017-09-08 MED ORDER — SODIUM CHLORIDE 0.9 % IJ SOLN
INTRAMUSCULAR | Status: AC
  Filled 2017-09-08: qty 20

## 2017-09-08 MED ORDER — SODIUM CHLORIDE 0.9% FLUSH: 3.0000 mL | INTRAVENOUS | Status: DC | PRN

## 2017-09-08 MED ORDER — MIDAZOLAM HCL 10 MG/2ML IJ SOLN
INTRAMUSCULAR | Status: AC
  Filled 2017-09-08: qty 2

## 2017-09-08 MED ORDER — SODIUM CHLORIDE 0.9 % IV SOLN
INTRAVENOUS | Status: DC | PRN
  Administered 2017-09-08: 750 mg via INTRAVENOUS

## 2017-09-08 MED ORDER — LIDOCAINE HCL (CARDIAC) 20 MG/ML IV SOLN
INTRAVENOUS | Status: DC | PRN
  Administered 2017-09-08: 80 mg via INTRAVENOUS

## 2017-09-08 MED ORDER — SODIUM CHLORIDE 0.9% FLUSH
10.0000 mL | Freq: Two times a day (BID) | INTRAVENOUS | Status: DC
  Administered 2017-09-08 – 2017-09-11 (×5): 10 mL
  Administered 2017-09-12: 20 mL

## 2017-09-08 MED ORDER — TRANEXAMIC ACID 1000 MG/10ML IV SOLN
1.5000 mg/kg/h | INTRAVENOUS | Status: DC
  Filled 2017-09-08: qty 25

## 2017-09-08 MED ORDER — LACTATED RINGERS IV SOLN
INTRAVENOUS | Status: DC
  Administered 2017-09-08 (×2): via INTRAVENOUS

## 2017-09-08 MED ORDER — CHLORHEXIDINE GLUCONATE 0.12% ORAL RINSE (MEDLINE KIT): 15.0000 mL | Freq: Two times a day (BID) | OROMUCOSAL | Status: DC

## 2017-09-08 MED ORDER — SODIUM CHLORIDE 0.9% FLUSH
3.0000 mL | Freq: Two times a day (BID) | INTRAVENOUS | Status: DC
  Administered 2017-09-09 – 2017-09-12 (×6): 3 mL via INTRAVENOUS

## 2017-09-08 MED ORDER — CHLORHEXIDINE GLUCONATE 0.12 % MT SOLN
15.0000 mL | OROMUCOSAL | Status: AC
  Administered 2017-09-08: 15 mL via OROMUCOSAL

## 2017-09-08 MED ORDER — FENTANYL CITRATE (PF) 250 MCG/5ML IJ SOLN
INTRAMUSCULAR | Status: AC
  Filled 2017-09-08: qty 25

## 2017-09-08 MED ORDER — 0.9 % SODIUM CHLORIDE (POUR BTL) OPTIME
TOPICAL | Status: DC | PRN
  Administered 2017-09-08: 5000 mL

## 2017-09-08 MED ORDER — BISACODYL 10 MG RE SUPP: 10.0000 mg | Freq: Every day | RECTAL | Status: DC

## 2017-09-08 MED ORDER — MAGNESIUM SULFATE 4 GM/100ML IV SOLN
4.0000 g | Freq: Once | INTRAVENOUS | Status: AC
  Administered 2017-09-08: 4 g via INTRAVENOUS
  Filled 2017-09-08: qty 100

## 2017-09-08 MED ORDER — ASPIRIN 81 MG PO CHEW: 324.0000 mg | CHEWABLE_TABLET | Freq: Every day | ORAL | Status: DC

## 2017-09-08 MED ORDER — ACETAMINOPHEN 160 MG/5ML PO SOLN: 1000.0000 mg | Freq: Four times a day (QID) | ORAL | Status: AC

## 2017-09-08 MED ORDER — SODIUM CHLORIDE 0.9 % IV SOLN
INTRAVENOUS | Status: DC
  Filled 2017-09-08: qty 1

## 2017-09-08 MED ORDER — PROPOFOL 10 MG/ML IV BOLUS
INTRAVENOUS | Status: DC | PRN
  Administered 2017-09-08: 60 mg via INTRAVENOUS
  Administered 2017-09-08: 30 mg via INTRAVENOUS

## 2017-09-08 MED ORDER — ONDANSETRON HCL 4 MG/2ML IJ SOLN: 4.0000 mg | Freq: Four times a day (QID) | INTRAMUSCULAR | Status: DC | PRN

## 2017-09-08 MED ORDER — LACTATED RINGERS IV SOLN
500.0000 mL | Freq: Once | INTRAVENOUS | Status: AC | PRN
  Administered 2017-09-08: 500 mL via INTRAVENOUS

## 2017-09-08 MED ORDER — ROCURONIUM BROMIDE 100 MG/10ML IV SOLN
INTRAVENOUS | Status: DC | PRN
  Administered 2017-09-08: 100 mg via INTRAVENOUS
  Administered 2017-09-08: 50 mg via INTRAVENOUS
  Administered 2017-09-08: 100 mg via INTRAVENOUS

## 2017-09-08 MED ORDER — PANTOPRAZOLE SODIUM 40 MG PO TBEC
40.0000 mg | DELAYED_RELEASE_TABLET | Freq: Every day | ORAL | Status: DC
  Administered 2017-09-10 – 2017-09-14 (×5): 40 mg via ORAL
  Filled 2017-09-08 (×5): qty 1

## 2017-09-08 MED ORDER — ORAL CARE MOUTH RINSE
15.0000 mL | Freq: Two times a day (BID) | OROMUCOSAL | Status: DC
  Administered 2017-09-08 – 2017-09-09 (×2): 15 mL via OROMUCOSAL

## 2017-09-08 MED ORDER — ORAL CARE MOUTH RINSE
15.0000 mL | Freq: Four times a day (QID) | OROMUCOSAL | Status: DC
  Administered 2017-09-08: 15 mL via OROMUCOSAL

## 2017-09-08 MED ORDER — ACETAMINOPHEN 500 MG PO TABS
1000.0000 mg | ORAL_TABLET | Freq: Four times a day (QID) | ORAL | Status: AC
  Administered 2017-09-08 – 2017-09-13 (×19): 1000 mg via ORAL
  Filled 2017-09-08 (×21): qty 2

## 2017-09-08 MED ORDER — FENTANYL CITRATE (PF) 250 MCG/5ML IJ SOLN
INTRAMUSCULAR | Status: DC | PRN
  Administered 2017-09-08: 100 ug via INTRAVENOUS
  Administered 2017-09-08: 50 ug via INTRAVENOUS
  Administered 2017-09-08 (×2): 100 ug via INTRAVENOUS
  Administered 2017-09-08: 50 ug via INTRAVENOUS
  Administered 2017-09-08: 400 ug via INTRAVENOUS
  Administered 2017-09-08: 100 ug via INTRAVENOUS
  Administered 2017-09-08: 250 ug via INTRAVENOUS
  Administered 2017-09-08: 100 ug via INTRAVENOUS

## 2017-09-08 MED ORDER — ACETAMINOPHEN 650 MG RE SUPP
650.0000 mg | Freq: Once | RECTAL | Status: AC
  Administered 2017-09-08: 650 mg via RECTAL

## 2017-09-08 MED ORDER — CHLORHEXIDINE GLUCONATE 4 % EX LIQD: 30.0000 mL | CUTANEOUS | Status: DC

## 2017-09-08 MED ORDER — TAMSULOSIN HCL 0.4 MG PO CAPS
0.4000 mg | ORAL_CAPSULE | Freq: Every day | ORAL | Status: DC
  Administered 2017-09-09 – 2017-09-14 (×6): 0.4 mg via ORAL
  Filled 2017-09-08 (×6): qty 1

## 2017-09-08 MED ORDER — DOCUSATE SODIUM 100 MG PO CAPS
200.0000 mg | ORAL_CAPSULE | Freq: Every day | ORAL | Status: DC
  Administered 2017-09-09 – 2017-09-14 (×6): 200 mg via ORAL
  Filled 2017-09-08 (×7): qty 2

## 2017-09-08 MED ORDER — METOPROLOL TARTRATE 12.5 MG HALF TABLET
12.5000 mg | ORAL_TABLET | Freq: Two times a day (BID) | ORAL | Status: DC
  Administered 2017-09-10 (×3): 12.5 mg via ORAL
  Filled 2017-09-08 (×3): qty 1

## 2017-09-08 MED ORDER — ALBUMIN HUMAN 5 % IV SOLN
INTRAVENOUS | Status: DC | PRN
  Administered 2017-09-08 (×3): via INTRAVENOUS

## 2017-09-08 MED ORDER — MIDAZOLAM HCL 2 MG/2ML IJ SOLN: 2.0000 mg | INTRAMUSCULAR | Status: DC | PRN

## 2017-09-08 MED ORDER — CEFAZOLIN SODIUM-DEXTROSE 2-4 GM/100ML-% IV SOLN
2.0000 g | Freq: Three times a day (TID) | INTRAVENOUS | Status: AC
  Administered 2017-09-08 – 2017-09-10 (×6): 2 g via INTRAVENOUS
  Filled 2017-09-08 (×6): qty 100

## 2017-09-08 MED ORDER — BISACODYL 5 MG PO TBEC
10.0000 mg | DELAYED_RELEASE_TABLET | Freq: Every day | ORAL | Status: DC
  Administered 2017-09-09 – 2017-09-14 (×6): 10 mg via ORAL
  Filled 2017-09-08 (×6): qty 2

## 2017-09-08 MED ORDER — FAMOTIDINE IN NACL 20-0.9 MG/50ML-% IV SOLN
20.0000 mg | Freq: Two times a day (BID) | INTRAVENOUS | Status: AC
  Administered 2017-09-08: 20 mg via INTRAVENOUS

## 2017-09-08 MED ORDER — INSULIN REGULAR BOLUS VIA INFUSION
0.0000 [IU] | Freq: Three times a day (TID) | INTRAVENOUS | Status: DC
  Filled 2017-09-08: qty 10

## 2017-09-08 MED ORDER — PROPOFOL 10 MG/ML IV BOLUS
INTRAVENOUS | Status: AC
  Filled 2017-09-08: qty 20

## 2017-09-08 MED ORDER — SODIUM CHLORIDE 0.9 % IV SOLN
INTRAVENOUS | Status: DC
  Administered 2017-09-08: 20:00:00 via INTRAVENOUS

## 2017-09-08 MED ORDER — MIDAZOLAM HCL 5 MG/5ML IJ SOLN
INTRAMUSCULAR | Status: DC | PRN
  Administered 2017-09-08: 1 mg via INTRAVENOUS
  Administered 2017-09-08: 3 mg via INTRAVENOUS
  Administered 2017-09-08: 2 mg via INTRAVENOUS
  Administered 2017-09-08 (×2): 1 mg via INTRAVENOUS
  Administered 2017-09-08: 2 mg via INTRAVENOUS

## 2017-09-08 MED ORDER — SODIUM CHLORIDE 0.9 % IV SOLN: 250.0000 mL | INTRAVENOUS | Status: DC

## 2017-09-08 MED ORDER — METOPROLOL TARTRATE 5 MG/5ML IV SOLN: 2.5000 mg | INTRAVENOUS | Status: DC | PRN

## 2017-09-08 MED ORDER — ALBUMIN HUMAN 5 % IV SOLN
250.0000 mL | INTRAVENOUS | Status: AC | PRN
  Administered 2017-09-08 (×4): 250 mL via INTRAVENOUS
  Filled 2017-09-08 (×2): qty 250

## 2017-09-08 MED ORDER — OXYCODONE HCL 5 MG PO TABS
5.0000 mg | ORAL_TABLET | ORAL | Status: DC | PRN
  Administered 2017-09-09 – 2017-09-11 (×8): 10 mg via ORAL
  Administered 2017-09-11 – 2017-09-12 (×2): 5 mg via ORAL
  Administered 2017-09-12 – 2017-09-13 (×3): 10 mg via ORAL
  Filled 2017-09-08: qty 1
  Filled 2017-09-08 (×2): qty 2
  Filled 2017-09-08: qty 1
  Filled 2017-09-08 (×10): qty 2

## 2017-09-08 MED ORDER — TRAMADOL HCL 50 MG PO TABS
50.0000 mg | ORAL_TABLET | ORAL | Status: DC | PRN
  Administered 2017-09-09: 50 mg via ORAL
  Administered 2017-09-13: 100 mg via ORAL
  Administered 2017-09-13: 50 mg via ORAL
  Administered 2017-09-14 (×3): 100 mg via ORAL
  Filled 2017-09-08: qty 2
  Filled 2017-09-08: qty 1
  Filled 2017-09-08 (×4): qty 2

## 2017-09-08 MED ORDER — EPHEDRINE SULFATE 50 MG/ML IJ SOLN
INTRAMUSCULAR | Status: DC | PRN
  Administered 2017-09-08: 10 mg via INTRAVENOUS
  Administered 2017-09-08 (×2): 5 mg via INTRAVENOUS

## 2017-09-08 MED ORDER — MORPHINE SULFATE (PF) 2 MG/ML IV SOLN
2.0000 mg | INTRAVENOUS | Status: DC | PRN
  Administered 2017-09-09 (×2): 2 mg via INTRAVENOUS
  Filled 2017-09-08: qty 1

## 2017-09-08 MED ORDER — MORPHINE SULFATE (PF) 2 MG/ML IV SOLN
1.0000 mg | INTRAVENOUS | Status: AC | PRN
  Administered 2017-09-08: 2 mg via INTRAVENOUS
  Filled 2017-09-08 (×2): qty 1

## 2017-09-08 MED ORDER — DEXTROSE 50 % IV SOLN
13.0000 mL | Freq: Once | INTRAVENOUS | Status: AC
  Administered 2017-09-08: 13 mL via INTRAVENOUS

## 2017-09-08 MED ORDER — SODIUM CHLORIDE 0.9 % IV SOLN
0.0000 ug/min | INTRAVENOUS | Status: DC
  Administered 2017-09-09: 20 ug/min via INTRAVENOUS
  Filled 2017-09-08: qty 2

## 2017-09-08 MED ORDER — HEMOSTATIC AGENTS (NO CHARGE) OPTIME
TOPICAL | Status: DC | PRN
  Administered 2017-09-08: 1 via TOPICAL

## 2017-09-08 MED ORDER — ACETAMINOPHEN 160 MG/5ML PO SOLN: 650.0000 mg | Freq: Once | ORAL | Status: AC

## 2017-09-08 MED ORDER — ASPIRIN EC 325 MG PO TBEC
325.0000 mg | DELAYED_RELEASE_TABLET | Freq: Every day | ORAL | Status: DC
  Administered 2017-09-09 – 2017-09-11 (×3): 325 mg via ORAL
  Filled 2017-09-08 (×3): qty 1

## 2017-09-08 SURGICAL SUPPLY — 106 items
ADAPTER CARDIO PERF ANTE/RETRO (ADAPTER) ×3 IMPLANT
ADPR PRFSN 84XANTGRD RTRGD (ADAPTER) ×1
BAG DECANTER FOR FLEXI CONT (MISCELLANEOUS) ×3 IMPLANT
BENZOIN TINCTURE PRP APPL 2/3 (GAUZE/BANDAGES/DRESSINGS) IMPLANT
BLADE CLIPPER SURG (BLADE) IMPLANT
BLADE STERNUM SYSTEM 6 (BLADE) ×3 IMPLANT
BLADE SURG 15 STRL LF DISP TIS (BLADE) ×1 IMPLANT
BLADE SURG 15 STRL SS (BLADE) ×2
CANISTER SUCT 3000ML PPV (MISCELLANEOUS) ×3 IMPLANT
CANN PRFSN .5XCNCT 15X34-48 (MISCELLANEOUS)
CANNULA ARTERIAL 007325 (MISCELLANEOUS) IMPLANT
CANNULA ARTERIAL 14F 007324 (MISCELLANEOUS) IMPLANT
CANNULA EZ GLIDE 8.0 24FR (CANNULA) ×3 IMPLANT
CANNULA PRFSN .5XCNCT 15X34-48 (MISCELLANEOUS) IMPLANT
CANNULA VEN 2 STAGE (MISCELLANEOUS)
CATH THORACIC 28FR (CATHETERS) IMPLANT
CATH THORACIC 36FR RT ANG (CATHETERS) IMPLANT
CAUTERY EYE LOW TEMP 1300F FIN (OPHTHALMIC RELATED) IMPLANT
CAUTERY SURG HI TEMP FINE TIP (MISCELLANEOUS) ×3 IMPLANT
CLIP VESOCCLUDE MED 6/CT (CLIP) IMPLANT
CLIP VESOCCLUDE SM WIDE 24/CT (CLIP) IMPLANT
CONN 3/8X3/8 GISH STERILE (MISCELLANEOUS) IMPLANT
CONN Y 3/8X3/8X3/8  BEN (MISCELLANEOUS)
CONN Y 3/8X3/8X3/8 BEN (MISCELLANEOUS) IMPLANT
CONT SPEC 4OZ CLIKSEAL STRL BL (MISCELLANEOUS) ×3 IMPLANT
CRADLE DONUT ADULT HEAD (MISCELLANEOUS) IMPLANT
DRAIN CHANNEL 32F RND 10.7 FF (WOUND CARE) ×3 IMPLANT
DRAPE CARDIOVASCULAR INCISE (DRAPES) ×2
DRAPE SRG 135X102X78XABS (DRAPES) ×1 IMPLANT
DRSG COVADERM 4X14 (GAUZE/BANDAGES/DRESSINGS) ×3 IMPLANT
ELECT REM PT RETURN 9FT ADLT (ELECTROSURGICAL) ×6
ELECTRODE REM PT RTRN 9FT ADLT (ELECTROSURGICAL) ×2 IMPLANT
FELT TEFLON 6X6 (MISCELLANEOUS) ×3 IMPLANT
GAUZE SPONGE 4X4 12PLY STRL (GAUZE/BANDAGES/DRESSINGS) ×3 IMPLANT
GLOVE BIO SURGEON STRL SZ8.5 (GLOVE) ×3 IMPLANT
GLOVE BIOGEL PI IND STRL 6 (GLOVE) ×2 IMPLANT
GLOVE BIOGEL PI IND STRL 6.5 (GLOVE) ×4 IMPLANT
GLOVE BIOGEL PI IND STRL 8 (GLOVE) ×2 IMPLANT
GLOVE BIOGEL PI INDICATOR 6 (GLOVE) ×4
GLOVE BIOGEL PI INDICATOR 6.5 (GLOVE) ×8
GLOVE BIOGEL PI INDICATOR 8 (GLOVE) ×4
GLOVE ECLIPSE 8.0 STRL XLNG CF (GLOVE) ×12 IMPLANT
GOWN STRL REUS W/ TWL LRG LVL3 (GOWN DISPOSABLE) ×4 IMPLANT
GOWN STRL REUS W/ TWL XL LVL3 (GOWN DISPOSABLE) ×1 IMPLANT
GOWN STRL REUS W/TWL 2XL LVL3 (GOWN DISPOSABLE) ×9 IMPLANT
GOWN STRL REUS W/TWL LRG LVL3 (GOWN DISPOSABLE) ×8
GOWN STRL REUS W/TWL XL LVL3 (GOWN DISPOSABLE) ×3
GRAFT GELWEAVE VALSALVA 30CM (Prosthesis & Implant Heart) ×3 IMPLANT
HEMOSTAT POWDER KIT SURGIFOAM (HEMOSTASIS) ×9 IMPLANT
HEMOSTAT SURGICEL 2X14 (HEMOSTASIS) ×6 IMPLANT
INSERT FOGARTY SM (MISCELLANEOUS) IMPLANT
INSERT FOGARTY XLG (MISCELLANEOUS) ×3 IMPLANT
KIT BASIN OR (CUSTOM PROCEDURE TRAY) ×3 IMPLANT
KIT ROOM TURNOVER OR (KITS) ×3 IMPLANT
KIT SUCTION CATH 14FR (SUCTIONS) ×6 IMPLANT
LINE VENT (MISCELLANEOUS) ×3 IMPLANT
NEEDLE AORTIC AIR ASPIRATING (NEEDLE) IMPLANT
NS IRRIG 1000ML POUR BTL (IV SOLUTION) ×15 IMPLANT
PACK E OPEN HEART (SUTURE) ×3 IMPLANT
PACK OPEN HEART (CUSTOM PROCEDURE TRAY) ×3 IMPLANT
PAD ARMBOARD 7.5X6 YLW CONV (MISCELLANEOUS) ×12 IMPLANT
SEALANT SURG COSEAL 8ML (VASCULAR PRODUCTS) IMPLANT
SET CARDIOPLEGIA MPS 5001102 (MISCELLANEOUS) ×3 IMPLANT
SPONGE LAP 18X18 X RAY DECT (DISPOSABLE) IMPLANT
SPONGE LAP 4X18 X RAY DECT (DISPOSABLE) IMPLANT
STAPLER VISISTAT 35W (STAPLE) IMPLANT
STOPCOCK 4 WAY LG BORE MALE ST (IV SETS) IMPLANT
SUT ETHIBON 2 0 V 52N 30 (SUTURE) ×6 IMPLANT
SUT ETHIBOND 2 0 SH (SUTURE) ×2
SUT ETHIBOND 2 0 SH 36X2 (SUTURE) ×1 IMPLANT
SUT ETHIBOND X763 2 0 SH 1 (SUTURE) ×3 IMPLANT
SUT MNCRL AB 3-0 PS2 18 (SUTURE) IMPLANT
SUT PROLENE 3 0 RB 1 (SUTURE) IMPLANT
SUT PROLENE 3 0 SH DA (SUTURE) ×3 IMPLANT
SUT PROLENE 4 0 RB 1 (SUTURE) ×32
SUT PROLENE 4 0 SH DA (SUTURE) IMPLANT
SUT PROLENE 4-0 RB1 .5 CRCL 36 (SUTURE) ×16 IMPLANT
SUT PROLENE 5 0 C 1 36 (SUTURE) ×15 IMPLANT
SUT PROLENE 6 0 C 1 30 (SUTURE) ×12 IMPLANT
SUT PROLENE 6 0 CC (SUTURE) IMPLANT
SUT PROLENE 7 0 BV 1 (SUTURE) IMPLANT
SUT PROLENE 7 0 BV1 MDA (SUTURE) IMPLANT
SUT STEEL STERNAL CCS#1 18IN (SUTURE) IMPLANT
SUT STEEL SZ 6 DBL 3X14 BALL (SUTURE) IMPLANT
SUT VIC AB 1 CT1 18XCR BRD 8 (SUTURE) IMPLANT
SUT VIC AB 1 CT1 8-18 (SUTURE)
SUT VIC AB 1 CTX 27 (SUTURE) ×6 IMPLANT
SUT VIC AB 1 CTX 36 (SUTURE) ×3
SUT VIC AB 1 CTX36XBRD ANBCTR (SUTURE) ×1 IMPLANT
SUT VIC AB 2-0 CT1 27 (SUTURE)
SUT VIC AB 2-0 CT1 TAPERPNT 27 (SUTURE) IMPLANT
SUT VIC AB 2-0 CTX 36 (SUTURE) IMPLANT
SUT VIC AB 3-0 SH 27 (SUTURE)
SUT VIC AB 3-0 SH 27X BRD (SUTURE) IMPLANT
SUT VIC AB 3-0 X1 27 (SUTURE) IMPLANT
SUT VICRYL 4-0 PS2 18IN ABS (SUTURE) IMPLANT
SYR 10ML KIT SKIN ADHESIVE (MISCELLANEOUS) ×3 IMPLANT
SYSTEM SAHARA CHEST DRAIN ATS (WOUND CARE) ×3 IMPLANT
TOWEL GREEN STERILE (TOWEL DISPOSABLE) ×3 IMPLANT
TOWEL GREEN STERILE FF (TOWEL DISPOSABLE) ×3 IMPLANT
TRAY CATH LUMEN 1 20CM STRL (SET/KITS/TRAYS/PACK) IMPLANT
TRAY FOLEY SILVER 14FR TEMP (SET/KITS/TRAYS/PACK) ×3 IMPLANT
TUBE CONNECTING 12'X1/4 (SUCTIONS) ×1
TUBE CONNECTING 12X1/4 (SUCTIONS) ×2 IMPLANT
WATER STERILE IRR 1000ML POUR (IV SOLUTION) ×6 IMPLANT
YANKAUER SUCT BULB TIP NO VENT (SUCTIONS) ×3 IMPLANT

## 2017-09-08 NOTE — OR Nursing (Signed)
Twenty minute call to SICU charge nurse at 1357. Spoke to [place]. Cath Lab also notified of timing.

## 2017-09-08 NOTE — Anesthesia Preprocedure Evaluation (Signed)
Anesthesia Evaluation  Patient identified by MRN, date of birth, ID band Patient awake    Reviewed: Allergy & Precautions, NPO status , Patient's Chart, lab work & pertinent test results  History of Anesthesia Complications Negative for: history of anesthetic complications  Airway Mallampati: II  TM Distance: >3 FB Neck ROM: Full    Dental  (+) Teeth Intact   Pulmonary sleep apnea , former smoker,    breath sounds clear to auscultation       Cardiovascular hypertension, Pt. on medications (-) angina+ Peripheral Vascular Disease  (-) Past MI  Rhythm:Regular     Neuro/Psych  Neuromuscular disease    GI/Hepatic hiatal hernia,   Endo/Other    Renal/GU      Musculoskeletal  (+) Arthritis ,   Abdominal   Peds  Hematology   Anesthesia Other Findings Dilated sinus of valsalva  Reproductive/Obstetrics                            Anesthesia Physical Anesthesia Plan  ASA: IV  Anesthesia Plan: General   Post-op Pain Management:    Induction: Intravenous  PONV Risk Score and Plan: 2 and Treatment may vary due to age or medical condition  Airway Management Planned: Oral ETT  Additional Equipment: Arterial line, CVP, PA Cath, TEE and Ultrasound Guidance Line Placement  Intra-op Plan:   Post-operative Plan: Post-operative intubation/ventilation  Informed Consent: I have reviewed the patients History and Physical, chart, labs and discussed the procedure including the risks, benefits and alternatives for the proposed anesthesia with the patient or authorized representative who has indicated his/her understanding and acceptance.   Dental advisory given  Plan Discussed with: CRNA and Surgeon  Anesthesia Plan Comments:         Anesthesia Quick Evaluation

## 2017-09-08 NOTE — Transfer of Care (Signed)
Immediate Anesthesia Transfer of Care Note  Patient: Matthew Hoffman  Procedure(s) Performed: REPAIR OF SINUS OF VALSALVA ANEURYSM (N/A ) TRANSESOPHAGEAL ECHOCARDIOGRAM (TEE) (N/A )  Patient Location: ICU  Anesthesia Type:General  Level of Consciousness: Patient remains intubated per anesthesia plan  Airway & Oxygen Therapy: Patient remains intubated per anesthesia plan and Patient placed on Ventilator (see vital sign flow sheet for setting)  Post-op Assessment: Report given to RN and Post -op Vital signs reviewed and stable  Post vital signs: Reviewed and stable  Last Vitals:  Vitals:   09/08/17 0637 09/08/17 1435  BP: (!) 152/93 (!) 102/53  Pulse: 69 70  Resp:  12  Temp:    SpO2:  100%    Last Pain:  Vitals:   09/08/17 0635  TempSrc:   PainSc: 0-No pain      Patients Stated Pain Goal: 3 (09/08/17 [id number])  Complications: No apparent anesthesia complications

## 2017-09-08 NOTE — Interval H&P Note (Signed)
History and Physical Interval Note:  09/08/2017 7:47 AM  Wells GuilesFoster L Knappenberger  has presented today for surgery, with the diagnosis of SINUS OF VALSALVE ANEURYSM  The various methods of treatment have been discussed with the patient and family. After consideration of risks, benefits and other options for treatment, the patient has consented to  Procedure(s): REPAIR OF SINUS OF VALSALVA ANEURYSM (N/A) TRANSESOPHAGEAL ECHOCARDIOGRAM (TEE) (N/A) as a surgical intervention .  The patient's history has been reviewed, patient examined, no change in status, stable for surgery.  I have reviewed the patient's chart and labs.  Questions were answered to the patient's satisfaction.     Loreli SlotSteven C Brettney Ficken

## 2017-09-08 NOTE — Brief Op Note (Signed)
09/08/2017  3:06 PM  PATIENT:  Matthew Hoffman  52 y.o. male  PRE-OPERATIVE DIAGNOSIS:  SINUS OF VALSALVA ANEURYSM  POST-OPERATIVE DIAGNOSIS:  SINUS OF VALSALVA ANEURYSM  PROCEDURE:  Procedure(s) with comments: REPAIR OF SINUS OF VALSALVA ANEURYSM (N/A) - Using 30mm Valsalva Gelweave Graft TRANSESOPHAGEAL ECHOCARDIOGRAM (TEE) (N/A) Resuspension of Aortic Valve  SURGEON:  Surgeon(s) and Role:    * Loreli SlotHendrickson, Steven C, MD - Primary  PHYSICIAN ASSISTANT:  Jari Favreessa Conte, PA-C   ANESTHESIA:   general  EBL:  975 mL   BLOOD ADMINISTERED:none  DRAINS: ROUTINE   LOCAL MEDICATIONS USED:  NONE  SPECIMEN:  Aneurysm of sinus of Valsalva  DISPOSITION OF SPECIMEN:  PATHOLOGY  COUNTS:  YES  DICTATION: .Dragon Dictation  PLAN OF CARE: Admit to inpatient   PATIENT DISPOSITION:  ICU - intubated and hemodynamically stable.   Delay start of Pharmacological VTE agent (>24hrs) due to surgical blood loss or risk of bleeding: yes

## 2017-09-08 NOTE — OR Nursing (Signed)
Forty-five minute call to SICU charge nurse at [phone]. Spoke to [place].

## 2017-09-08 NOTE — Anesthesia Procedure Notes (Signed)
Central Venous Catheter Insertion Performed by: Oleta Mouse, MD, anesthesiologist Start/End3/14/2019 7:19 AM, 09/08/2017 7:25 AM Patient location: Pre-op. Preanesthetic checklist: patient identified, IV checked, site marked, risks and benefits discussed, surgical consent, monitors and equipment checked, pre-op evaluation, timeout performed and anesthesia consent Lidocaine 1% used for infiltration and patient sedated Hand hygiene performed  and maximum sterile barriers used  Catheter size: 9 Fr Total catheter length 10. MAC introducer Procedure performed using ultrasound guided technique. Ultrasound Notes:anatomy identified, needle tip was noted to be adjacent to the nerve/plexus identified, no ultrasound evidence of intravascular and/or intraneural injection and image(s) printed for medical record Attempts: 1 Following insertion, line sutured and dressing applied. Post procedure assessment: blood return through all ports, free fluid flow and no air  Patient tolerated the procedure well with no immediate complications.

## 2017-09-08 NOTE — Anesthesia Procedure Notes (Signed)
Central Venous Catheter Insertion Performed by: Val EagleMoser, Hoke Baer, MD, anesthesiologist Start/End3/14/2019 7:19 AM, 09/08/2017 7:25 AM Patient location: Pre-op. Preanesthetic checklist: patient identified, IV checked, site marked, risks and benefits discussed, surgical consent, monitors and equipment checked, pre-op evaluation, timeout performed and anesthesia consent Hand hygiene performed  and maximum sterile barriers used  PA cath was placed.Swan type:thermodilution Procedure performed without using ultrasound guided technique. Attempts: 1 Patient tolerated the procedure well with no immediate complications.

## 2017-09-08 NOTE — Procedures (Signed)
Extubation Procedure Note  Patient Details:   Name: Wells GuilesFoster L Tippetts DOB: 04/24/1966 MRN: [id number]   Airway Documentation:  Airway 8 mm (Active)  Secured at (cm) 24 cm 09/08/2017  6:52 PM  Measured From Lips 09/08/2017  6:52 PM  Secured Location Right 09/08/2017  6:52 PM  Secured By Other (Comment) 09/08/2017  4:20 PM  Site Condition Dry 09/08/2017  6:27 PM    Evaluation  O2 sats: stable throughout Complications: No apparent complications Patient did tolerate procedure well. Bilateral Breath Sounds: Clear, Diminished   Yes   Patient performed a NIF > -40 with good effort; VC 1.0 L .  Patient had a positive cuff leak.  Patient was extubated to 4 L [place].  Patient performed IS 750 mL.    Epifanio LeschesSarpy, Thelmer Legler A 09/08/2017, 7:43 PM

## 2017-09-08 NOTE — Anesthesia Procedure Notes (Signed)
Arterial Line Insertion Start/End3/14/2019 7:05 AM, 09/08/2017 7:15 AM Performed by: Mackynzie Woolford T, Scientist, clinical (histocompatibility and immunogenetics)CRNA, CRNA  Patient location: Pre-op. Preanesthetic checklist: patient identified, IV checked, site marked, risks and benefits discussed, surgical consent and pre-op evaluation Lidocaine 1% used for infiltration and patient sedated Left, radial was placed Catheter size: 20 G Hand hygiene performed  and maximum sterile barriers used   Attempts: 1 Procedure performed without using ultrasound guided technique. Following insertion, dressing applied and Biopatch. Post procedure assessment: normal  Patient tolerated the procedure well with no immediate complications.

## 2017-09-08 NOTE — Op Note (Signed)
NAME:  Matthew Hoffman, Matthew Hoffman              ACCOUNT NO.:  [id number]  MEDICAL RECORD NO.:  [id number]  LOCATION:  [facility]                         FACILITY:  [facility]  PHYSICIAN:  Salvatore Decent. Dorris Fetch, M.D.DATE OF BIRTH:  [birth year]  DATE OF PROCEDURE:  09/08/2017 DATE OF DISCHARGE:                              OPERATIVE REPORT   PREOPERATIVE DIAGNOSIS:  Sinus of Valsalva aneurysm.  POSTOPERATIVE DIAGNOSIS:  Sinus of Valsalva aneurysm.  PROCEDURE:  Repair of sinus of Valsalva aneurysm with reimplantation of aortic valve using a 30-mm Valsalva Gelweave graft (serial #[id number]).  SURGEON:  Salvatore Decent. Dorris Fetch, M.D.  ASSISTANT:  Jari Favre, PA-C.  ANESTHESIA:  General.  FINDINGS:  Large sinus of Valsalva aneurysm.  Normal size aorta at sinotubular junction and distally.  Preserved left ventricular function with no significant aortic insufficiency pre or post bypass.  CLINICAL NOTE:  Matthew Hoffman is a 52 year old gentleman who recently was evaluated for hematuria.  As part of his evaluation, he had a CT of the abdomen.  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  A CT angio of the chest showed a 7.3-cm sinus of Valsalva aneurysm.  He was advised to undergo surgical repair.  He had a cardiac catheterization, which revealed no significant coronary artery disease.  Echocardiogram showed no significant aortic insufficiency.  The aortic valve was a tricuspid valve with no evidence of stenosis.  The indications, risks, benefits, and alternatives were discussed in detail with the patient.  He understood and accepted the risks and agreed to proceed.  OPERATIVE NOTE:  Matthew Hoffman was brought to the preoperative holding area on September 08, 2017.  Anesthesia placed a Swan-Ganz catheter and arterial blood pressure monitoring line.  He was taken to the operating room, anesthetized, and intubated.  Intravenous antibiotics were administered.  Transesophageal echocardiography was performed by  Dr. Val Eagle.  Please see his separately dictated note for full details.  There was a tricuspid aortic valve with no significant aortic insufficiency.  There was a large sinus of Valsalva aneurysm.  There was preserved left ventricular function.  A Foley catheter was placed.  The chest, abdomen, and legs were prepped and draped in usual sterile fashion.  After performing a time-out, a median sternotomy was performed. Hemostasis was achieved.  A retractor was placed.  The pericardium was opened.  The patient was fully heparinized.  After confirming adequate anticoagulation with ACT measurement, the aorta was cannulated via concentric 2-0 Ethibond pledgeted pursestring sutures.  It should be noted that the aorta from the sinotubular junction distally was of normal size and relatively small given the patient's body habitus. There was significant cardiomegaly.  A dual-stage venous cannula was placed via a pursestring suture in right atrial appendage. Cardiopulmonary bypass was initiated.  Flows were maintained per protocol.  The patient was cooled to 32 degrees Celsius.  The left ventricular vent was placed via a pursestring suture in the right superior pulmonary vein.  A retrograde cardioplegia cannula was placed via a pursestring suture in the right atrium and directed into the coronary sinus.  An antegrade cardioplegic cannula was placed in the ascending aorta.  The aorta was crossclamped.  The left ventricle was  emptied with the vents.  Cardiac arrest then was achieved with a combination of cold antegrade and retrograde KCB cardioplegia and topical iced saline.  An initial liter of cardioplegia was given antegrade.  There was rapid diastolic arrest and septal cooling.  An additional 500 mL of cardioplegia was given retrograde.  Additional cardioplegia was given at 60 minutes and then at 30-minute intervals thereafter.  The aorta was transected just above the sino-tubular  junction.  All 3 sinuses were involved with the aneurysm.  The aortic valve leaflets were normal in appearance.  The anulus was normal in size as well.  The anulus sized for a 30-mm graft.  The distal aorta sized for a 28-mm graft.  A 30-mm sinus of Valsalva Gelweave graft was selected.  The sinuses of Valsalva were resected creating buttons for the right and left coronaries.  2-0 Ethibond pledgeted pursestring sutures then were placed circumferentially at the level of the anulus at the nadir of each cusp and maintaining the same level circumferentially.  A total of 21 sutures were placed.  These were then placed through the proximal end of the graft, which was lowered into place and the sutures were sequentially tied.  The posts for the commissures of the aortic valve were brought within the graft.  The posts then were tacked to the graft at the appropriate height with 4-0 Prolene pledgeted sutures.  The markings on the graft were used to ensure 120-degree orientation of the commissures. The 4-0 Prolene sutures then were run sewing the annular tissue to the graft finishing at the nadir of each cusp.  The graft was filled with saline and there was good apposition of the aortic valve leaflets. Thermal cautery then was used to create a hole in the graft corresponding to the site for the left main reinsertion.  The button was trimmed and the anastomosis was performed with a running 6-0 Prolene suture.  At the completion of the anastomosis, BioGlue was applied around the anastomosis.  Inspecting from inside the graft showed the lumen of the left main to be widely patent.  Next, an end-to-end anastomosis of the graft was performed to the ascending aorta.  The graft was first trimmed to length and the anastomosis was performed with a running 4-0 Prolene suture.  The graft then was distended by giving cardioplegia into the ascending aorta and a hole was made in the graft using thermal cautery  corresponding to the site for the placement of the right coronary button.  The right coronary button then was trimmed and it was anastomosed end-to-side to the graft with a running 6-0 Prolene suture.  At the completion of the right coronary artery graft, a re- animation dose of 400 mL of warm cardioplegia was administered retrograde. The patient was placed in Trendelenburg position.  De-airing maneuvers were performed through the aortic root vent.  Lidocaine was administered.  The aortic crossclamp was removed. The total crossclamp time was 177 minutes.  The patient did not require defibrillation.  He was initially in heart block, but then resumed sinus rhythm.  While rewarming was completed, the anastomoses were inspected for hemostasis.  Minor bleeding spots were noted at the graft to aorta anastomosis.  These were repaired with 4-0 Prolene pledgeted sutures. There was no bleeding proximally or at the coronary sites.  Epicardial pacing wires were placed on the right ventricle and right atrium.  When the patient had rewarmed to a core temperature of 37 degrees Celsius, the retrograde cardioplegia cannula was  removed.  The left ventricular vent was removed.  Additional de-airing was performed as the vent was removed.  The cardioplegia cannula in the ascending aorta was left in place as a vent.  There was a small amount of air visible in the ventricle and this was de-aired by filling the heart and placing an 18- gauge Angiocath through the apex.  After there were no more air bubbles seen, the Angiocath was removed and there was good hemostasis at the site.  The patient then was weaned from cardiopulmonary bypass on the first attempt without difficulty.  The total bypass time was 210 minutes.  The initial cardiac index was greater than 2 L/min/m2. Postbypass transesophageal echocardiography showed good function of the valve.  There was an 8-mm gradient across the valve.  There was no aortic  insufficiency.  There was good movement and coaptation of all 3 leaflets.  A test dose of protamine was administered and it was well tolerated. The atrial and aortic cannulae were removed.  The remainder of the protamine was administered without incident.  The chest was irrigated with warm saline.  Hemostasis was achieved.  The pericardium was reapproximated with interrupted 3-0 silk sutures.  It came together easily without tension.  A 32-French Blake drain was placed into the pericardium along the diaphragmatic surface.  A 36-French chest tube was placed in the anterior mediastinum.  Both were secured with 0 silk sutures.  The sternum was closed with a combination of single and double heavy gauge stainless steel wires.  The pectoralis fascia, subcutaneous tissue, and skin were closed in a standard fashion.  All sponge, needle, and instrument counts were correct at the end of the procedure.  The patient was taken from the operating room to the Surgical Intensive Care Unit intubated and in good condition.     Salvatore DecentSteven C. Dorris FetchHendrickson, M.D.     SCH/MEDQ  D:  09/08/2017  T:  09/08/2017  Job:  [id number]

## 2017-09-08 NOTE — Anesthesia Postprocedure Evaluation (Signed)
Anesthesia Post Note  Patient: Matthew Hoffman  Procedure(s) Performed: REPAIR OF SINUS OF VALSALVA ANEURYSM (N/A ) TRANSESOPHAGEAL ECHOCARDIOGRAM (TEE) (N/A )     Patient location during evaluation: ICU Anesthesia Type: General Level of consciousness: sedated Pain management: pain level controlled Vital Signs Assessment: post-procedure vital signs reviewed and stable Respiratory status: patient remains intubated per anesthesia plan Cardiovascular status: stable Postop Assessment: no apparent nausea or vomiting Anesthetic complications: no    Last Vitals:  Vitals:   09/08/17 1600 09/08/17 1620  BP: 108/85   Pulse: 89 89  Resp: 11 12  Temp: (!) 35.6 C (!) 35.7 C  SpO2: 100% 100%    Last Pain:  Vitals:   09/08/17 1500  TempSrc: Core (Comment)  PainSc:                  Ashlei Chinchilla

## 2017-09-08 NOTE — Progress Notes (Signed)
  Echocardiogram Echocardiogram Transesophageal has been performed.  Janalyn HarderWest, Charlies Rayburn R 09/08/2017, 9:12 AM

## 2017-09-08 NOTE — Progress Notes (Signed)
      [address]       ,[place] [id number]             [phone]      S/p aortic root replacement  BP 118/90   Pulse 89   Temp 98.1 F (36.7 C)   Resp 12   SpO2 100%   Intake/Output Summary (Last 24 hours) at 09/08/2017 1814 Last data filed at 09/08/2017 1800 Gross per 24 hour  Intake 6496.87 ml  Output 2395 ml  Net 4101.87 ml   Ci= 1.8 K= 3.9- supplemented Hct= 37  Doing well. Waking up and following commands Extubated when more alert  Viviann SpareSteven C. Dorris FetchHendrickson, MD [facility] [phone]

## 2017-09-08 NOTE — Anesthesia Procedure Notes (Signed)
Procedure Name: Intubation Date/Time: 09/08/2017 8:22 AM Performed by: Colbi Staubs T, CRNA Pre-anesthesia Checklist: Patient identified, Emergency Drugs available, Suction available and Patient being monitored Patient Re-evaluated:Patient Re-evaluated prior to induction Oxygen Delivery Method: Circle system utilized Preoxygenation: Pre-oxygenation with 100% oxygen Induction Type: IV induction Ventilation: Mask ventilation without difficulty Laryngoscope Size: Miller and 3 Grade View: Grade II Tube type: Subglottic suction tube Tube size: 8.0 mm Number of attempts: 1 Airway Equipment and Method: Patient positioned with wedge pillow and Stylet Placement Confirmation: ETT inserted through vocal cords under direct vision,  positive ETCO2 and breath sounds checked- equal and bilateral Secured at: 23 cm Tube secured with: Tape Dental Injury: Teeth and Oropharynx as per pre-operative assessment

## 2017-09-09 ENCOUNTER — Inpatient Hospital Stay (HOSPITAL_COMMUNITY): Payer: BLUE CROSS/BLUE SHIELD

## 2017-09-09 ENCOUNTER — Encounter (HOSPITAL_COMMUNITY): Payer: Self-pay | Admitting: Thoracic Surgery (Cardiothoracic Vascular Surgery)

## 2017-09-09 LAB — CREATININE, SERUM
Creatinine, Ser: 1.04 mg/dL (ref 0.61–1.24)
Creatinine, Ser: 1.16 mg/dL (ref 0.61–1.24)
GFR calc Af Amer: >60 mL/min (ref >60)
GFR calc non Af Amer: >60 mL/min (ref >60)
GFR calc non Af Amer: >60 mL/min (ref >60)

## 2017-09-09 LAB — CBC
HCT: 37.3 % — ABNORMAL LOW (ref 39.0–52.0)
HCT: 36.3 % — ABNORMAL LOW (ref 39.0–52.0)
HCT: 37.4 % — ABNORMAL LOW (ref 39.0–52.0)
HEMOGLOBIN: 12.1 g/dL — AB (ref 13.0–17.0)
Hemoglobin: 12.4 g/dL — ABNORMAL LOW (ref 13.0–17.0)
Hemoglobin: 12.3 g/dL — ABNORMAL LOW (ref 13.0–17.0)
MCH: 26.9 pg (ref 26.0–34.0)
MCH: 26.9 pg (ref 26.0–34.0)
MCH: 27 pg (ref 26.0–34.0)
MCHC: 33.2 g/dL (ref 30.0–36.0)
MCHC: 33.3 g/dL (ref 30.0–36.0)
MCHC: 32.9 g/dL (ref 30.0–36.0)
MCV: 80.9 fL (ref 78.0–100.0)
MCV: 80.8 fL (ref 78.0–100.0)
MCV: 82.2 fL (ref 78.0–100.0)
PLATELETS: 134 10*3/uL — AB (ref 150–400)
Platelets: 146 10*3/uL — ABNORMAL LOW (ref 150–400)
Platelets: 140 10*3/uL — ABNORMAL LOW (ref 150–400)
RBC: 4.61 MIL/uL (ref 4.22–5.81)
RBC: 4.49 MIL/uL (ref 4.22–5.81)
RBC: 4.55 MIL/uL (ref 4.22–5.81)
RDW: 14.1 % (ref 11.5–15.5)
RDW: 14.1 % (ref 11.5–15.5)
RDW: 14.6 % (ref 11.5–15.5)
WBC: 7 10*3/uL (ref 4.0–10.5)
WBC: 7.3 10*3/uL (ref 4.0–10.5)
WBC: 7.3 10*3/uL (ref 4.0–10.5)

## 2017-09-09 LAB — GLUCOSE, CAPILLARY
GLUCOSE-CAPILLARY: 107 mg/dL — AB (ref 65–99)
GLUCOSE-CAPILLARY: 111 mg/dL — AB (ref 65–99)
GLUCOSE-CAPILLARY: 113 mg/dL — AB (ref 65–99)
GLUCOSE-CAPILLARY: 115 mg/dL — AB (ref 65–99)
GLUCOSE-CAPILLARY: 115 mg/dL — AB (ref 65–99)
GLUCOSE-CAPILLARY: 117 mg/dL — AB (ref 65–99)
GLUCOSE-CAPILLARY: 120 mg/dL — AB (ref 65–99)
GLUCOSE-CAPILLARY: 99 mg/dL (ref 65–99)
Glucose-Capillary: 106 mg/dL — ABNORMAL HIGH (ref 65–99)
Glucose-Capillary: 107 mg/dL — ABNORMAL HIGH (ref 65–99)
Glucose-Capillary: 107 mg/dL — ABNORMAL HIGH (ref 65–99)
Glucose-Capillary: 111 mg/dL — ABNORMAL HIGH (ref 65–99)
Glucose-Capillary: 111 mg/dL — ABNORMAL HIGH (ref 65–99)
Glucose-Capillary: 113 mg/dL — ABNORMAL HIGH (ref 65–99)
Glucose-Capillary: 123 mg/dL — ABNORMAL HIGH (ref 65–99)
Glucose-Capillary: 141 mg/dL — ABNORMAL HIGH (ref 65–99)
Glucose-Capillary: 96 mg/dL (ref 65–99)

## 2017-09-09 LAB — BASIC METABOLIC PANEL
Anion gap: 7 (ref 5–15)
BUN: 13 mg/dL (ref 6–20)
CALCIUM: 7.9 mg/dL — AB (ref 8.9–10.3)
CO2: 22 mmol/L (ref 22–32)
CREATININE: 0.95 mg/dL (ref 0.61–1.24)
Chloride: 108 mmol/L (ref 101–111)
GFR calc non Af Amer: >60 mL/min (ref >60)
Glucose, Bld: 115 mg/dL — ABNORMAL HIGH (ref 65–99)
Potassium: 4.3 mmol/L (ref 3.5–5.1)
SODIUM: 137 mmol/L (ref 135–145)

## 2017-09-09 LAB — POCT I-STAT, CHEM 8
BUN: 18 mg/dL (ref 6–20)
CALCIUM ION: 1.19 mmol/L (ref 1.15–1.40)
CHLORIDE: 102 mmol/L (ref 101–111)
Creatinine, Ser: 1 mg/dL (ref 0.61–1.24)
GLUCOSE: 153 mg/dL — AB (ref 65–99)
HCT: 37 % — ABNORMAL LOW (ref 39.0–52.0)
HEMOGLOBIN: 12.6 g/dL — AB (ref 13.0–17.0)
Potassium: 3.8 mmol/L (ref 3.5–5.1)
Sodium: 139 mmol/L (ref 135–145)
TCO2: 23 mmol/L (ref 22–32)

## 2017-09-09 LAB — MAGNESIUM
MAGNESIUM: 2.4 mg/dL (ref 1.7–2.4)
Magnesium: 2.2 mg/dL (ref 1.7–2.4)

## 2017-09-09 MED ORDER — ENOXAPARIN SODIUM 40 MG/0.4ML ~~LOC~~ SOLN
40.0000 mg | Freq: Every day | SUBCUTANEOUS | Status: DC
  Administered 2017-09-09 – 2017-09-12 (×4): 40 mg via SUBCUTANEOUS
  Filled 2017-09-09 (×4): qty 0.4

## 2017-09-09 MED ORDER — INSULIN ASPART 100 UNIT/ML ~~LOC~~ SOLN
0.0000 [IU] | SUBCUTANEOUS | Status: DC
  Administered 2017-09-09 – 2017-09-11 (×8): 2 [IU] via SUBCUTANEOUS

## 2017-09-09 MED ORDER — INSULIN DETEMIR 100 UNIT/ML ~~LOC~~ SOLN
15.0000 [IU] | Freq: Every day | SUBCUTANEOUS | Status: DC
  Administered 2017-09-09 – 2017-09-11 (×3): 15 [IU] via SUBCUTANEOUS
  Filled 2017-09-09 (×4): qty 0.15

## 2017-09-09 MED ORDER — INSULIN ASPART 100 UNIT/ML ~~LOC~~ SOLN: 0.0000 [IU] | SUBCUTANEOUS | Status: DC

## 2017-09-09 MED FILL — Heparin Sodium (Porcine) Inj 1000 Unit/ML: INTRAMUSCULAR | Qty: 30 | Status: AC

## 2017-09-09 MED FILL — Magnesium Sulfate Inj 50%: INTRAMUSCULAR | Qty: 10 | Status: AC

## 2017-09-09 MED FILL — Dexmedetomidine HCl in NaCl 0.9% IV Soln 400 MCG/100ML: INTRAVENOUS | Qty: 100 | Status: AC

## 2017-09-09 MED FILL — Potassium Chloride Inj 2 mEq/ML: INTRAVENOUS | Qty: 40 | Status: AC

## 2017-09-09 NOTE — Progress Notes (Signed)
Patient ID: Matthew Hoffman, male   DOB: 03/21/1966, 52 y.o.   MRN: [id number] EVENING ROUNDS NOTE :     [address]       [place] [id number]             [phone]                 1 Day Post-Op Procedure(s) (LRB): REPAIR OF SINUS OF VALSALVA ANEURYSM (N/A) TRANSESOPHAGEAL ECHOCARDIOGRAM (TEE) (N/A)  Total Length of Stay:  LOS: 1 day  BP 109/75   Pulse (!) 179   Temp 98.2 F (36.8 C) (Oral)   Resp (!) 21   Wt 298 lb 4.5 oz (135.3 kg)   SpO2 99%   BMI 38.30 kg/m   .Intake/Output      03/14 0701 - 03/15 0700 03/15 0701 - 03/16 0700   P.O.  100   I.V. (mL/kg) 4231.9 (31.3) 332 (2.5)   Blood 1570    Other  100   NG/GT 30    IV Piggyback 1600 100   Total Intake(mL/kg) 7431.9 (54.9) 632 (4.7)   Urine (mL/kg/hr) 2015 (0.6) 175 (0.1)   Blood 975    Chest Tube 450 50   Total Output 3440 225   Net +3991.9 +407          . sodium chloride 20 mL/hr at 09/08/17 1435  . sodium chloride    . sodium chloride 20 mL/hr at 09/08/17 2026  .  ceFAZolin (ANCEF) IV 2 g (09/09/17 1624)  . dexmedetomidine (PRECEDEX) IV infusion Stopped (09/08/17 1945)  . lactated ringers    . lactated ringers 20 mL/hr at 09/09/17 1000  . nitroGLYCERIN Stopped (09/08/17 1435)  . phenylephrine (NEO-SYNEPHRINE) Adult infusion Stopped (09/09/17 1045)     Lab Results  Component Value Date   WBC 7.3 09/09/2017   HGB 12.6 (L) 09/09/2017   HCT 37.0 (L) 09/09/2017   PLT 134 (L) 09/09/2017   GLUCOSE 153 (H) 09/09/2017   ALT 20 09/06/2017   AST 23 09/06/2017   NA 139 09/09/2017   K 3.8 09/09/2017   CL 102 09/09/2017   CREATININE 1.00 09/09/2017   BUN 18 09/09/2017   CO2 22 09/09/2017   INR 1.36 09/08/2017   HGBA1C 5.8 (H) 09/06/2017   Stable day   Delight OvensEdward B Keatyn Jawad MD  Beeper [phone] Office [phone] 09/09/2017 5:36 PM

## 2017-09-09 NOTE — Progress Notes (Signed)
1 Day Post-Op Procedure(s) (LRB): REPAIR OF SINUS OF VALSALVA ANEURYSM (N/A) TRANSESOPHAGEAL ECHOCARDIOGRAM (TEE) (N/A) Subjective: "I feel pretty good"  Objective: Vital signs in last 24 hours: Temp:  [95.9 F (35.5 C)-101.8 F (38.8 C)] 100 F (37.8 C) (03/15 0700) Pulse Rate:  [69-192] 88 (03/15 0700) Cardiac Rhythm: Atrial paced (03/14 2030) Resp:  [11-22] 15 (03/15 0700) BP: (92-126)/(53-96) 105/69 (03/15 0700) SpO2:  [97 %-100 %] 100 % (03/15 0700) Arterial Line BP: (90-151)/(46-121) 97/46 (03/15 0700) FiO2 (%):  [40 %-50 %] 40 % (03/14 1852) Weight:  [298 lb 4.5 oz (135.3 kg)] 298 lb 4.5 oz (135.3 kg) (03/15 0500)  Hemodynamic parameters for last 24 hours: PAP: (21-45)/(11-30) 26/19 CO:  [3.3 L/min-6.7 L/min] 5.6 L/min CI:  [1.3 L/min/m2-2.7 L/min/m2] 2.2 L/min/m2  Intake/Output from previous day: 03/14 0701 - 03/15 0700 In: 7431.9 [I.V.:4231.9; Blood:1570; NG/GT:30; IV Piggyback:1600] Out: 3440 [Urine:2015; Blood:975; Chest Tube:450] Intake/Output this shift: No intake/output data recorded.  General appearance: alert, cooperative and no distress Neurologic: intact Heart: regular rate and rhythm Lungs: diminished breath sounds bibasilar Abdomen: normal findings: soft, non-tender  Lab Results: Recent Labs    09/08/17 2041 09/08/17 2052 09/09/17 0400  WBC 6.5  --  7.0  HGB 13.0 12.9* 12.4*  HCT 38.6* 38.0* 37.3*  PLT 146*  --  146*   BMET:  Recent Labs    09/06/17 0814  09/08/17 2052 09/09/17 0400  NA 138   < > 141 137  K 4.0   < > 4.7 4.3  CL 106   < > 106 108  CO2 22  --   --  22  GLUCOSE 109*   < > 119* 115*  BUN 15   < > 13 13  CREATININE 0.95   < > 0.80 0.95  CALCIUM 9.2  --   --  7.9*   < > = values in this interval not displayed.    PT/INR:  Recent Labs    09/08/17 1450  LABPROT 16.7*  INR 1.36   ABG    Component Value Date/Time   PHART 7.329 (L) 09/08/2017 2047   HCO3 19.2 (L) 09/08/2017 2047   TCO2 20 (L) 09/08/2017 2052   ACIDBASEDEF 6.0 (H) 09/08/2017 2047   O2SAT 99.0 09/08/2017 2047   CBG (last 3)  Recent Labs    09/09/17 0504 09/09/17 0605 09/09/17 0655  GLUCAP 115* 111* 106*    Assessment/Plan: S/P Procedure(s) (LRB): REPAIR OF SINUS OF VALSALVA ANEURYSM (N/A) TRANSESOPHAGEAL ECHOCARDIOGRAM (TEE) (N/A) -Doing well  CV- stable hemodynamics- dc swan and A line  ASA, beta blocker  RESP- IS  RENAL- creatinine and lytes OK  ENDO- CBG well controlled- transition to SSI  Advance diet  DC chest tubes  Mobilize    LOS: 1 day    Matthew Hoffman 09/09/2017

## 2017-09-10 ENCOUNTER — Inpatient Hospital Stay (HOSPITAL_COMMUNITY): Payer: BLUE CROSS/BLUE SHIELD

## 2017-09-10 LAB — BASIC METABOLIC PANEL
Anion gap: 7 (ref 5–15)
BUN: 20 mg/dL (ref 6–20)
CO2: 24 mmol/L (ref 22–32)
Calcium: 8.3 mg/dL — ABNORMAL LOW (ref 8.9–10.3)
Chloride: 105 mmol/L (ref 101–111)
Creatinine, Ser: 1.19 mg/dL (ref 0.61–1.24)
GFR calc Af Amer: >60 mL/min (ref >60)
Glucose, Bld: 136 mg/dL — ABNORMAL HIGH (ref 65–99)
Potassium: 4 mmol/L (ref 3.5–5.1)
SODIUM: 136 mmol/L (ref 135–145)

## 2017-09-10 LAB — CBC
HCT: 35.4 % — ABNORMAL LOW (ref 39.0–52.0)
Hemoglobin: 11.8 g/dL — ABNORMAL LOW (ref 13.0–17.0)
MCH: 27.2 pg (ref 26.0–34.0)
MCHC: 33.3 g/dL (ref 30.0–36.0)
MCV: 81.6 fL (ref 78.0–100.0)
Platelets: 126 10*3/uL — ABNORMAL LOW (ref 150–400)
RBC: 4.34 MIL/uL (ref 4.22–5.81)
RDW: 14.2 % (ref 11.5–15.5)
WBC: 9.9 10*3/uL (ref 4.0–10.5)

## 2017-09-10 LAB — GLUCOSE, CAPILLARY
GLUCOSE-CAPILLARY: 123 mg/dL — AB (ref 65–99)
Glucose-Capillary: 130 mg/dL — ABNORMAL HIGH (ref 65–99)
Glucose-Capillary: 136 mg/dL — ABNORMAL HIGH (ref 65–99)
Glucose-Capillary: 131 mg/dL — ABNORMAL HIGH (ref 65–99)

## 2017-09-10 MED ORDER — INFLUENZA VAC SPLIT QUAD 0.5 ML IM SUSY
0.5000 mL | PREFILLED_SYRINGE | INTRAMUSCULAR | Status: AC
  Administered 2017-09-11: 0.5 mL via INTRAMUSCULAR
  Filled 2017-09-10: qty 0.5

## 2017-09-10 MED ORDER — PNEUMOCOCCAL VAC POLYVALENT 25 MCG/0.5ML IJ INJ
0.5000 mL | INJECTION | INTRAMUSCULAR | Status: AC
  Administered 2017-09-11: 0.5 mL via INTRAMUSCULAR

## 2017-09-10 MED ORDER — AMIODARONE HCL IN DEXTROSE 360-4.14 MG/200ML-% IV SOLN
60.0000 mg/h | INTRAVENOUS | Status: AC
  Administered 2017-09-10 (×2): 60 mg/h via INTRAVENOUS
  Filled 2017-09-10 (×3): qty 200

## 2017-09-10 MED ORDER — AMIODARONE IV BOLUS ONLY 150 MG/100ML: 150.0000 mg | Freq: Once | INTRAVENOUS | Status: AC

## 2017-09-10 MED ORDER — AMIODARONE HCL 200 MG PO TABS
400.0000 mg | ORAL_TABLET | Freq: Two times a day (BID) | ORAL | Status: DC
  Administered 2017-09-11 – 2017-09-15 (×9): 400 mg via ORAL
  Filled 2017-09-10 (×9): qty 2

## 2017-09-10 MED ORDER — AMIODARONE LOAD VIA INFUSION
150.0000 mg | Freq: Once | INTRAVENOUS | Status: AC
  Administered 2017-09-10: 150 mg via INTRAVENOUS
  Filled 2017-09-10: qty 83.34

## 2017-09-10 MED ORDER — AMIODARONE HCL 200 MG PO TABS: 400.0000 mg | ORAL_TABLET | Freq: Every day | ORAL | Status: DC

## 2017-09-10 MED ORDER — AMIODARONE HCL IN DEXTROSE 360-4.14 MG/200ML-% IV SOLN
30.0000 mg/h | INTRAVENOUS | Status: DC
  Administered 2017-09-10 (×2): 30 mg/h via INTRAVENOUS
  Filled 2017-09-10: qty 200

## 2017-09-10 NOTE — Plan of Care (Signed)
  Clinical Measurements: Ability to maintain clinical measurements within normal limits will improve 09/10/2017 0424 - Progressing by Angelica Pou, Trilby Drummer, RN Will remain free from infection 09/10/2017 0424 - Progressing by Tish Frederickson, RN Diagnostic test results will improve 09/10/2017 0424 - Completed/Met by Tish Frederickson, RN Respiratory complications will improve 09/10/2017 0424 - Completed/Met by Tish Frederickson, RN Cardiovascular complication will be avoided 09/10/2017 0424 - Progressing by Tish Frederickson, RN   Activity: Risk for activity intolerance will decrease 09/10/2017 0424 - Progressing by Tish Frederickson, RN   Coping: Level of anxiety will decrease 09/10/2017 0424 - Progressing by Tish Frederickson, RN

## 2017-09-10 NOTE — Progress Notes (Signed)
Patient ID: Matthew Hoffman, male   DOB: Apr 05, 1966, 52 y.o.   MRN: [id number] [facility] DAILY ICU PROGRESS NOTE                   [address]            [name] [address]          [phone]   2 Days Post-Op Procedure(s) (LRB): REPAIR OF SINUS OF VALSALVA ANEURYSM (N/A) TRANSESOPHAGEAL ECHOCARDIOGRAM (TEE) (N/A)  Total Length of Stay:  LOS: 2 days   Subjective: Patient awake alert neurologically intact, some trouble with using BiPAP during the night as the mask was not comfortable  Objective: Vital signs in last 24 hours: Temp:  [98.2 F (36.8 C)-99.9 F (37.7 C)] 98.7 F (37.1 C) (03/16 0803) Pulse Rate:  [43-179] 80 (03/16 0800) Cardiac Rhythm: Atrial paced (03/16 0400) Resp:  [16-27] 24 (03/16 0800) BP: (89-139)/(58-95) 116/95 (03/16 0800) SpO2:  [91 %-100 %] 96 % (03/16 0800) Arterial Line BP: (101-137)/(42-77) 118/46 (03/15 1400) Weight:  [298 lb 8.1 oz (135.4 kg)] 298 lb 8.1 oz (135.4 kg) (03/16 0500)  Filed Weights   09/09/17 0500 09/10/17 0500  Weight: 298 lb 4.5 oz (135.3 kg) 298 lb 8.1 oz (135.4 kg)    Weight change: 3.5 oz (0.1 kg)   Hemodynamic parameters for last 24 hours: PAP: (28)/(16-17) 28/17 CO:  [5.8 L/min] 5.8 L/min CI:  [2.2 L/min/m2] 2.2 L/min/m2  Intake/Output from previous day: 03/15 0701 - 03/16 0700 In: 972 [P.O.:340; I.V.:332; IV Piggyback:200] Out: 820 [Urine:770; Chest Tube:50]  Intake/Output this shift: No intake/output data recorded.  Current Meds: Scheduled Meds: . acetaminophen  1,000 mg Oral Q6H   Or  . acetaminophen (TYLENOL) oral liquid 160 mg/5 mL  1,000 mg Per Tube Q6H  . aspirin EC  325 mg Oral Daily   Or  . aspirin  324 mg Per Tube Daily  . bisacodyl  10 mg Oral Daily   Or  . bisacodyl  10 mg Rectal Daily  . Chlorhexidine Gluconate Cloth  6 each Topical Daily  . docusate sodium  200 mg Oral Daily  . enoxaparin (LOVENOX) injection  40 mg Subcutaneous QHS  . [START ON 09/11/2017] Influenza vac split  quadrivalent PF  0.5 mL Intramuscular Tomorrow-1000  . insulin aspart  0-24 Units Subcutaneous Q4H  . insulin detemir  15 Units Subcutaneous Daily  . insulin regular  0-10 Units Intravenous TID WC  . mouth rinse  15 mL Mouth Rinse BID  . metoprolol tartrate  12.5 mg Oral BID   Or  . metoprolol tartrate  12.5 mg Per Tube BID  . pantoprazole  40 mg Oral Daily  . [START ON 09/11/2017] pneumococcal 23 valent vaccine  0.5 mL Intramuscular Tomorrow-1000  . sodium chloride flush  10-40 mL Intracatheter Q12H  . sodium chloride flush  3 mL Intravenous Q12H  . tamsulosin  0.4 mg Oral QHS   Continuous Infusions: . sodium chloride 20 mL/hr at 09/08/17 1435  . sodium chloride    . sodium chloride 20 mL/hr at 09/08/17 2026  .  ceFAZolin (ANCEF) IV Stopped (09/10/17 0117)  . dexmedetomidine (PRECEDEX) IV infusion Stopped (09/08/17 1945)  . lactated ringers    . lactated ringers 20 mL/hr at 09/09/17 1000  . nitroGLYCERIN Stopped (09/08/17 1435)  . phenylephrine (NEO-SYNEPHRINE) Adult infusion Stopped (09/09/17 1045)   PRN Meds:.sodium chloride, metoprolol tartrate, midazolam, morphine injection, ondansetron (ZOFRAN) IV, oxyCODONE, sodium chloride flush, sodium chloride flush, traMADol  General appearance: alert, cooperative and no distress Neurologic: intact Heart: irregularly irregular rhythm Lungs: diminished breath sounds bibasilar Abdomen: soft, non-tender; bowel sounds normal; no masses,  no organomegaly Extremities: extremities normal, atraumatic, no cyanosis or edema and Homans sign is negative, no sign of DVT Wound: Sternum is stable  Lab Results: CBC: Recent Labs    09/09/17 1641 09/09/17 1642 09/10/17 0442  WBC 7.3  --  9.9  HGB 12.3* 12.6* 11.8*  HCT 37.4* 37.0* 35.4*  PLT 134*  --  126*   BMET:  Recent Labs    09/09/17 0400  09/09/17 1642 09/10/17 0442  NA 137  --  139 136  K 4.3  --  3.8 4.0  CL 108  --  102 105  CO2 22  --   --  24  GLUCOSE 115*  --  153* 136*    BUN 13  --  18 20  CREATININE 0.95   < > 1.00 1.19  CALCIUM 7.9*  --   --  8.3*   < > = values in this interval not displayed.    CMET: Lab Results  Component Value Date   WBC 9.9 09/10/2017   HGB 11.8 (L) 09/10/2017   HCT 35.4 (L) 09/10/2017   PLT 126 (L) 09/10/2017   GLUCOSE 136 (H) 09/10/2017   ALT 20 09/06/2017   AST 23 09/06/2017   NA 136 09/10/2017   K 4.0 09/10/2017   CL 105 09/10/2017   CREATININE 1.19 09/10/2017   BUN 20 09/10/2017   CO2 24 09/10/2017   INR 1.36 09/08/2017   HGBA1C 5.8 (H) 09/06/2017      PT/INR:  Recent Labs    09/08/17 1450  LABPROT 16.7*  INR 1.36   Radiology: Dg Chest Port 1 View  Result Date: 09/10/2017 CLINICAL DATA:  Ascending aortic aneurysm EXAM: PORTABLE CHEST 1 VIEW COMPARISON:  09/09/2017 FINDINGS: Sternotomy wires overlie normal cardiac silhouette. Central venous line unchanged. Low lung volumes. No pulmonary edema. Mild LEFT basilar atelectasis. IMPRESSION: Increase in LEFT basilar atelectasis. Electronically Signed   By: Genevive BiStewart  Edmunds M.D.   On: 09/10/2017 07:20   Dg Chest Port 1 View  Result Date: 09/09/2017 CLINICAL DATA:  Chest tube removal. Recent thoracic aortic aneurysm repair. EXAM: PORTABLE CHEST 1 VIEW 12:44 p.m. COMPARISON:  09/09/2017 at 5:53 a.m. and 09/08/2017 FINDINGS: Chest tube and Swan-Ganz catheter has been removed. Sheath remains in the superior vena cava. No pneumothorax. Heart size and vascularity are normal. Lungs are clear. No effusions. IMPRESSION: No acute abnormality. Specifically, no pneumothorax after chest tube removal. Electronically Signed   By: Francene BoyersJames  Maxwell M.D.   On: 09/09/2017 12:57     Assessment/Plan: S/P Procedure(s) (LRB): REPAIR OF SINUS OF VALSALVA ANEURYSM (N/A) TRANSESOPHAGEAL ECHOCARDIOGRAM (TEE) (N/A) Mobilize Diuresis DC Foley Patient now in atrial fibrillation, will start amiodarone if persists will need anticoagulation    Delight Ovensdward B Katalyn Matin 09/10/2017 8:21 AM

## 2017-09-10 NOTE — Progress Notes (Signed)
Patient ID: Matthew Hoffman, male   DOB: 09/01/1965, 52 y.o.   MRN: [id number] EVENING ROUNDS NOTE :     [address]       [place] [id number]             [phone]                 2 Days Post-Op Procedure(s) (LRB): REPAIR OF SINUS OF VALSALVA ANEURYSM (N/A) TRANSESOPHAGEAL ECHOCARDIOGRAM (TEE) (N/A)  Total Length of Stay:  LOS: 2 days  BP 127/84   Pulse 87   Temp 98.3 F (36.8 C) (Oral)   Resp (!) 23   Wt 298 lb 8.1 oz (135.4 kg)   SpO2 99%   BMI 38.33 kg/m   .Intake/Output      03/16 0701 - 03/17 0700   P.O. 720   I.V. (mL/kg) 283.3 (2.1)   Other    IV Piggyback 100   Total Intake(mL/kg) 1103.3 (8.1)   Urine (mL/kg/hr) 495 (0.3)   Chest Tube    Total Output 495   Net +608.3         . sodium chloride 20 mL/hr at 09/08/17 1435  . sodium chloride    . sodium chloride 20 mL/hr at 09/08/17 2026  . amiodarone 30 mg/hr (09/10/17 1514)  . dexmedetomidine (PRECEDEX) IV infusion Stopped (09/08/17 1945)  . lactated ringers    . lactated ringers 20 mL/hr at 09/09/17 1000  . nitroGLYCERIN Stopped (09/08/17 1435)  . phenylephrine (NEO-SYNEPHRINE) Adult infusion Stopped (09/09/17 1045)     Lab Results  Component Value Date   WBC 9.9 09/10/2017   HGB 11.8 (L) 09/10/2017   HCT 35.4 (L) 09/10/2017   PLT 126 (L) 09/10/2017   GLUCOSE 136 (H) 09/10/2017   ALT 20 09/06/2017   AST 23 09/06/2017   NA 136 09/10/2017   K 4.0 09/10/2017   CL 105 09/10/2017   CREATININE 1.19 09/10/2017   BUN 20 09/10/2017   CO2 24 09/10/2017   INR 1.36 09/08/2017   HGBA1C 5.8 (H) 09/06/2017   Feels ok this evening Still in afib, earlier was in flutter , rapid a pacing convert to a fib   Delight OvensEdward B Wadie Liew MD  Beeper [phone] Office [phone] 09/10/2017 7:02 PM

## 2017-09-11 LAB — GLUCOSE, CAPILLARY
GLUCOSE-CAPILLARY: 105 mg/dL — AB (ref 65–99)
GLUCOSE-CAPILLARY: 129 mg/dL — AB (ref 65–99)
GLUCOSE-CAPILLARY: 129 mg/dL — AB (ref 65–99)
Glucose-Capillary: 109 mg/dL — ABNORMAL HIGH (ref 65–99)
Glucose-Capillary: 114 mg/dL — ABNORMAL HIGH (ref 65–99)
Glucose-Capillary: 130 mg/dL — ABNORMAL HIGH (ref 65–99)
Glucose-Capillary: 117 mg/dL — ABNORMAL HIGH (ref 65–99)

## 2017-09-11 LAB — BASIC METABOLIC PANEL
ANION GAP: 9 (ref 5–15)
BUN: 15 mg/dL (ref 6–20)
CO2: 25 mmol/L (ref 22–32)
Calcium: 8.1 mg/dL — ABNORMAL LOW (ref 8.9–10.3)
Chloride: 101 mmol/L (ref 101–111)
Creatinine, Ser: 1.06 mg/dL (ref 0.61–1.24)
GFR calc Af Amer: >60 mL/min (ref >60)
Glucose, Bld: 121 mg/dL — ABNORMAL HIGH (ref 65–99)
Potassium: 3.7 mmol/L (ref 3.5–5.1)
SODIUM: 135 mmol/L (ref 135–145)

## 2017-09-11 LAB — TSH: TSH: 3.136 u[IU]/mL (ref 0.350–4.500)

## 2017-09-11 LAB — MAGNESIUM: Magnesium: 2 mg/dL (ref 1.7–2.4)

## 2017-09-11 MED ORDER — POTASSIUM CHLORIDE 10 MEQ/50ML IV SOLN
10.0000 meq | INTRAVENOUS | Status: DC | PRN
  Administered 2017-09-11: 10 meq via INTRAVENOUS
  Filled 2017-09-11: qty 50

## 2017-09-11 MED ORDER — METOPROLOL TARTRATE 25 MG/10 ML ORAL SUSPENSION: 25.0000 mg | Freq: Two times a day (BID) | ORAL | Status: DC

## 2017-09-11 MED ORDER — LACTULOSE 10 GM/15ML PO SOLN
20.0000 g | Freq: Every day | ORAL | Status: DC | PRN
  Administered 2017-09-11 – 2017-09-12 (×2): 20 g via ORAL
  Filled 2017-09-11 (×2): qty 30

## 2017-09-11 MED ORDER — METOPROLOL TARTRATE 25 MG PO TABS
25.0000 mg | ORAL_TABLET | Freq: Two times a day (BID) | ORAL | Status: DC
  Administered 2017-09-11 – 2017-09-12 (×4): 25 mg via ORAL
  Filled 2017-09-11 (×4): qty 1

## 2017-09-11 MED ORDER — POTASSIUM CHLORIDE 10 MEQ/50ML IV SOLN
10.0000 meq | INTRAVENOUS | Status: AC
  Administered 2017-09-11 (×2): 10 meq via INTRAVENOUS
  Filled 2017-09-11 (×2): qty 50

## 2017-09-11 NOTE — Progress Notes (Signed)
Patient ID: Matthew Hoffman, male   DOB: 10/20/1965, 52 y.o.   MRN: [id number] EVENING ROUNDS NOTE :     [address]       [place] [id number]             [phone]                 3 Days Post-Op Procedure(s) (LRB): REPAIR OF SINUS OF VALSALVA ANEURYSM (N/A) TRANSESOPHAGEAL ECHOCARDIOGRAM (TEE) (N/A)  Total Length of Stay:  LOS: 3 days  BP (!) 120/92   Pulse (!) 117   Temp 98.5 F (36.9 C) (Oral)   Resp 19   Wt 297 lb 9.6 oz (135 kg)   SpO2 97%   BMI 38.21 kg/m   .Intake/Output      03/16 0701 - 03/17 0700 03/17 0701 - 03/18 0700   P.O. 1080 720   I.V. (mL/kg) 467 (3.5) 57.6 (0.4)   Other     IV Piggyback 100 100   Total Intake(mL/kg) 1647 (12.2) 877.6 (6.5)   Urine (mL/kg/hr) 495 (0.2) 300 (0.2)   Chest Tube     Total Output 495 300   Net +1152 +577.6        Urine Occurrence 3 x      . sodium chloride    . amiodarone Stopped (09/11/17 1027)  . dexmedetomidine (PRECEDEX) IV infusion Stopped (09/08/17 1945)  . lactated ringers    . lactated ringers 20 mL/hr at 09/09/17 1000  . nitroGLYCERIN Stopped (09/08/17 1435)  . phenylephrine (NEO-SYNEPHRINE) Adult infusion Stopped (09/09/17 1045)     Lab Results  Component Value Date   WBC 9.9 09/10/2017   HGB 11.8 (L) 09/10/2017   HCT 35.4 (L) 09/10/2017   PLT 126 (L) 09/10/2017   GLUCOSE 121 (H) 09/11/2017   ALT 20 09/06/2017   AST 23 09/06/2017   NA 135 09/11/2017   K 3.7 09/11/2017   CL 101 09/11/2017   CREATININE 1.06 09/11/2017   BUN 15 09/11/2017   CO2 25 09/11/2017   TSH 3.136 09/11/2017   INR 1.36 09/08/2017   HGBA1C 5.8 (H) 09/06/2017   Sinus 110 now, beta blocker increased  Brief episode afib today On po Cordarone  Central line out in am  Delight OvensEdward B Longino Trefz MD  Beeper [phone] Office [phone] 09/11/2017 7:00 PM

## 2017-09-11 NOTE — Progress Notes (Signed)
T[facility] DAILY ICU PROGRESS NOTE                   [address]            [place] [id number]          [phone]   3 Days Post-Op Procedure(s) (LRB): REPAIR OF SINUS OF VALSALVA ANEURYSM (N/A) TRANSESOPHAGEAL ECHOCARDIOGRAM (TEE) (N/A)  Total Length of Stay:  LOS: 3 days   Subjective: Up in chair this morning feels better, ambulating  Objective: Vital signs in last 24 hours: Temp:  [98.2 F (36.8 C)-99.5 F (37.5 C)] 98.2 F (36.8 C) (03/17 0800) Pulse Rate:  [77-118] 116 (03/17 0800) Cardiac Rhythm: Sinus tachycardia (03/17 0800) Resp:  [10-27] 21 (03/17 0800) BP: (108-149)/(69-108) 108/77 (03/17 0800) SpO2:  [92 %-100 %] 97 % (03/17 0800) Weight:  [297 lb 9.6 oz (135 kg)] 297 lb 9.6 oz (135 kg) (03/17 0600)  Filed Weights   09/09/17 0500 09/10/17 0500 09/11/17 0600  Weight: 298 lb 4.5 oz (135.3 kg) 298 lb 8.1 oz (135.4 kg) 297 lb 9.6 oz (135 kg)    Weight change: -14.5 oz (-0.41 kg)   Hemodynamic parameters for last 24 hours:    Intake/Output from previous day: 03/16 0701 - 03/17 0700 In: 1647 [P.O.:1080; I.V.:467; IV Piggyback:100] Out: 495 [Urine:495]  Intake/Output this shift: Total I/O In: 66.7 [I.V.:16.7; IV Piggyback:50] Out: -   Current Meds: Scheduled Meds: . acetaminophen  1,000 mg Oral Q6H   Or  . acetaminophen (TYLENOL) oral liquid 160 mg/5 mL  1,000 mg Per Tube Q6H  . amiodarone  400 mg Oral Q12H   Followed by  . [START ON 09/18/2017] amiodarone  400 mg Oral Daily  . aspirin EC  325 mg Oral Daily   Or  . aspirin  324 mg Per Tube Daily  . bisacodyl  10 mg Oral Daily   Or  . bisacodyl  10 mg Rectal Daily  . Chlorhexidine Gluconate Cloth  6 each Topical Daily  . docusate sodium  200 mg Oral Daily  . enoxaparin (LOVENOX) injection  40 mg Subcutaneous QHS  . Influenza vac split quadrivalent PF  0.5 mL Intramuscular Tomorrow-1000  . insulin aspart  0-24 Units Subcutaneous Q4H  . insulin detemir  15 Units Subcutaneous Daily  .  insulin regular  0-10 Units Intravenous TID WC  . mouth rinse  15 mL Mouth Rinse BID  . metoprolol tartrate  12.5 mg Oral BID   Or  . metoprolol tartrate  12.5 mg Per Tube BID  . pantoprazole  40 mg Oral Daily  . pneumococcal 23 valent vaccine  0.5 mL Intramuscular Tomorrow-1000  . sodium chloride flush  10-40 mL Intracatheter Q12H  . sodium chloride flush  3 mL Intravenous Q12H  . tamsulosin  0.4 mg Oral QHS   Continuous Infusions: . sodium chloride 20 mL/hr at 09/08/17 1435  . sodium chloride    . sodium chloride 20 mL/hr at 09/08/17 2026  . amiodarone 30 mg/hr (09/11/17 0700)  . dexmedetomidine (PRECEDEX) IV infusion Stopped (09/08/17 1945)  . lactated ringers    . lactated ringers 20 mL/hr at 09/09/17 1000  . nitroGLYCERIN Stopped (09/08/17 1435)  . phenylephrine (NEO-SYNEPHRINE) Adult infusion Stopped (09/09/17 1045)  . potassium chloride 10 mEq (09/11/17 0823)   PRN Meds:.sodium chloride, metoprolol tartrate, midazolam, morphine injection, ondansetron (ZOFRAN) IV, oxyCODONE, sodium chloride flush, sodium chloride flush, traMADol  General appearance: alert, cooperative and no distress Neurologic: intact Heart: regular  rate and rhythm, S1, S2 normal, no murmur, click, rub or gallop Lungs: clear to auscultation bilaterally Abdomen: soft, non-tender; bowel sounds normal; no masses,  no organomegaly Extremities: extremities normal, atraumatic, no cyanosis or edema and Homans sign is negative, no sign of DVT Wound: Sternum stable  Lab Results: CBC: Recent Labs    09/09/17 1641 09/09/17 1642 09/10/17 0442  WBC 7.3  --  9.9  HGB 12.3* 12.6* 11.8*  HCT 37.4* 37.0* 35.4*  PLT 134*  --  126*   BMET:  Recent Labs    09/10/17 0442 09/11/17 0533  NA 136 135  K 4.0 3.7  CL 105 101  CO2 24 25  GLUCOSE 136* 121*  BUN 20 15  CREATININE 1.19 1.06  CALCIUM 8.3* 8.1*    CMET: Lab Results  Component Value Date   WBC 9.9 09/10/2017   HGB 11.8 (L) 09/10/2017   HCT 35.4  (L) 09/10/2017   PLT 126 (L) 09/10/2017   GLUCOSE 121 (H) 09/11/2017   ALT 20 09/06/2017   AST 23 09/06/2017   NA 135 09/11/2017   K 3.7 09/11/2017   CL 101 09/11/2017   CREATININE 1.06 09/11/2017   BUN 15 09/11/2017   CO2 25 09/11/2017   TSH 3.136 09/11/2017   INR 1.36 09/08/2017   HGBA1C 5.8 (H) 09/06/2017      PT/INR:  Recent Labs    09/08/17 1450  LABPROT 16.7*  INR 1.36   Radiology: No results found.   Assessment/Plan: S/P Procedure(s) (LRB): REPAIR OF SINUS OF VALSALVA ANEURYSM (N/A) TRANSESOPHAGEAL ECHOCARDIOGRAM (TEE) (N/A) Mobilize Diuresis Now in sinus rhythm-sinus tach convert to p.o. amiodarone Increased dose of beta-blocker   Matthew Hoffman 09/11/2017 9:00 AM

## 2017-09-11 NOTE — Plan of Care (Signed)
Pt is ambulating to bathroom w/ little to no assistance and walks several times a day in the halls. Pt presents w/ a fair appetite and completed 75% of his dinner. Pt was experiencing constipation but had a BM during this shift. Pt intermittently complains of pain and received PRN pain medication.

## 2017-09-12 ENCOUNTER — Inpatient Hospital Stay (HOSPITAL_COMMUNITY): Payer: BLUE CROSS/BLUE SHIELD

## 2017-09-12 ENCOUNTER — Other Ambulatory Visit: Payer: Self-pay

## 2017-09-12 LAB — BASIC METABOLIC PANEL
Anion gap: 9 (ref 5–15)
BUN: 15 mg/dL (ref 6–20)
CO2: 25 mmol/L (ref 22–32)
CREATININE: 1.05 mg/dL (ref 0.61–1.24)
Calcium: 8 mg/dL — ABNORMAL LOW (ref 8.9–10.3)
Chloride: 100 mmol/L — ABNORMAL LOW (ref 101–111)
GFR calc non Af Amer: >60 mL/min (ref >60)
Glucose, Bld: 121 mg/dL — ABNORMAL HIGH (ref 65–99)
Potassium: 3.4 mmol/L — ABNORMAL LOW (ref 3.5–5.1)
Sodium: 134 mmol/L — ABNORMAL LOW (ref 135–145)

## 2017-09-12 LAB — CBC
HCT: 31.8 % — ABNORMAL LOW (ref 39.0–52.0)
Hemoglobin: 10.8 g/dL — ABNORMAL LOW (ref 13.0–17.0)
MCH: 27.1 pg (ref 26.0–34.0)
MCHC: 34 g/dL (ref 30.0–36.0)
MCV: 79.7 fL (ref 78.0–100.0)
Platelets: 178 10*3/uL (ref 150–400)
RBC: 3.99 MIL/uL — ABNORMAL LOW (ref 4.22–5.81)
RDW: 14 % (ref 11.5–15.5)
WBC: 6.8 10*3/uL (ref 4.0–10.5)

## 2017-09-12 LAB — GLUCOSE, CAPILLARY
Glucose-Capillary: 110 mg/dL — ABNORMAL HIGH (ref 65–99)
Glucose-Capillary: 110 mg/dL — ABNORMAL HIGH (ref 65–99)
Glucose-Capillary: 110 mg/dL — ABNORMAL HIGH (ref 65–99)

## 2017-09-12 MED ORDER — SODIUM CHLORIDE 0.9% FLUSH
3.0000 mL | Freq: Two times a day (BID) | INTRAVENOUS | Status: DC
  Administered 2017-09-12 – 2017-09-14 (×4): 3 mL via INTRAVENOUS

## 2017-09-12 MED ORDER — ASPIRIN EC 81 MG PO TBEC
81.0000 mg | DELAYED_RELEASE_TABLET | Freq: Every day | ORAL | Status: DC
  Administered 2017-09-12 – 2017-09-15 (×4): 81 mg via ORAL
  Filled 2017-09-12 (×4): qty 1

## 2017-09-12 MED ORDER — PATIENT'S GUIDE TO USING COUMADIN BOOK
Freq: Once | Status: AC
  Administered 2017-09-12: 18:00:00
  Filled 2017-09-12: qty 1

## 2017-09-12 MED ORDER — SODIUM CHLORIDE 0.9 % IV SOLN
250.0000 mL | INTRAVENOUS | Status: DC | PRN
  Administered 2017-09-15: 11:00:00 via INTRAVENOUS

## 2017-09-12 MED ORDER — AMIODARONE IV BOLUS ONLY 150 MG/100ML
150.0000 mg | Freq: Once | INTRAVENOUS | Status: AC
  Administered 2017-09-12: 150 mg via INTRAVENOUS
  Filled 2017-09-12: qty 100

## 2017-09-12 MED ORDER — ZOLPIDEM TARTRATE 5 MG PO TABS: 5.0000 mg | ORAL_TABLET | Freq: Every evening | ORAL | Status: DC | PRN

## 2017-09-12 MED ORDER — SODIUM CHLORIDE 0.9% FLUSH: 3.0000 mL | INTRAVENOUS | Status: DC | PRN

## 2017-09-12 MED ORDER — WARFARIN SODIUM 5 MG PO TABS
5.0000 mg | ORAL_TABLET | Freq: Every day | ORAL | Status: DC
  Administered 2017-09-12: 5 mg via ORAL
  Filled 2017-09-12: qty 1

## 2017-09-12 MED ORDER — MAGNESIUM HYDROXIDE 400 MG/5ML PO SUSP: 30.0000 mL | Freq: Every day | ORAL | Status: DC | PRN

## 2017-09-12 MED ORDER — POTASSIUM CHLORIDE 10 MEQ/50ML IV SOLN
10.0000 meq | INTRAVENOUS | Status: AC
  Administered 2017-09-12 (×5): 10 meq via INTRAVENOUS
  Filled 2017-09-12 (×5): qty 50

## 2017-09-12 MED ORDER — ALUM & MAG HYDROXIDE-SIMETH 200-200-20 MG/5ML PO SUSP: 15.0000 mL | Freq: Four times a day (QID) | ORAL | Status: DC | PRN

## 2017-09-12 MED ORDER — POTASSIUM CHLORIDE 10 MEQ/50ML IV SOLN
10.0000 meq | INTRAVENOUS | Status: DC
  Administered 2017-09-12: 10 meq via INTRAVENOUS
  Filled 2017-09-12: qty 50

## 2017-09-12 MED ORDER — MOVING RIGHT ALONG BOOK
Freq: Once | Status: AC
  Administered 2017-09-12: 21:00:00
  Filled 2017-09-12: qty 1

## 2017-09-12 MED ORDER — WARFARIN - PHYSICIAN DOSING INPATIENT: Freq: Every day | Status: DC

## 2017-09-12 NOTE — Progress Notes (Signed)
4 Days Post-Op Procedure(s) (LRB): REPAIR OF SINUS OF VALSALVA ANEURYSM (N/A) TRANSESOPHAGEAL ECHOCARDIOGRAM (TEE) (N/A) Subjective: Some incisional pain, not severe  Objective: Vital signs in last 24 hours: Temp:  [98.1 F (36.7 C)-99.8 F (37.7 C)] 98.3 F (36.8 C) (03/18 0736) Pulse Rate:  [73-122] 118 (03/18 0300) Cardiac Rhythm: Sinus tachycardia;Atrial fibrillation (03/18 0400) Resp:  [12-31] 12 (03/18 0400) BP: (84-135)/(63-106) 108/93 (03/18 0400) SpO2:  [94 %-99 %] 97 % (03/18 0400) Weight:  [318 lb 5.5 oz (144.4 kg)] 318 lb 5.5 oz (144.4 kg) (03/18 0500)  Hemodynamic parameters for last 24 hours:    Intake/Output from previous day: 03/17 0701 - 03/18 0700 In: 1537.6 [P.O.:1380; I.V.:57.6; IV Piggyback:100] Out: 300 [Urine:300] Intake/Output this shift: No intake/output data recorded.  General appearance: alert, cooperative and no distress Neurologic: intact Heart: tachy, regular Lungs: clear to auscultation bilaterally Abdomen: normal findings: soft, non-tender Wound: clean and dry  Lab Results: Recent Labs    09/10/17 0442 09/12/17 0505  WBC 9.9 6.8  HGB 11.8* 10.8*  HCT 35.4* 31.8*  PLT 126* 178   BMET:  Recent Labs    09/11/17 0533 09/12/17 0505  NA 135 134*  K 3.7 3.4*  CL 101 100*  CO2 25 25  GLUCOSE 121* 121*  BUN 15 15  CREATININE 1.06 1.05  CALCIUM 8.1* 8.0*    PT/INR: No results for input(s): LABPROT, INR in the last 72 hours. ABG    Component Value Date/Time   PHART 7.329 (L) 09/08/2017 2047   HCO3 19.2 (L) 09/08/2017 2047   TCO2 23 09/09/2017 1642   ACIDBASEDEF 6.0 (H) 09/08/2017 2047   O2SAT 99.0 09/08/2017 2047   CBG (last 3)  Recent Labs    09/11/17 1939 09/11/17 2307 09/12/17 0357  GLUCAP 129* 105* 110*    Assessment/Plan: S/P Procedure(s) (LRB): REPAIR OF SINUS OF VALSALVA ANEURYSM (N/A) TRANSESOPHAGEAL ECHOCARDIOGRAM (TEE) (N/A) Plan for transfer to step-down: see transfer orders  CV- in atrial flutter this  AM- rapid atrially paced- now in and out of SR/A fib  Will rebolus amiodarone, continue PO amiodarone  Continue metoprolol  RESP- no issues  RENAL- hypokalemia- supplement. Creatinine normal  ENDO_ CBG OK- dc CBG  Anemia secondary to ABL- mild, follow  Continue cardiac rehab   LOS: 4 days    Loreli SlotSteven C Alejandria Wessells 09/12/2017

## 2017-09-12 NOTE — Discharge Instructions (Addendum)
Information on my medicine - ELIQUIS (apixaban)  This medication education was reviewed with me or my healthcare representative as part of my discharge preparation.  Why was Eliquis prescribed for you? Eliquis was prescribed for you to reduce the risk of a blood clot forming that can cause a stroke if you have a medical condition called atrial fibrillation (a type of irregular heartbeat).  What do You need to know about Eliquis ? Take your Eliquis TWICE DAILY - one tablet in the morning and one tablet in the evening with or without food. If you have difficulty swallowing the tablet whole please discuss with your pharmacist how to take the medication safely.  Take Eliquis exactly as prescribed by your doctor and DO NOT stop taking Eliquis without talking to the doctor who prescribed the medication.  Stopping may increase your risk of developing a stroke.  Refill your prescription before you run out.  After discharge, you should have regular check-up appointments with your healthcare provider that is prescribing your Eliquis.  In the future your dose may need to be changed if your kidney function or weight changes by a significant amount or as you get older.  What do you do if you miss a dose? If you miss a dose, take it as soon as you remember on the same day and resume taking twice daily.  Do not take more than one dose of ELIQUIS at the same time to make up a missed dose.  Important Safety Information A possible side effect of Eliquis is bleeding. You should call your healthcare provider right away if you experience any of the following: ? Bleeding from an injury or your nose that does not stop. ? Unusual colored urine (red or dark brown) or unusual colored stools (red or black). ? Unusual bruising for unknown reasons. ? A serious fall or if you hit your head (even if there is no bleeding).  Some medicines may interact with Eliquis and might increase your risk of bleeding or  clotting while on Eliquis. To help avoid this, consult your healthcare provider or pharmacist prior to using any new prescription or non-prescription medications, including herbals, vitamins, non-steroidal anti-inflammatory drugs (NSAIDs) and supplements.  This website has more information on Eliquis (apixaban): http://www.eliquis.com/eliquis/home    1. Please wash your incisions with soap and water, pat dry with a clean towel. You may shower but no baths or submerging the incision in water. 2. No driving until cleared by our office. This usually occurs during the follow-up visit. 3. Please do not lift anything heavier than 5 lbs with your upper extremity. It is encouraged to walk several times a day. Avoid movements such as pushing, pulling, or dragging objects from behind. This will only put pressure on your sternal incision.  4. If there is any drainage from your incision that is white or milky please call our office right away for an incision check. This may be a sign of infection. 5. If there are any other questions please call our office at any time: [phone]. Thank you.

## 2017-09-12 NOTE — Plan of Care (Signed)
  Progressing Health Behavior/Discharge Planning: Ability to manage health-related needs will improve 09/12/2017 1138 - Progressing by Karren BurlyEckelmann, Sophea Rackham E, RN Clinical Measurements: Ability to maintain clinical measurements within normal limits will improve 09/12/2017 1138 - Progressing by Hortense RamalEckelmann, Lya Holben E, RN Note HR remains elevated; managing with medications.  Cardiovascular complication will be avoided 09/12/2017 1138 - Progressing by Karren BurlyEckelmann, Sheza Strickland E, RN Activity: Risk for activity intolerance will decrease 09/12/2017 1138 - Progressing by Karren BurlyEckelmann, Kenadie Royce E, RN Note Has already ambulated 2000 feet today. Nutrition: Adequate nutrition will be maintained 09/12/2017 1138 - Progressing by Karren BurlyEckelmann, Cydne Grahn E, RN Coping: Level of anxiety will decrease 09/12/2017 1138 - Progressing by Karren BurlyEckelmann, Yesly Gerety E, RN Pain Managment: General experience of comfort will improve 09/12/2017 1138 - Progressing by Karren BurlyEckelmann, Robert Sperl E, RN Skin Integrity: Risk for impaired skin integrity will decrease 09/12/2017 1138 - Progressing by Karren BurlyEckelmann, Jasalyn Frysinger E, RN Education: Knowledge of disease or condition will improve 09/12/2017 1138 - Progressing by Karren BurlyEckelmann, Koleton Duchemin E, RN Cardiac: Hemodynamic stability will improve 09/12/2017 1138 - Progressing by Karren BurlyEckelmann, Dorean Hiebert E, RN Clinical Measurements: Postoperative complications will be avoided or minimized 09/12/2017 1138 - Progressing by Karren BurlyEckelmann, Cherissa Hook E, RN Skin Integrity: Wound healing without signs and symptoms of infection 09/12/2017 1138 - Progressing by Karren BurlyEckelmann, Charae Depaolis E, RN Urinary Elimination: Ability to achieve and maintain adequate renal perfusion and functioning will improve 09/12/2017 1138 - Progressing by Karren BurlyEckelmann, Cherica Heiden E, RN

## 2017-09-12 NOTE — Care Management Note (Signed)
Case Management Note Donn PieriniKristi Velna Hedgecock RN, BSN Unit 4E-Case Manager-- 2H coverage [phone]  Patient Details  Name: Wells GuilesFoster L Art MRN: [id number] Date of Birth: 07/21/1965  Subjective/Objective:  Pt admitted s/p repair of sinus of valsalva aneurysm                 Action/Plan: PTA pt lived at home, independent- anticipate return home- CM to follow for transition of care needs.   Expected Discharge Date:                  Expected Discharge Plan:  Home/Self Care  In-House Referral:     Discharge planning Services  CM Consult  Post Acute Care Choice:    Choice offered to:     DME Arranged:    DME Agency:     HH Arranged:    HH Agency:     Status of Service:  In process, will continue to follow  If discussed at Long Length of Stay Meetings, dates discussed:    Discharge Disposition:   Additional Comments:  Darrold SpanWebster, Edynn Gillock Hall, RN 09/12/2017, 10:43 AM

## 2017-09-12 NOTE — Progress Notes (Signed)
TCTS BRIEF SICU PROGRESS NOTE  4 Days Post-Op  S/P Procedure(s) (LRB): REPAIR OF SINUS OF VALSALVA ANEURYSM (N/A) TRANSESOPHAGEAL ECHOCARDIOGRAM (TEE) (N/A)   Stable day  Plan: Awaiting bed for transfer  Purcell Nailslarence H Fielding Mault, MD 09/12/2017 6:03 PM

## 2017-09-13 ENCOUNTER — Encounter (HOSPITAL_COMMUNITY): Payer: Self-pay | Admitting: Thoracic Surgery (Cardiothoracic Vascular Surgery)

## 2017-09-13 DIAGNOSIS — I10 Essential (primary) hypertension: Secondary | ICD-10-CM

## 2017-09-13 DIAGNOSIS — I4892 Unspecified atrial flutter: Secondary | ICD-10-CM

## 2017-09-13 LAB — BASIC METABOLIC PANEL
Anion gap: 9 (ref 5–15)
BUN: 15 mg/dL (ref 6–20)
CALCIUM: 8.1 mg/dL — AB (ref 8.9–10.3)
CHLORIDE: 100 mmol/L — AB (ref 101–111)
CO2: 26 mmol/L (ref 22–32)
CREATININE: 1.09 mg/dL (ref 0.61–1.24)
GFR calc non Af Amer: >60 mL/min (ref >60)
Glucose, Bld: 117 mg/dL — ABNORMAL HIGH (ref 65–99)
Potassium: 3.8 mmol/L (ref 3.5–5.1)
SODIUM: 135 mmol/L (ref 135–145)

## 2017-09-13 LAB — CBC
HEMATOCRIT: 30.8 % — AB (ref 39.0–52.0)
HEMOGLOBIN: 10.3 g/dL — AB (ref 13.0–17.0)
MCH: 27 pg (ref 26.0–34.0)
MCHC: 33.4 g/dL (ref 30.0–36.0)
MCV: 80.6 fL (ref 78.0–100.0)
Platelets: 217 10*3/uL (ref 150–400)
RBC: 3.82 MIL/uL — ABNORMAL LOW (ref 4.22–5.81)
RDW: 14.6 % (ref 11.5–15.5)
WBC: 5.7 10*3/uL (ref 4.0–10.5)

## 2017-09-13 LAB — ECHO TEE
Ao-asc: 3 cm
Mean grad: 1 mmHg
SINUS: 6.3 cm

## 2017-09-13 LAB — PROTIME-INR
INR: 1.13
PROTHROMBIN TIME: 14.4 s (ref 11.4–15.2)

## 2017-09-13 MED ORDER — METOPROLOL TARTRATE 50 MG PO TABS
50.0000 mg | ORAL_TABLET | Freq: Two times a day (BID) | ORAL | Status: DC
  Administered 2017-09-13 – 2017-09-14 (×4): 50 mg via ORAL
  Filled 2017-09-13 (×4): qty 1

## 2017-09-13 MED ORDER — APIXABAN 5 MG PO TABS
5.0000 mg | ORAL_TABLET | Freq: Two times a day (BID) | ORAL | Status: DC
  Administered 2017-09-13 – 2017-09-15 (×5): 5 mg via ORAL
  Filled 2017-09-13 (×5): qty 1

## 2017-09-13 NOTE — Progress Notes (Addendum)
      [address]       [place] [id number]             [phone]      5 Days Post-Op Procedure(s) (LRB): REPAIR OF SINUS OF VALSALVA ANEURYSM (N/A) TRANSESOPHAGEAL ECHOCARDIOGRAM (TEE) (N/A) Subjective: Feels okay this morning. No issues overnight.   Objective: Vital signs in last 24 hours: Temp:  [98.1 F (36.7 C)-98.8 F (37.1 C)] 98.6 F (37 C) (03/19 0526) Pulse Rate:  [67-122] 94 (03/19 0526) Cardiac Rhythm: Atrial flutter (03/19 0700) Resp:  [17-28] 17 (03/19 0526) BP: (101-137)/(61-91) 119/83 (03/19 0526) SpO2:  [95 %-100 %] 95 % (03/19 0526) FiO2 (%):  [0 %] 0 % (03/18 1948) Weight:  [294 lb 12.8 oz (133.7 kg)-296 lb 1.6 oz (134.3 kg)] 294 lb 12.8 oz (133.7 kg) (03/19 0526)     Intake/Output from previous day: 03/18 0701 - 03/19 0700 In: 1150 [P.O.:800; I.V.:100; IV Piggyback:250] Out: 1 [Stool:1] Intake/Output this shift: No intake/output data recorded.  General appearance: alert, cooperative and no distress Heart: sinus tachycardia Lungs: clear to auscultation bilaterally Abdomen: soft, non-tender; bowel sounds normal; no masses,  no organomegaly Extremities: extremities normal, atraumatic, no cyanosis or edema Wound: clean and dry  Lab Results: Recent Labs    09/12/17 0505 09/13/17 0250  WBC 6.8 5.7  HGB 10.8* 10.3*  HCT 31.8* 30.8*  PLT 178 217   BMET:  Recent Labs    09/12/17 0505 09/13/17 0250  NA 134* 135  K 3.4* 3.8  CL 100* 100*  CO2 25 26  GLUCOSE 121* 117*  BUN 15 15  CREATININE 1.05 1.09  CALCIUM 8.0* 8.1*    PT/INR:  Recent Labs    09/13/17 0250  LABPROT 14.4  INR 1.13   ABG    Component Value Date/Time   PHART 7.329 (L) 09/08/2017 2047   HCO3 19.2 (L) 09/08/2017 2047   TCO2 23 09/09/2017 1642   ACIDBASEDEF 6.0 (H) 09/08/2017 2047   O2SAT 99.0 09/08/2017 2047   CBG (last 3)  Recent Labs    09/11/17 2307 09/12/17 0357 09/12/17 0733  GLUCAP 105* 110* 110*    Assessment/Plan: S/P  Procedure(s) (LRB): REPAIR OF SINUS OF VALSALVA ANEURYSM (N/A) TRANSESOPHAGEAL ECHOCARDIOGRAM (TEE) (N/A)  1. CV-In ST rate 120s. Oral Amio and Metoprolol. BP well controlled.  Increase Metoprolol to 50mg  BID. Keep wires one more day. Coumadin started, INR 1.13 today. Continue 5mg  Coumadin daily.  2. Pulm-on room air with excellent oxygen saturation 3. Renal-creatinine 1.09, electrolytes well controlled 4. H and H is stable.  5. Platelets trending up  Plan: Increase metoprolol for better HR control. Ambulate around the unit today. Encourage use of incentive spirometer.    LOS: 5 days    Matthew Hoffman 09/13/2017 Patient seen and examined, agree with above Although telemetry says sinu tachy, I think he is in atrial flutter. Agree with increasing metoprolol. Will ask Cardiology to see if they have any other ideas. Coumadin started  United Technologies CorporationSteven C. Dorris FetchHendrickson, MD [facility] [phone]

## 2017-09-13 NOTE — Progress Notes (Signed)
Patient ambulated in hallway with nursing staff. Corynn Solberg Jessup RN  

## 2017-09-13 NOTE — Consult Note (Addendum)
Cardiology Consultation:   Patient ID: Matthew Hoffman; [id number]; 1966-04-14   Admit date: 09/08/2017 Date of Consult: 09/13/2017  Primary Care Provider: Ralene Ok, MD Primary Cardiologist: Olga Millers, MD  Primary Electrophysiologist:     Patient Profile:   Matthew Hoffman is a 52 y.o. male with a hx of HTN and recent repair of thoracic aortic aneurysm who is being seen today for the evaluation of Afib RVR at the request of Dr. Dorris Fetch.  History of Present Illness:   Matthew Hoffman is known to this service and last saw Dr. Jens Som on 08/18/17 for preoperative evaluation prior to repair of ascending aorta aneurysm. CT abdomen for hematuria with incidental finding of thoracic aortic aneurysm. 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 Pt was seen in ER twice in Feb with chest pain. CE negative. EKG with nonspecific ST changes.  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  He ws cleared for surgery with TCTS and underwent aneurysm repair on 09/08/17. There were no surgical complications; however, on POD #2 (09/10/17), he was noted to be in atrial fibrillation/flutter. Amiodarone IV started and he converted to Afib. He continued to have Afib/flutter with bouts of NSR. Lopressor was started at 12.5 mg and titrated up to 50 mg BID. Cardiology was asked to evaluate for management of his refractory Afib/flutter. Warfarin was started 09/12/17 and INR today was 1.13.  On my interview, the patient is tolerating this rate and rhythm well. He is consistently in the 120s in what appears to be atrial flutter. He is currently better rate-controlled in the 80s. He denies palpitations, dizziness, and feelings of pre-syncope.    Past Medical History:  Diagnosis Date  . Arthritis   . Diverticulosis   . Hiatal hernia    small per ct 2016  . Hypertension   . Hypertrophy of  prostate    mild  . OSA on CPAP    per pt moderate osa per study  . Sinus of Valsalva aneurysm   . Wears glasses     Past Surgical History:  Procedure Laterality Date  . COLON RESECTION    . ELBOW SURGERY Right 1999  . KNEE ARTHROSCOPY Left 1995  . RIGHT/LEFT HEART CATH AND CORONARY ANGIOGRAPHY N/A 08/24/2017   Procedure: RIGHT/LEFT HEART CATH AND CORONARY ANGIOGRAPHY;  Surgeon: Kathleene Hazel, MD;  Location: [facility] INVASIVE CV [facility];  Service: Cardiovascular;  Laterality: N/A;  . SHOULDER SURGERY Bilateral right 1992/  left 1987  . TEE WITHOUT CARDIOVERSION N/A 09/08/2017   Procedure: TRANSESOPHAGEAL ECHOCARDIOGRAM (TEE);  Surgeon: Loreli Slot, MD;  Location: [facility] OR;  Service: Open Heart Surgery;  Laterality: N/A;  . THORACIC AORTIC ANEURYSM REPAIR N/A 09/08/2017   Procedure: REPAIR OF SINUS OF VALSALVA ANEURYSM;  Surgeon: Loreli Slot, MD;  Location: [facility] OR;  Service: Open Heart Surgery;  Laterality: N/A;  Using 30mm Valsalva Gelweave Graft  . UMBILICAL HERNIA REPAIR  10/2015  . VASECTOMY Bilateral 06/10/2016   Procedure: VASECTOMY;  Surgeon: Malen Gauze, MD;  Location: [facility];  Service: Urology;  Laterality: Bilateral;     Home Medications:  Prior to Admission medications   Medication Sig Start Date End Date Taking? Authorizing Provider  aspirin EC 81 MG tablet Take 81 mg by mouth daily.   Yes [provider]  guaiFENesin (MUCINEX) 600 MG 12 hr tablet Take 600 mg  by mouth 2 (two) times daily as needed for cough or to loosen phlegm.    Yes [provider]  Multiple Vitamin (MULTIVITAMIN) tablet Take 1 tablet by mouth daily.   Yes [provider]  tamsulosin (FLOMAX) 0.4 MG CAPS capsule Take 0.4 mg by mouth at bedtime. 08/08/17  Yes [provider]  valsartan-hydrochlorothiazide (DIOVAN-HCT) 160-12.5 MG tablet Take 1 tablet by mouth every morning.   Yes [provider]  EPINEPHrine (EPIPEN 2-PAK)  0.3 mg/0.3 mL IJ SOAJ injection Inject 0.3 mg into the muscle as needed (for allergic reaction).     [provider]    Inpatient Medications: Scheduled Meds: . acetaminophen  1,000 mg Oral Q6H   Or  . acetaminophen (TYLENOL) oral liquid 160 mg/5 mL  1,000 mg Per Tube Q6H  . amiodarone  400 mg Oral Q12H   Followed by  . [START ON 09/18/2017] amiodarone  400 mg Oral Daily  . aspirin EC  81 mg Oral Daily  . bisacodyl  10 mg Oral Daily   Or  . bisacodyl  10 mg Rectal Daily  . docusate sodium  200 mg Oral Daily  . enoxaparin (LOVENOX) injection  40 mg Subcutaneous QHS  . metoprolol tartrate  50 mg Oral BID  . pantoprazole  40 mg Oral Daily  . sodium chloride flush  3 mL Intravenous Q12H  . tamsulosin  0.4 mg Oral QHS  . warfarin  5 mg Oral q1800  . Warfarin - Physician Dosing Inpatient   Does not apply q1800   Continuous Infusions: . sodium chloride     PRN Meds: sodium chloride, alum & mag hydroxide-simeth, lactulose, magnesium hydroxide, ondansetron (ZOFRAN) IV, oxyCODONE, sodium chloride flush, traMADol, zolpidem  Allergies:    Allergies  Allergen Reactions  . Shellfish Allergy Hives and Swelling    SWELLING REACTION UNSPECIFIED     Social History:   Social History   Socioeconomic History  . Marital status: Divorced    Spouse name: Not on file  . Number of children: 2  . Years of education: Not on file  . Highest education level: Not on file  Social Needs  . Financial resource strain: Not on file  . Food insecurity - worry: Not on file  . Food insecurity - inability: Not on file  . Transportation needs - medical: Not on file  . Transportation needs - non-medical: Not on file  Occupational History    Comment: Counseling  Tobacco Use  . Smoking status: Former Smoker    Years: 0.00    Types: Cigars  . Smokeless tobacco: Never Used  . Tobacco comment: average cigar 2 per month  Substance and Sexual Activity  . Alcohol use: Yes    Comment: OCCASIONAL    . Drug use: No  . Sexual activity: Not on file  Other Topics Concern  . Not on file  Social History Narrative  . Not on file    Family History:    Family History  Problem Relation Age of Onset  . Hypertension Mother   . COPD Father      ROS:  Please see the history of present illness.   All other ROS reviewed and negative.     Physical Exam/Data:   Vitals:   09/12/17 2212 09/13/17 0526 09/13/17 0900 09/13/17 0910  BP: 121/87 119/83  (!) 130/95  Pulse: 91 94 (!) 124   Resp: 17 17  19   Temp: 98.8 F (37.1 C) 98.6 F (37 C)  TempSrc: Oral Oral    SpO2: 96% 95%  97%  Weight: 296 lb 1.6 oz (134.3 kg) 294 lb 12.8 oz (133.7 kg)    Height:        Intake/Output Summary (Last 24 hours) at 09/13/2017 1018 Last data filed at 09/13/2017 0540 Gross per 24 hour  Intake 950 ml  Output 1 ml  Net 949 ml   Filed Weights   09/12/17 0500 09/12/17 2212 09/13/17 0526  Weight: (!) 318 lb 5.5 oz (144.4 kg) 296 lb 1.6 oz (134.3 kg) 294 lb 12.8 oz (133.7 kg)   Body mass index is 37.85 kg/m.  General:  Well nourished, well developed, in no acute distress HEENT: normal Neck: no JVD Vascular: No carotid bruits  Cardiac:  Irregular rhythm and irregular rate,  No murmur, sternotomy C/D/I Lungs:  clear to auscultation bilaterally, no wheezing, rhonchi or rales  Abd: soft, nontender, no hepatomegaly  Ext: no edema Musculoskeletal:  No deformities, BUE and BLE strength normal and equal Skin: warm and dry  Neuro:  CNs 2-12 intact, no focal abnormalities noted Psych:  Normal affect   EKG:  The EKG was personally reviewed and demonstrates:  Atrial flutter Telemetry:  Telemetry was personally reviewed and demonstrates:  Aflutter 80-120s  Relevant CV Studies:  none  Laboratory Data:  Chemistry Recent Labs  Lab 09/11/17 0533 09/12/17 0505 09/13/17 0250  NA 135 134* 135  K 3.7 3.4* 3.8  CL 101 100* 100*  CO2 25 25 26   GLUCOSE 121* 121* 117*  BUN 15 15 15   CREATININE 1.06  1.05 1.09  CALCIUM 8.1* 8.0* 8.1*  GFRNONAA >60 >60 >60  GFRAA >60 >60 >60  ANIONGAP 9 9 9     No results for input(s): PROT, ALBUMIN, AST, ALT, ALKPHOS, BILITOT in the last 168 hours. Hematology Recent Labs  Lab 09/10/17 0442 09/12/17 0505 09/13/17 0250  WBC 9.9 6.8 5.7  RBC 4.34 3.99* 3.82*  HGB 11.8* 10.8* 10.3*  HCT 35.4* 31.8* 30.8*  MCV 81.6 79.7 80.6  MCH 27.2 27.1 27.0  MCHC 33.3 34.0 33.4  RDW 14.2 14.0 14.6  PLT 126* 178 217   Cardiac EnzymesNo results for input(s): TROPONINI in the last 168 hours. No results for input(s): TROPIPOC in the last 168 hours.  BNPNo results for input(s): BNP, PROBNP in the last 168 hours.  DDimer No results for input(s): DDIMER in the last 168 hours.  Radiology/Studies:  Dg Chest Port 1 View  Result Date: 09/12/2017 CLINICAL DATA:  Status post thoracic aortic aneurysm repair 3 days ago. EXAM: PORTABLE CHEST 1 VIEW COMPARISON:  Chest x-ray of September 10, 2017 FINDINGS: The lungs are adequately inflated. There is patchy increased density adjacent to the lower left heart border and in the left retrocardiac region. The left lateral costophrenic angle is minimally blunted. The right lung is grossly clear. The heart is enlarged. The pulmonary vascularity is not engorged. The sternal wires are intact. The right internal jugular Cordis sheath tip projects over the junction of the proximal and middle thirds of the SVC. IMPRESSION: Left basilar atelectasis or pneumonia slightly more conspicuous today. Trace left pleural effusion, stable. No pulmonary edema. Electronically Signed   By: David  [place] M.D.   On: 09/12/2017 08:37   Dg Chest Port 1 View  Result Date: 09/10/2017 CLINICAL DATA:  Ascending aortic aneurysm EXAM: PORTABLE CHEST 1 VIEW COMPARISON:  09/09/2017 FINDINGS: Sternotomy wires overlie normal cardiac silhouette. Central venous line unchanged. Low lung volumes. No pulmonary edema. Mild LEFT basilar  atelectasis. IMPRESSION: Increase in LEFT  basilar atelectasis. Electronically Signed   By: Genevive BiStewart  Edmunds M.D.   On: 09/10/2017 07:20   Dg Chest Port 1 View  Result Date: 09/09/2017 CLINICAL DATA:  Chest tube removal. Recent thoracic aortic aneurysm repair. EXAM: PORTABLE CHEST 1 VIEW 12:44 p.m. COMPARISON:  09/09/2017 at 5:53 a.m. and 09/08/2017 FINDINGS: Chest tube and Swan-Ganz catheter has been removed. Sheath remains in the superior vena cava. No pneumothorax. Heart size and vascularity are normal. Lungs are clear. No effusions. IMPRESSION: No acute abnormality. Specifically, no pneumothorax after chest tube removal. Electronically Signed   By: Francene BoyersJames  Maxwell M.D.   On: 09/09/2017 12:57    Assessment and Plan:   1. Atrial flutter with RVR Pt has been in atrial flutter since 09/10/17 with rates primarily in the 120s. He was started on IV amiodarone and PO lopressor has been titrated to 50 mg BID. He remains poorly rate controlled. Amiodarone has transitioned to PO dosing at 400 mg BID. Agree with this dosing regimen. In consultation with TCTS, will transition coumadin to eliquis. If he remains poorly rate controlled, with plan for TEE/DCCV Thursday 09/15/17.  Alternatively, If he is rate-controlled and still tolerating the rhythm well, may consider outpatient DCCV after three weeks of anticoagulation.  This patients CHA2DS2-VASc Score and unadjusted Ischemic Stroke Rate (% per year) is equal to 0.6 % stroke rate/year from a score of 1 (HTN)   2. S/P repair of sinus of valsalva He is doing well post-op, aside from flutter. Encourage ambulation.   3. HTN Home valsartan-HCTZ on hold while titrating medications for rate control. Pressures have been well-controlled on the current regimen.   For questions or updates, please contact [facility] Please consult [email or web address] for contact info under Cardiology/STEMI.   Signed, Roe Rutherfordngela Nicole Duke, PA  09/13/2017 10:18 AM  As above, patient seen and examined.  Briefly he is a  52 year old male status post recent repair of sinus of Valsalva aneurysm with resuspension of the aortic valve for evaluation of postoperative atrial flutter.  Patient denies dyspnea, chest pain, palpitations or syncope.  He has been in atrial flutter since March 16.  He is presently on amiodarone and his metoprolol was increased today.  1 postoperative atrial flutter-patient has been in atrial flutter for greater than 48 hours. CHADS vasc 1.  Discontinue Coumadin.  Instead we will treat with apixaban 5 mg twice daily.  Continue amiodarone load.  I agree with increasing metoprolol to 50 mg twice daily.  We will follow heart rate.  If we can control his heart rate with above measures then we will plan to discharge on rate control and anticoagulation.  If he does not convert on his own we could proceed with cardioversion 3 weeks after fully anticoagulated.  If his rate is difficult to control we will plan to proceed with TEE guided cardioversion on Thursday.  I do not think he will require long-term anticoagulation as this appears to be postoperative atrial arrhythmia.    2 status post sinus of Valsalva aneurysm repair-Per CVTS.  3 hypertension-blood pressure is controlled.  Continue present medications.  Olga MillersBrian Crenshaw, MD

## 2017-09-13 NOTE — Progress Notes (Signed)
Pt is scheduled for TEE/DCCV on Thursday at noon with Dr. Eden EmmsNishan. NPO at MN Wed night.  Matthew Rutherfordngela Nicole Damyah Gugel, PA-C 09/13/2017, 11:27 AM [phone]

## 2017-09-13 NOTE — Progress Notes (Signed)
1412 Came to see pt to walk. Pt is sleeping soundly. Talked with RN. Pt has walked twice today (470 ft ) once. Will follow up tomorrow. Luetta NuttingCharlene Mishael Haran RN BSN 09/13/2017 2:13 PM

## 2017-09-14 ENCOUNTER — Inpatient Hospital Stay (HOSPITAL_COMMUNITY): Payer: BLUE CROSS/BLUE SHIELD

## 2017-09-14 NOTE — Progress Notes (Signed)
CARDIAC REHAB PHASE I   PRE:  Rate/Rhythm: 123 aflutter  BP:  Supine:   Sitting: 125/99  Standing:    SaO2: 98%RA  MODE:  Ambulation: 470 ft   POST:  Rate/Rhythm: 124 aflutter  BP:  Supine:   Sitting: 111/99  Standing:    SaO2: 99%RA 1025-1052 Pt walked 470 ft on RA with steady gait. Tolerated well even with fast heart rate. To bed after walk.    Luetta Nuttingharlene Nataliyah Packham, RN BSN  09/14/2017 10:48 AM

## 2017-09-14 NOTE — Progress Notes (Signed)
    [facility] has been requested to perform a transesophageal echocardiogram on Matthew Hoffman for atrial fibrillation.  After careful review of history and examination, the risks and benefits of transesophageal echocardiogram have been explained including risks of esophageal damage, perforation (1:10,000 risk), bleeding, pharyngeal hematoma as well as other potential complications associated with conscious sedation including aspiration, arrhythmia, respiratory failure and death. Alternatives to treatment were discussed, questions were answered. Patient is willing to proceed.   Pt is scheduled for TEE/DCCV tomorrow 09/15/17 at 1300 with Dr. Rennis GoldenHilty. NPO at midnight please.  Roe Rutherfordngela Nicole Judyth Demarais, GeorgiaPA  09/14/2017 11:11 AM

## 2017-09-14 NOTE — Progress Notes (Addendum)
      [address]       [place] [id number]             [phone]      6 Days Post-Op Procedure(s) (LRB): REPAIR OF SINUS OF VALSALVA ANEURYSM (N/A) TRANSESOPHAGEAL ECHOCARDIOGRAM (TEE) (N/A) Subjective: Feels okay this morning. He did feel some fluttering in his chest earlier.   Objective: Vital signs in last 24 hours: Temp:  [98.5 F (36.9 C)-100.2 F (37.9 C)] 98.5 F (36.9 C) (03/20 0321) Pulse Rate:  [70-124] 117 (03/19 1958) Cardiac Rhythm: Atrial flutter (03/20 0700) Resp:  [19-25] 20 (03/20 0321) BP: (97-143)/(59-96) 143/96 (03/20 0321) SpO2:  [95 %-99 %] 99 % (03/20 0321) Weight:  [294 lb 11.2 oz (133.7 kg)] 294 lb 11.2 oz (133.7 kg) (03/20 0321)     Intake/Output from previous day: 03/19 0701 - 03/20 0700 In: 1920 [P.O.:1920] Out: -  Intake/Output this shift: No intake/output data recorded.  General appearance: alert, cooperative and no distress Heart: irregularly irregular Lungs: clear to auscultation bilaterally Abdomen: soft, non-tender; bowel sounds normal; no masses,  no organomegaly Extremities: extremities normal, atraumatic, no cyanosis or edema Wound: clean and dry  Lab Results: Recent Labs    09/12/17 0505 09/13/17 0250  WBC 6.8 5.7  HGB 10.8* 10.3*  HCT 31.8* 30.8*  PLT 178 217   BMET:  Recent Labs    09/12/17 0505 09/13/17 0250  NA 134* 135  K 3.4* 3.8  CL 100* 100*  CO2 25 26  GLUCOSE 121* 117*  BUN 15 15  CREATININE 1.05 1.09  CALCIUM 8.0* 8.1*    PT/INR:  Recent Labs    09/13/17 0250  LABPROT 14.4  INR 1.13   ABG    Component Value Date/Time   PHART 7.329 (L) 09/08/2017 2047   HCO3 19.2 (L) 09/08/2017 2047   TCO2 23 09/09/2017 1642   ACIDBASEDEF 6.0 (H) 09/08/2017 2047   O2SAT 99.0 09/08/2017 2047   CBG (last 3)  Recent Labs    09/11/17 2307 09/12/17 0357 09/12/17 0733  GLUCAP 105* 110* 110*    Assessment/Plan: S/P Procedure(s) (LRB): REPAIR OF SINUS OF VALSALVA ANEURYSM  (N/A) TRANSESOPHAGEAL ECHOCARDIOGRAM (TEE) (N/A)  1. CV-aflutter rate 120s. Oral Amio 400mg  BID and Metoprolol 50mg  BID. BP well controlled. Keep wires. Coumadin discontinued and Eliquis initiated. Possible cardioversion tomorrow per cards if rate is still difficult to control.  2. Pulm-on room air with excellent oxygen saturation. continue Incentive spirometer.  3. Renal-creatinine 1.09, electrolytes well controlled 4. H and H is stable.  5. Platelets trending up  Plan: He is overall doing well. Possible cardioversion tomorrow per Cardiology.    LOS: 6 days    Sharlene Doryessa N Conte 09/14/2017 Patient seen and examined, agree with above He feels some popping in the chest. Sternum feels stable on exam- likely due to movement of costal cartilages  Viviann SpareSteven C. Dorris FetchHendrickson, MD [facility] [phone]

## 2017-09-14 NOTE — Discharge Summary (Addendum)
Physician Discharge Summary  Patient ID: Matthew Hoffman MRN: [id number] DOB/AGE: 1965-07-15 52 y.o.  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 Dorris Fetch.  He tolerated the procedure well and was transferred to the surgical ICU for continued care.  He was extubated in a timely manner.  Postop day 1 he was in atrial flutter rapid atrially paced now in and out of sinus rhythm and atrial fibrillation.  He was re-bolused with his amiodarone.  We initiated metoprolol.  He had some mild acute blood loss anemia.  His renal function remains stable.  He remained in atrial flutter with a rate in the 120s.  We increased his metoprolol to 50 mg twice daily.  We consulted cardiology for assistance.  Cardiology changed his Coumadin to Eliquis and plans for cardioversion on March 21st.  Post cardioversion the patient remained stable and in normal sinus rhythm.  His pacing wires were removed.  He is tolerating room air, his incisions are healing well, he is ambulating with limited assistance and he is ready for discharge home.   Consults: cardiology  Significant Diagnostic Studies:  CLINICAL DATA:  Status post thoracic aortic aneurysm repair 3 days ago.  EXAM: PORTABLE CHEST 1 VIEW  COMPARISON:  Chest x-ray of September 10, 2017  FINDINGS: The lungs are adequately inflated. There is patchy increased density adjacent to the lower left heart border and in the left retrocardiac region. The left lateral costophrenic angle is minimally blunted. The right lung is grossly clear. The  heart is enlarged. The pulmonary vascularity is not engorged. The sternal wires are intact. The right internal jugular Cordis sheath tip projects over the junction of the proximal and middle thirds of the SVC.  IMPRESSION: Left basilar atelectasis or pneumonia slightly more conspicuous today. Trace left pleural effusion, stable. No pulmonary edema.   Electronically Signed   By: David  [place] M.D.   On: 09/12/2017 08:37   Treatments:   NAME:  KAELIN, BONELLI              ACCOUNT NO.:  [id number]  MEDICAL RECORD NO.:  [id number]  LOCATION:  [facility]                         FACILITY:  [facility]  PHYSICIAN:  Salvatore Decent. Dorris Fetch, M.D.DATE OF BIRTH:  [birth year]  DATE OF PROCEDURE:  09/08/2017 DATE OF DISCHARGE:                              OPERATIVE REPORT   PREOPERATIVE DIAGNOSIS:  Sinus of Valsalva aneurysm.  POSTOPERATIVE DIAGNOSIS:  Sinus of Valsalva aneurysm.  PROCEDURE:  Repair of sinus of Valsalva aneurysm with reimplantation of aortic valve using a 30-mm Valsalva Gelweave graft (serial #[id number]).  SURGEON:  Salvatore Decent. Dorris Fetch, M.D.  ASSISTANT:  Jari Favre.  ANESTHESIA:  General.  FINDINGS:  Large sinus of Valsalva aneurysm.  Normal size aorta at sinotubular junction and distally.  Preserved left ventricular function with no significant aortic insufficiency pre or post bypass.     Discharge  Exam: Blood pressure (!) 132/94, pulse 64, temperature 98.3 F (36.8 C), temperature source Oral, resp. rate 17, height 6\' 2"  (1.88 m), weight 294 lb 8.6 oz (133.6 kg), SpO2 98 %.    General appearance: alert, cooperative and no distress Heart: regular rate and rhythm, S1, S2 normal, no murmur, click, rub or gallop Lungs: clear to auscultation bilaterally Abdomen: soft, non-tender; bowel sounds normal; no masses,  no organomegaly Extremities: extremities normal, atraumatic, no cyanosis or edema Wound: clean and dry    Disposition:   Discharge  Instructions    Amb Referral to Cardiac Rehabilitation   Complete by:  As directed    Repair of sinus of valsalva aneurysm and reimplantation of AV graft   Diagnosis:  Other Comment - repair of sinus of valsava aneursym     Allergies as of 09/16/2017      Reactions   Shellfish Allergy Hives, Swelling   SWELLING REACTION UNSPECIFIED    Oxycodone Other (See Comments)   Having severe hallucinations      Medication List    STOP taking these medications   valsartan-hydrochlorothiazide 160-12.5 MG tablet Commonly known as:  DIOVAN-HCT     TAKE these medications   acetaminophen 325 MG tablet Commonly known as:  TYLENOL Take 2 tablets (650 mg total) by mouth every 6 (six) hours as needed for mild pain or moderate pain.   amiodarone 200 MG tablet Commonly known as:  PACERONE Please take 400mg  (2 tabs) twice a day for 2 days then take 200mg  (1 tab) twice a day until we see you in follow-up.   apixaban 5 MG Tabs tablet Commonly known as:  ELIQUIS Take 1 tablet (5 mg total) by mouth 2 (two) times daily.   aspirin EC 81 MG tablet Take 81 mg by mouth daily.   EPIPEN 2-PAK 0.3 mg/0.3 mL Soaj injection Generic drug:  EPINEPHrine Inject 0.3 mg into the muscle as needed (for allergic reaction).   guaiFENesin 600 MG 12 hr tablet Commonly known as:  MUCINEX Take 600 mg by mouth 2 (two) times daily as needed for cough or to loosen phlegm.   lisinopril 10 MG tablet Commonly known as:  PRINIVIL,ZESTRIL Take 1 tablet (10 mg total) by mouth daily. Start taking on:  09/17/2017   metoprolol tartrate 50 MG tablet Commonly known as:  LOPRESSOR Take 1 tablet (50 mg total) by mouth 2 (two) times daily.   multivitamin tablet Take 1 tablet by mouth daily.   tamsulosin 0.4 MG Caps capsule Commonly known as:  FLOMAX Take 0.4 mg by mouth at bedtime.   traMADol 50 MG tablet Commonly known as:  ULTRAM Take 1 tablet (50 mg total) by mouth every 6 (six) hours as needed for moderate pain.       Follow-up Information    Ralene Ok, MD. Call in 1 day(s).   Specialty:  Internal Medicine Contact information: [address] [place] [address] [phone]        Loreli Slot, MD Follow up.   Specialty:  Cardiothoracic Surgery Why:  Your appointment is on 10/11/2017 at 12:30pm. Please arrive at 12:00pm for a chest xray located at [facility] which is on the first floor of our building.  Contact information: [address] [place] [address] [phone]        Jodelle Gross, NP Follow up.   Specialties:  Nurse Practitioner, Radiology, Cardiology Why:  [facility] location - 10/04/17 at 8am. Arrive 15 minutes prior  to appointment. Samara DeistKathryn is one of the nurse practitioners that works closely with Dr. Jens Somrenshaw. Contact information: [address] [place] [address] [phone]          The patient has been discharged on:   1.Beta Blocker:  Yes [ x  ]                              No   [   ]                              If No, reason:  2.Ace Inhibitor/ARB: Yes [  x ]                                     No  [  ]                                     If No, reason: titration of BB 3.Statin:   Yes [ x  ]                  No  [   ]                  If No, reason:  4.Ecasa:  Yes  [ x  ]                  No   [   ]                  If No, reason:   Signed: Sharlene Doryessa N Conte 09/16/2017, 2:14 PM

## 2017-09-14 NOTE — Progress Notes (Signed)
Progress Note  Patient Name: Matthew Hoffman Date of Encounter: 09/14/2017  Primary Cardiologist: Olga Millers, MD   Subjective   No dyspnea; chest "sore"  Inpatient Medications    Scheduled Meds: . amiodarone  400 mg Oral Q12H   Followed by  . [START ON 09/18/2017] amiodarone  400 mg Oral Daily  . apixaban  5 mg Oral BID  . aspirin EC  81 mg Oral Daily  . bisacodyl  10 mg Oral Daily   Or  . bisacodyl  10 mg Rectal Daily  . docusate sodium  200 mg Oral Daily  . metoprolol tartrate  50 mg Oral BID  . pantoprazole  40 mg Oral Daily  . sodium chloride flush  3 mL Intravenous Q12H  . tamsulosin  0.4 mg Oral QHS   Continuous Infusions: . sodium chloride     PRN Meds: sodium chloride, alum & mag hydroxide-simeth, lactulose, magnesium hydroxide, ondansetron (ZOFRAN) IV, oxyCODONE, sodium chloride flush, traMADol, zolpidem   Vital Signs    Vitals:   09/13/17 0910 09/13/17 1323 09/13/17 1958 09/14/17 0321  BP: (!) 130/95 110/64 112/90 (!) 143/96  Pulse:  70 (!) 117   Resp: 19 (!) 24 19 20   Temp:  100.2 F (37.9 C) 99.4 F (37.4 C) 98.5 F (36.9 C)  TempSrc:  Oral Oral Oral  SpO2: 97% 96% 95% 99%  Weight:    294 lb 11.2 oz (133.7 kg)  Height:        Intake/Output Summary (Last 24 hours) at 09/14/2017 1042 Last data filed at 09/14/2017 0300 Gross per 24 hour  Intake 1560 ml  Output -  Net 1560 ml   Filed Weights   09/12/17 2212 09/13/17 0526 09/14/17 0321  Weight: 296 lb 1.6 oz (134.3 kg) 294 lb 12.8 oz (133.7 kg) 294 lb 11.2 oz (133.7 kg)    Telemetry    Atrial flutter with elevated rate- Personally Reviewed   Physical Exam   GEN: No acute distress.   Neck: No JVD Cardiac: tachycardic Respiratory: Clear to auscultation bilaterally; s/p sternotomy GI: Soft, nontender, non-distended  MS: No edema Neuro:  Nonfocal  Psych: Normal affect   Labs    Chemistry Recent Labs  Lab 09/11/17 0533 09/12/17 0505 09/13/17 0250  NA 135 134* 135  K 3.7  3.4* 3.8  CL 101 100* 100*  CO2 25 25 26   GLUCOSE 121* 121* 117*  BUN 15 15 15   CREATININE 1.06 1.05 1.09  CALCIUM 8.1* 8.0* 8.1*  GFRNONAA >60 >60 >60  GFRAA >60 >60 >60  ANIONGAP 9 9 9      Hematology Recent Labs  Lab 09/10/17 0442 09/12/17 0505 09/13/17 0250  WBC 9.9 6.8 5.7  RBC 4.34 3.99* 3.82*  HGB 11.8* 10.8* 10.3*  HCT 35.4* 31.8* 30.8*  MCV 81.6 79.7 80.6  MCH 27.2 27.1 27.0  MCHC 33.3 34.0 33.4  RDW 14.2 14.0 14.6  PLT 126* 178 217     Radiology    Dg Chest 2 View  Result Date: 09/14/2017 CLINICAL DATA:  Status post sinus of Valsalva aneurysm repair. EXAM: CHEST - 2 VIEW COMPARISON:  September 12, 2017 FINDINGS: There is atelectatic change in the left lower lobe. Lungs elsewhere clear. Heart is mildly enlarged with pulmonary venous hypertension. Patient is status post median sternotomy. No adenopathy. There is postoperative change in the left shoulder. Temporary pacemaker wires are attached to the right heart. IMPRESSION: Pulmonary vascular congestion. Left lower lobe atelectatic change. Lungs elsewhere clear. Postoperative change in  the mediastinum and left shoulder. Electronically Signed   By: Bretta BangWilliam  Woodruff III M.D.   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      Assessment & Plan    1 postoperative atrial flutter-patient remains in atrial flutter today and rate continues to be elevated.  Continue apixaban.  Continue amiodarone and metoprolol.  Proceed with TEE guided cardioversion tomorrow.  I do not think he will require long-term anticoagulation as this appears to be postoperative atrial arrhythmia.    2 status post sinus of Valsalva aneurysm repair-Per CVTS.  3 hypertension-blood pressure is controlled on present medications.  For questions or updates, please contact [facility] Please  consult [email or web address] for contact info under Cardiology/STEMI.      Signed, Olga MillersBrian Crenshaw, MD  09/14/2017, 10:42 AM

## 2017-09-14 NOTE — Progress Notes (Signed)
Patient ambulated in the hall 94240ft.

## 2017-09-15 ENCOUNTER — Ambulatory Visit (HOSPITAL_COMMUNITY): Payer: BLUE CROSS/BLUE SHIELD

## 2017-09-15 ENCOUNTER — Encounter (HOSPITAL_COMMUNITY)
Admission: RE | Disposition: A | Discharge status: Home / Self Care | Payer: Self-pay | Source: Ambulatory Visit | Attending: Thoracic Surgery (Cardiothoracic Vascular Surgery)

## 2017-09-15 ENCOUNTER — Encounter: Payer: Self-pay | Admitting: Physician Assistant

## 2017-09-15 ENCOUNTER — Inpatient Hospital Stay (HOSPITAL_COMMUNITY): Payer: BLUE CROSS/BLUE SHIELD | Admitting: Certified Registered Nurse Anesthetist

## 2017-09-15 ENCOUNTER — Encounter (HOSPITAL_COMMUNITY): Payer: Self-pay | Admitting: *Deleted

## 2017-09-15 DIAGNOSIS — I484 Atypical atrial flutter: Secondary | ICD-10-CM

## 2017-09-15 DIAGNOSIS — I4892 Unspecified atrial flutter: Secondary | ICD-10-CM

## 2017-09-15 DIAGNOSIS — I483 Typical atrial flutter: Secondary | ICD-10-CM

## 2017-09-15 HISTORY — PX: CARDIOVERSION: SHX1299

## 2017-09-15 HISTORY — PX: TEE WITHOUT CARDIOVERSION: SHX5443

## 2017-09-15 SURGERY — ECHOCARDIOGRAM, TRANSESOPHAGEAL
Anesthesia: Monitor Anesthesia Care

## 2017-09-15 MED ORDER — MIDAZOLAM HCL 2 MG/2ML IJ SOLN: 0.5000 mg | Freq: Once | INTRAMUSCULAR | Status: DC | PRN

## 2017-09-15 MED ORDER — SODIUM CHLORIDE 0.9% FLUSH: 3.0000 mL | INTRAVENOUS | Status: DC | PRN

## 2017-09-15 MED ORDER — LIDOCAINE 2% (20 MG/ML) 5 ML SYRINGE
INTRAMUSCULAR | Status: DC | PRN
  Administered 2017-09-15: 100 mg via INTRAVENOUS

## 2017-09-15 MED ORDER — MEPERIDINE HCL 100 MG/ML IJ SOLN: 6.2500 mg | INTRAMUSCULAR | Status: DC | PRN

## 2017-09-15 MED ORDER — TRAMADOL HCL 50 MG PO TABS
50.0000 mg | ORAL_TABLET | Freq: Four times a day (QID) | ORAL | Status: DC | PRN
  Administered 2017-09-15 – 2017-09-16 (×3): 50 mg via ORAL
  Filled 2017-09-15 (×3): qty 1

## 2017-09-15 MED ORDER — MAGNESIUM HYDROXIDE 400 MG/5ML PO SUSP: 30.0000 mL | Freq: Every day | ORAL | Status: DC | PRN

## 2017-09-15 MED ORDER — LISINOPRIL 10 MG PO TABS: 10.0000 mg | ORAL_TABLET | Freq: Every day | ORAL | Status: DC

## 2017-09-15 MED ORDER — LACTULOSE 10 GM/15ML PO SOLN
20.0000 g | Freq: Every day | ORAL | Status: DC | PRN
Start: 2017-09-15 — End: 2017-09-16

## 2017-09-15 MED ORDER — AMIODARONE HCL 200 MG PO TABS: 400.0000 mg | ORAL_TABLET | Freq: Every day | ORAL | Status: DC

## 2017-09-15 MED ORDER — BISACODYL 5 MG PO TBEC
10.0000 mg | DELAYED_RELEASE_TABLET | Freq: Every day | ORAL | Status: DC
  Administered 2017-09-16: 10 mg via ORAL
  Filled 2017-09-15: qty 2

## 2017-09-15 MED ORDER — METOPROLOL TARTRATE 50 MG PO TABS
50.0000 mg | ORAL_TABLET | Freq: Two times a day (BID) | ORAL | Status: DC
  Administered 2017-09-15 – 2017-09-16 (×2): 50 mg via ORAL
  Filled 2017-09-15 (×2): qty 1

## 2017-09-15 MED ORDER — ONDANSETRON HCL 4 MG/2ML IJ SOLN
INTRAMUSCULAR | Status: DC | PRN
  Administered 2017-09-15: 4 mg via INTRAVENOUS

## 2017-09-15 MED ORDER — BUTAMBEN-TETRACAINE-BENZOCAINE 2-2-14 % EX AERO
INHALATION_SPRAY | CUTANEOUS | Status: DC | PRN
  Administered 2017-09-15: 2 via TOPICAL

## 2017-09-15 MED ORDER — AMIODARONE HCL 200 MG PO TABS
400.0000 mg | ORAL_TABLET | Freq: Two times a day (BID) | ORAL | Status: DC
  Administered 2017-09-15 – 2017-09-16 (×2): 400 mg via ORAL
  Filled 2017-09-15 (×2): qty 2

## 2017-09-15 MED ORDER — SODIUM CHLORIDE 0.9 % IV SOLN: INTRAVENOUS | Status: DC

## 2017-09-15 MED ORDER — ONDANSETRON HCL 4 MG/2ML IJ SOLN: 4.0000 mg | Freq: Four times a day (QID) | INTRAMUSCULAR | Status: DC | PRN

## 2017-09-15 MED ORDER — ACETAMINOPHEN 325 MG PO TABS
650.0000 mg | ORAL_TABLET | Freq: Four times a day (QID) | ORAL | Status: DC | PRN
  Administered 2017-09-15 – 2017-09-16 (×3): 650 mg via ORAL
  Filled 2017-09-15 (×3): qty 2

## 2017-09-15 MED ORDER — PROMETHAZINE HCL 25 MG/ML IJ SOLN: 6.2500 mg | INTRAMUSCULAR | Status: DC | PRN

## 2017-09-15 MED ORDER — PANTOPRAZOLE SODIUM 40 MG PO TBEC
40.0000 mg | DELAYED_RELEASE_TABLET | Freq: Every day | ORAL | Status: DC
  Administered 2017-09-16: 40 mg via ORAL
  Filled 2017-09-15: qty 1

## 2017-09-15 MED ORDER — SODIUM CHLORIDE 0.9 % IV SOLN: 250.0000 mL | INTRAVENOUS | Status: DC | PRN

## 2017-09-15 MED ORDER — SODIUM CHLORIDE 0.9% FLUSH
3.0000 mL | Freq: Two times a day (BID) | INTRAVENOUS | Status: DC
  Administered 2017-09-15: 3 mL via INTRAVENOUS

## 2017-09-15 MED ORDER — PROPOFOL 10 MG/ML IV BOLUS
INTRAVENOUS | Status: DC | PRN
  Administered 2017-09-15: 20 mg via INTRAVENOUS
  Administered 2017-09-15: 40 mg via INTRAVENOUS
  Administered 2017-09-15: 30 mg via INTRAVENOUS
  Administered 2017-09-15: 20 mg via INTRAVENOUS
  Administered 2017-09-15: 30 mg via INTRAVENOUS

## 2017-09-15 MED ORDER — TAMSULOSIN HCL 0.4 MG PO CAPS
0.4000 mg | ORAL_CAPSULE | Freq: Every day | ORAL | Status: DC
  Administered 2017-09-15: 0.4 mg via ORAL
  Filled 2017-09-15: qty 1

## 2017-09-15 MED ORDER — ALUM & MAG HYDROXIDE-SIMETH 200-200-20 MG/5ML PO SUSP: 15.0000 mL | Freq: Four times a day (QID) | ORAL | Status: DC | PRN

## 2017-09-15 MED ORDER — ZOLPIDEM TARTRATE 5 MG PO TABS: 5.0000 mg | ORAL_TABLET | Freq: Every evening | ORAL | Status: DC | PRN

## 2017-09-15 MED ORDER — APIXABAN 5 MG PO TABS
5.0000 mg | ORAL_TABLET | Freq: Two times a day (BID) | ORAL | Status: DC
  Administered 2017-09-15 – 2017-09-16 (×2): 5 mg via ORAL
  Filled 2017-09-15 (×2): qty 1

## 2017-09-15 MED ORDER — PROPOFOL 500 MG/50ML IV EMUL
INTRAVENOUS | Status: DC | PRN
  Administered 2017-09-15: 100 ug/kg/min via INTRAVENOUS

## 2017-09-15 MED ORDER — BISACODYL 10 MG RE SUPP: 10.0000 mg | Freq: Every day | RECTAL | Status: DC

## 2017-09-15 MED ORDER — ASPIRIN EC 81 MG PO TBEC
81.0000 mg | DELAYED_RELEASE_TABLET | Freq: Every day | ORAL | Status: DC
  Administered 2017-09-16: 81 mg via ORAL
  Filled 2017-09-15: qty 1

## 2017-09-15 MED ORDER — DOCUSATE SODIUM 100 MG PO CAPS
200.0000 mg | ORAL_CAPSULE | Freq: Every day | ORAL | Status: DC
  Administered 2017-09-16: 200 mg via ORAL
  Filled 2017-09-15: qty 2

## 2017-09-15 MED ORDER — LISINOPRIL 10 MG PO TABS
10.0000 mg | ORAL_TABLET | Freq: Every day | ORAL | Status: DC
  Administered 2017-09-16: 10 mg via ORAL
  Filled 2017-09-15: qty 1

## 2017-09-15 MED ORDER — OXYCODONE HCL 5 MG PO TABS: 5.0000 mg | ORAL_TABLET | ORAL | Status: DC | PRN

## 2017-09-15 NOTE — Interval H&P Note (Signed)
History and Physical Interval Note:  09/15/2017 10:34 AM  Matthew Hoffman  has presented today for surgery, with the diagnosis of AFIB  The various methods of treatment have been discussed with the patient and family. After consideration of risks, benefits and other options for treatment, the patient has consented to  Procedure(s): TRANSESOPHAGEAL ECHOCARDIOGRAM (TEE) (N/A) CARDIOVERSION (N/A) as a surgical intervention .  The patient's history has been reviewed, patient examined, no change in status, stable for surgery.  I have reviewed the patient's chart and labs.  Questions were answered to the patient's satisfaction.     Charlton HawsPeter Jayliana Valencia

## 2017-09-15 NOTE — Progress Notes (Addendum)
  Message  Received: Yesterday  Message Contents  Elvis CoilConte, Tessa N, PA-C  Trent, Patricia H        On 3/14 Mr. Lillia MountainWilkins underwent a Repair of sinus of Valsalva aneurysm with reimplantation of aortic valve using a 30-mm Valsalva Gelweave graft with Dr. Dorris FetchHendrickson. He will need a 2 week follow-up appointment.   Referring physician: Dr. Ralene Okoy Moreira   Thanks!            See message above. I am covering Trish inbox today. Appt requested by [facility]. Spoke with scheduling, next available in that time frame is 4/9 with Harriet PhoK Lawrence. Appt placed in f/u section. Zuriel Roskos PA-C

## 2017-09-15 NOTE — Progress Notes (Signed)
[id number] Discussed with pt sternal restrictions and gave in the tube handout. Encouraged IS and walking for ex. Gave walking instructions. Discussed with pt that A1C at 5.8 and to watch carbs. Heart healthy diet given. Discussed CRP 2 and gave brochure. [facility]. Discussed no more cigars and pt in agreement. RN here to take pt to [facility]. Will follow up tomorrow. Luetta NuttingCharlene Hadi Dubin RN BSN 09/15/2017 10:54 AM

## 2017-09-15 NOTE — H&P (View-Only) (Signed)
 Progress Note  Patient Name: Matthew Hoffman Date of Encounter: 09/15/2017  Primary Cardiologist: Janann Boeve, MD   Subjective   No chest pain or dyspnea  Inpatient Medications    Scheduled Meds: . amiodarone  400 mg Oral Q12H   Followed by  . [START ON 09/18/2017] amiodarone  400 mg Oral Daily  . apixaban  5 mg Oral BID  . aspirin EC  81 mg Oral Daily  . bisacodyl  10 mg Oral Daily   Or  . bisacodyl  10 mg Rectal Daily  . docusate sodium  200 mg Oral Daily  . metoprolol tartrate  50 mg Oral BID  . pantoprazole  40 mg Oral Daily  . sodium chloride flush  3 mL Intravenous Q12H  . tamsulosin  0.4 mg Oral QHS   Continuous Infusions: . sodium chloride     PRN Meds: sodium chloride, alum & mag hydroxide-simeth, lactulose, magnesium hydroxide, ondansetron (ZOFRAN) IV, oxyCODONE, sodium chloride flush, traMADol, zolpidem   Vital Signs    Vitals:   09/14/17 1100 09/14/17 2032 09/15/17 0311 09/15/17 0325  BP: (!) 113/100 (!) 134/94  (!) 131/97  Pulse: (!) 120 (!) 120  (!) 119  Resp:  19  20  Temp:  98.4 F (36.9 C)  99 F (37.2 C)  TempSrc:  Oral  Oral  SpO2:  100%  96%  Weight:   293 lb 6.4 oz (133.1 kg)   Height:        Intake/Output Summary (Last 24 hours) at 09/15/2017 0818 Last data filed at 09/14/2017 2032 Gross per 24 hour  Intake 120 ml  Output -  Net 120 ml   Filed Weights   09/13/17 0526 09/14/17 0321 09/15/17 0311  Weight: 294 lb 12.8 oz (133.7 kg) 294 lb 11.2 oz (133.7 kg) 293 lb 6.4 oz (133.1 kg)    Telemetry    Atrial flutter with elevated rate- Personally Reviewed   Physical Exam   GEN: WD/WN No acute distress.   Neck: supple Cardiac: tachycardic, regular Respiratory: CTA; s/p sternotomy GI: Soft, NT/ND MS: No edema Neuro:  Grossly intact   Labs    Chemistry Recent Labs  Lab 09/11/17 0533 09/12/17 0505 09/13/17 0250  NA 135 134* 135  K 3.7 3.4* 3.8  CL 101 100* 100*  CO2 25 25 26  GLUCOSE 121* 121* 117*  BUN 15 15 15   CREATININE 1.06 1.05 1.09  CALCIUM 8.1* 8.0* 8.1*  GFRNONAA >60 >60 >60  GFRAA >60 >60 >60  ANIONGAP 9 9 9     Hematology Recent Labs  Lab 09/10/17 0442 09/12/17 0505 09/13/17 0250  WBC 9.9 6.8 5.7  RBC 4.34 3.99* 3.82*  HGB 11.8* 10.8* 10.3*  HCT 35.4* 31.8* 30.8*  MCV 81.6 79.7 80.6  MCH 27.2 27.1 27.0  MCHC 33.3 34.0 33.4  RDW 14.2 14.0 14.6  PLT 126* 178 217     Radiology    Dg Chest 2 View  Result Date: 09/14/2017 CLINICAL DATA:  Status post sinus of Valsalva aneurysm repair. EXAM: CHEST - 2 VIEW COMPARISON:  September 12, 2017 FINDINGS: There is atelectatic change in the left lower lobe. Lungs elsewhere clear. Heart is mildly enlarged with pulmonary venous hypertension. Patient is status post median sternotomy. No adenopathy. There is postoperative change in the left shoulder. Temporary pacemaker wires are attached to the right heart. IMPRESSION: Pulmonary vascular congestion. Left lower lobe atelectatic change. Lungs elsewhere clear. Postoperative change in the mediastinum and left shoulder. Electronically Signed     By: William  Woodruff III M.D.   On: 09/14/2017 10:36    Patient Profile     52-year-old male status post recent repair of sinus of Valsalva aneurysm with resuspension of the aortic valve for evaluation of postoperative atrial flutter.      Assessment & Plan    1 postoperative atrial flutter-patient remains in atrial flutter this AM and rate continues to be elevated.  Continue apixaban.  Continue amiodarone and metoprolol.  Proceed with TEE guided cardioversion today.  I do not think he will require long-term anticoagulation as this appears to be postoperative atrial arrhythmia.    2 status post sinus of Valsalva aneurysm repair-Per CVTS. Will need fu CTAs in the future  3 hypertension-blood pressure is elevated; add lisinopril 10 mg daily and follow.  Pt can be DCed from a cardiac standpoint later today if he holds sinus rhythm following DCCV; fu with  me 6-8 weeks.  For questions or updates, please contact [facility] Please consult [email or web address] for contact info under Cardiology/STEMI.      Signed, Yonatan Guitron, MD  09/15/2017, 8:18 AM    

## 2017-09-15 NOTE — Anesthesia Postprocedure Evaluation (Signed)
Anesthesia Post Note  Patient: Matthew Hoffman  Procedure(s) Performed: TRANSESOPHAGEAL ECHOCARDIOGRAM (TEE) (N/A ) CARDIOVERSION (N/A )     Patient location during evaluation: PACU Anesthesia Type: MAC Level of consciousness: oriented, patient cooperative and awake and alert Pain management: pain level controlled Vital Signs Assessment: post-procedure vital signs reviewed and stable Respiratory status: spontaneous breathing, nonlabored ventilation, respiratory function stable and patient connected to nasal cannula oxygen Cardiovascular status: blood pressure returned to baseline and stable Postop Assessment: no apparent nausea or vomiting Anesthetic complications: no    Last Vitals:  Vitals Value Taken Time  BP    Temp    Pulse    Resp 10 09/15/2017 12:35 PM  SpO2    Vitals shown include unvalidated device data.  Last Pain:  Vitals:   09/15/17 1155  TempSrc:   PainSc: 0-No pain                 Tommie Dejoseph,E. Ples Trudel

## 2017-09-15 NOTE — Progress Notes (Signed)
Progress Note  Patient Name: Matthew Hoffman Date of Encounter: 09/15/2017  Primary Cardiologist: Olga MillersBrian Miyeko Mahlum, MD   Subjective   No chest pain or dyspnea  Inpatient Medications    Scheduled Meds: . amiodarone  400 mg Oral Q12H   Followed by  . [START ON 09/18/2017] amiodarone  400 mg Oral Daily  . apixaban  5 mg Oral BID  . aspirin EC  81 mg Oral Daily  . bisacodyl  10 mg Oral Daily   Or  . bisacodyl  10 mg Rectal Daily  . docusate sodium  200 mg Oral Daily  . metoprolol tartrate  50 mg Oral BID  . pantoprazole  40 mg Oral Daily  . sodium chloride flush  3 mL Intravenous Q12H  . tamsulosin  0.4 mg Oral QHS   Continuous Infusions: . sodium chloride     PRN Meds: sodium chloride, alum & mag hydroxide-simeth, lactulose, magnesium hydroxide, ondansetron (ZOFRAN) IV, oxyCODONE, sodium chloride flush, traMADol, zolpidem   Vital Signs    Vitals:   09/14/17 1100 09/14/17 2032 09/15/17 0311 09/15/17 0325  BP: (!) 113/100 (!) 134/94  (!) 131/97  Pulse: (!) 120 (!) 120  (!) 119  Resp:  19  20  Temp:  98.4 F (36.9 C)  99 F (37.2 C)  TempSrc:  Oral  Oral  SpO2:  100%  96%  Weight:   293 lb 6.4 oz (133.1 kg)   Height:        Intake/Output Summary (Last 24 hours) at 09/15/2017 0818 Last data filed at 09/14/2017 2032 Gross per 24 hour  Intake 120 ml  Output -  Net 120 ml   Filed Weights   09/13/17 0526 09/14/17 0321 09/15/17 0311  Weight: 294 lb 12.8 oz (133.7 kg) 294 lb 11.2 oz (133.7 kg) 293 lb 6.4 oz (133.1 kg)    Telemetry    Atrial flutter with elevated rate- Personally Reviewed   Physical Exam   GEN: WD/WN No acute distress.   Neck: supple Cardiac: tachycardic, regular Respiratory: CTA; s/p sternotomy GI: Soft, NT/ND MS: No edema Neuro:  Grossly intact   Labs    Chemistry Recent Labs  Lab 09/11/17 0533 09/12/17 0505 09/13/17 0250  NA 135 134* 135  K 3.7 3.4* 3.8  CL 101 100* 100*  CO2 25 25 26   GLUCOSE 121* 121* 117*  BUN 15 15 15    CREATININE 1.06 1.05 1.09  CALCIUM 8.1* 8.0* 8.1*  GFRNONAA >60 >60 >60  GFRAA >60 >60 >60  ANIONGAP 9 9 9      Hematology Recent Labs  Lab 09/10/17 0442 09/12/17 0505 09/13/17 0250  WBC 9.9 6.8 5.7  RBC 4.34 3.99* 3.82*  HGB 11.8* 10.8* 10.3*  HCT 35.4* 31.8* 30.8*  MCV 81.6 79.7 80.6  MCH 27.2 27.1 27.0  MCHC 33.3 34.0 33.4  RDW 14.2 14.0 14.6  PLT 126* 178 217     Radiology    Dg Chest 2 View  Result Date: 09/14/2017 CLINICAL DATA:  Status post sinus of Valsalva aneurysm repair. EXAM: CHEST - 2 VIEW COMPARISON:  September 12, 2017 FINDINGS: There is atelectatic change in the left lower lobe. Lungs elsewhere clear. Heart is mildly enlarged with pulmonary venous hypertension. Patient is status post median sternotomy. No adenopathy. There is postoperative change in the left shoulder. Temporary pacemaker wires are attached to the right heart. IMPRESSION: Pulmonary vascular congestion. Left lower lobe atelectatic change. Lungs elsewhere clear. Postoperative change in the mediastinum and left shoulder. Electronically Signed  By: Bretta Bang III M.D.   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      Assessment & Plan    1 postoperative atrial flutter-patient remains in atrial flutter this AM and rate continues to be elevated.  Continue apixaban.  Continue amiodarone and metoprolol.  Proceed with TEE guided cardioversion today.  I do not think he will require long-term anticoagulation as this appears to be postoperative atrial arrhythmia.    2 status post sinus of Valsalva aneurysm repair-Per CVTS. Will need fu CTAs in the future  3 hypertension-blood pressure is elevated; add lisinopril 10 mg daily and follow.  Pt can be DCed from a cardiac standpoint later today if he holds sinus rhythm following DCCV; fu with  me 6-8 weeks.  For questions or updates, please contact [facility] Please consult [email or web address] for contact info under Cardiology/STEMI.      Signed, Olga Millers, MD  09/15/2017, 8:18 AM

## 2017-09-15 NOTE — CV Procedure (Signed)
TEE/DCC Anesthesia: Propofol Dr Rosalene Billingsarswell  EF normal 60% Biologic valved aortic conduit no AR leaflets normal Root 3.0 cm Trivial MR No LAA thrombus No ASD/PFO No aortic debris  DCC x 1 120 J biphasic Converted from flutter rate 120 to NSR rate 90 No immediate neurologic sequelae On Rx Eliquis  [employer]ions Financial CorporationPeter Nishan

## 2017-09-15 NOTE — Anesthesia Preprocedure Evaluation (Addendum)
Anesthesia Evaluation  Patient identified by MRN, date of birth, ID band Patient awake    Reviewed: Allergy & Precautions, NPO status , Patient's Chart, lab work & pertinent test results  History of Anesthesia Complications Negative for: history of anesthetic complications  Airway Mallampati: I  TM Distance: >3 FB Neck ROM: Full    Dental  (+) Dental Advisory Given   Pulmonary sleep apnea and Continuous Positive Airway Pressure Ventilation , former smoker,    breath sounds clear to auscultation       Cardiovascular hypertension, Pt. on medications (-) angina+ Peripheral Vascular Disease (s/p ascending aorta repair)  + dysrhythmias Atrial Fibrillation  Rhythm:Irregular Rate:Normal  09/08/17 TEE: normal LVF, valves OK   Neuro/Psych    GI/Hepatic negative GI ROS, Neg liver ROS,   Endo/Other  Morbid obesity  Renal/GU negative Renal ROS     Musculoskeletal  (+) Arthritis ,   Abdominal (+) + obese,   Peds  Hematology  (+) Blood dyscrasia (Hb 10.3, plt 217k), ,   Anesthesia Other Findings   Reproductive/Obstetrics                            Anesthesia Physical Anesthesia Plan  ASA: III  Anesthesia Plan: MAC   Post-op Pain Management:    Induction:   PONV Risk Score and Plan: 1 and Ondansetron and Treatment may vary due to age or medical condition  Airway Management Planned: Natural Airway and Nasal Cannula  Additional Equipment:   Intra-op Plan:   Post-operative Plan:   Informed Consent: I have reviewed the patients History and Physical, chart, labs and discussed the procedure including the risks, benefits and alternatives for the proposed anesthesia with the patient or authorized representative who has indicated his/her understanding and acceptance.   Dental advisory given  Plan Discussed with: CRNA and Surgeon  Anesthesia Plan Comments: (Plan routine monitors, MAC)        Anesthesia Quick Evaluation

## 2017-09-15 NOTE — Progress Notes (Addendum)
  Transesophageal echocardiogram has been performed.   Roosvelt MaserLane, Cevin Rubinstein F 09/15/2017, 11:55 AM

## 2017-09-15 NOTE — Progress Notes (Addendum)
[address]       [place] [id number]             [phone]      7 Days Post-Op Procedure(s) (LRB): REPAIR OF SINUS OF VALSALVA ANEURYSM (N/A) TRANSESOPHAGEAL ECHOCARDIOGRAM (TEE) (N/A) Subjective: Looks and feels well  Objective: Vital signs in last 24 hours: Temp:  [98.4 F (36.9 C)-99 F (37.2 C)] 99 F (37.2 C) (03/21 0325) Pulse Rate:  [119-120] 119 (03/21 0325) Cardiac Rhythm: Atrial fibrillation;Atrial flutter (03/21 0325) Resp:  [19-20] 20 (03/21 0325) BP: (113-134)/(94-100) 131/97 (03/21 0325) SpO2:  [96 %-100 %] 96 % (03/21 0325) Weight:  [293 lb 6.4 oz (133.1 kg)] 293 lb 6.4 oz (133.1 kg) (03/21 0311)  Hemodynamic parameters for last 24 hours:    Intake/Output from previous day: 03/20 0701 - 03/21 0700 In: 245 [P.O.:245] Out: -  Intake/Output this shift: No intake/output data recorded.  General appearance: alert, cooperative and no distress Heart: regular rate and rhythm and tachy, no murmur Lungs: clear to auscultation bilaterally Abdomen: soft, nontender Extremities: + LE edema- minor Wound: incis healing well  Lab Results: Recent Labs    09/13/17 0250  WBC 5.7  HGB 10.3*  HCT 30.8*  PLT 217   BMET:  Recent Labs    09/13/17 0250  NA 135  K 3.8  CL 100*  CO2 26  GLUCOSE 117*  BUN 15  CREATININE 1.09  CALCIUM 8.1*    PT/INR:  Recent Labs    09/13/17 0250  LABPROT 14.4  INR 1.13   ABG    Component Value Date/Time   PHART 7.329 (L) 09/08/2017 2047   HCO3 19.2 (L) 09/08/2017 2047   TCO2 23 09/09/2017 1642   ACIDBASEDEF 6.0 (H) 09/08/2017 2047   O2SAT 99.0 09/08/2017 2047   CBG (last 3)  No results for input(s): GLUCAP in the last 72 hours.  Meds Scheduled Meds: . amiodarone  400 mg Oral Q12H   Followed by  . [START ON 09/18/2017] amiodarone  400 mg Oral Daily  . apixaban  5 mg Oral BID  . aspirin EC  81 mg Oral Daily  . bisacodyl  10 mg Oral Daily   Or  . bisacodyl  10 mg Rectal Daily  .  docusate sodium  200 mg Oral Daily  . metoprolol tartrate  50 mg Oral BID  . pantoprazole  40 mg Oral Daily  . sodium chloride flush  3 mL Intravenous Q12H  . tamsulosin  0.4 mg Oral QHS   Continuous Infusions: . sodium chloride     PRN Meds:.sodium chloride, alum & mag hydroxide-simeth, lactulose, magnesium hydroxide, ondansetron (ZOFRAN) IV, oxyCODONE, sodium chloride flush, traMADol, zolpidem  Xrays Dg Chest 2 View  Result Date: 09/14/2017 CLINICAL DATA:  Status post sinus of Valsalva aneurysm repair. EXAM: CHEST - 2 VIEW COMPARISON:  September 12, 2017 FINDINGS: There is atelectatic change in the left lower lobe. Lungs elsewhere clear. Heart is mildly enlarged with pulmonary venous hypertension. Patient is status post median sternotomy. No adenopathy. There is postoperative change in the left shoulder. Temporary pacemaker wires are attached to the right heart. IMPRESSION: Pulmonary vascular congestion. Left lower lobe atelectatic change. Lungs elsewhere clear. Postoperative change in the mediastinum and left shoulder. Electronically Signed   By: Bretta BangWilliam  Woodruff III M.D.   On: 09/14/2017 10:36    Assessment/Plan: S/P Procedure(s) (LRB): REPAIR OF SINUS OF VALSALVA ANEURYSM (N/A) TRANSESOPHAGEAL ECHOCARDIOGRAM (TEE) (N/A)  1 overall conts to do  very well 2 afib/flutter, and some sinus tachy- scheduled for cardioversion today per cardiology- he is on Eliquis. Cont current meds 3 rehab is going well- ambulating without problem 4 cont routine pulm toilet 5 no new labs today 6 sugars - fairly good control, Hgb A1c is 5.8, with metabolic syndrome( obesity, HTN , insulin resistance) needs lifestyle management - he seems pretty motivated .  7 consider restart ARB/HCTZ combo after cardioversion with diastolic HTN 8 prob home 1-2 days  LOS: 7 days    Rowe Clack 09/15/2017 Patient seen and examined, agree with above Will dc pacing wires after cardioversion Hopefully can go home over the  weekend  Viviann Spare C. Dorris Fetch, MD [facility] [phone]

## 2017-09-15 NOTE — Transfer of Care (Signed)
Immediate Anesthesia Transfer of Care Note  Patient: Matthew Hoffman  Procedure(s) Performed: TRANSESOPHAGEAL ECHOCARDIOGRAM (TEE) (N/A ) CARDIOVERSION (N/A )  Patient Location: Endoscopy Unit  Anesthesia Type:MAC  Level of Consciousness: awake, alert  and oriented  Airway & Oxygen Therapy: Patient Spontanous Breathing and Patient connected to nasal cannula oxygen  Post-op Assessment: Report given to RN and Post -op Vital signs reviewed and stable  Post vital signs: Reviewed and stable  Last Vitals:  Vitals Value Taken Time  BP 134/32 09/15/2017 11:39 AM  Temp    Pulse 74 09/15/2017 11:39 AM  Resp 14 09/15/2017 11:40 AM  SpO2 92 % 09/15/2017 11:39 AM  Vitals shown include unvalidated device data.  Last Pain:  Vitals:   09/15/17 1049  TempSrc: Oral  PainSc: 0-No pain      Patients Stated Pain Goal: 2 (59/16/38 4665)  Complications: No apparent anesthesia complications

## 2017-09-15 NOTE — Progress Notes (Signed)
Pt ambulated 470 ft with standby assist, steady gait, no complaints, no rhythm changes.  To bed after walk, PRNs given as requested.  Lights dimmed and call bell in reach.  Will con't plan of care.

## 2017-09-16 DIAGNOSIS — I483 Typical atrial flutter: Secondary | ICD-10-CM

## 2017-09-16 MED ORDER — AMIODARONE HCL 200 MG PO TABS: ORAL_TABLET | ORAL | 1 refills | Status: DC

## 2017-09-16 MED ORDER — LISINOPRIL 10 MG PO TABS: 10.0000 mg | ORAL_TABLET | Freq: Every day | ORAL | 1 refills | Status: DC

## 2017-09-16 MED ORDER — APIXABAN 5 MG PO TABS: 5.0000 mg | ORAL_TABLET | Freq: Two times a day (BID) | ORAL | 1 refills | Status: DC

## 2017-09-16 MED ORDER — AMIODARONE HCL 200 MG PO TABS: 200.0000 mg | ORAL_TABLET | Freq: Every day | ORAL | Status: DC

## 2017-09-16 MED ORDER — METOPROLOL TARTRATE 50 MG PO TABS: 50.0000 mg | ORAL_TABLET | Freq: Two times a day (BID) | ORAL | 1 refills | Status: DC

## 2017-09-16 MED ORDER — TRAMADOL HCL 50 MG PO TABS: 50.0000 mg | ORAL_TABLET | Freq: Four times a day (QID) | ORAL | 0 refills | Status: DC | PRN

## 2017-09-16 MED ORDER — ACETAMINOPHEN 325 MG PO TABS: 650.0000 mg | ORAL_TABLET | Freq: Four times a day (QID) | ORAL | Status: DC | PRN

## 2017-09-16 NOTE — Progress Notes (Addendum)
      [address]       [place] [id number]             [phone]      1 Day Post-Op Procedure(s) (LRB): TRANSESOPHAGEAL ECHOCARDIOGRAM (TEE) (N/A) CARDIOVERSION (N/A) Subjective: Feels okay this morning. He was cold then he became hot. Now he is okay.   Objective: Vital signs in last 24 hours: Temp:  [97.9 F (36.6 C)-99 F (37.2 C)] 99 F (37.2 C) (03/22 0609) Pulse Rate:  [72-120] 73 (03/22 0609) Cardiac Rhythm: Atrial flutter;Atrial fibrillation (03/21 1950) Resp:  [15-23] 17 (03/22 0609) BP: (88-151)/(32-111) 120/82 (03/22 0609) SpO2:  [92 %-100 %] 94 % (03/22 0609) Weight:  [294 lb 8.6 oz (133.6 kg)] 294 lb 8.6 oz (133.6 kg) (03/22 0435)     Intake/Output from previous day: 03/21 0701 - 03/22 0700 In: 1360 [P.O.:960; I.V.:400] Out: -  Intake/Output this shift: No intake/output data recorded.  General appearance: alert, cooperative and no distress Heart: regular rate and rhythm, S1, S2 normal, no murmur, click, rub or gallop Lungs: clear to auscultation bilaterally Abdomen: soft, non-tender; bowel sounds normal; no masses,  no organomegaly Extremities: extremities normal, atraumatic, no cyanosis or edema Wound: clean and dry  Lab Results: No results for input(s): WBC, HGB, HCT, PLT in the last 72 hours. BMET: No results for input(s): NA, K, CL, CO2, GLUCOSE, BUN, CREATININE, CALCIUM in the last 72 hours.  PT/INR: No results for input(s): LABPROT, INR in the last 72 hours. ABG    Component Value Date/Time   PHART 7.329 (L) 09/08/2017 2047   HCO3 19.2 (L) 09/08/2017 2047   TCO2 23 09/09/2017 1642   ACIDBASEDEF 6.0 (H) 09/08/2017 2047   O2SAT 99.0 09/08/2017 2047   CBG (last 3)  No results for input(s): GLUCAP in the last 72 hours.  Assessment/Plan: S/P Procedure(s) (LRB): TRANSESOPHAGEAL ECHOCARDIOGRAM (TEE) (N/A) CARDIOVERSION (N/A)  1. CV-s/p cardioversion yesterday. NSR in the 70s this morning. Pacing wires out this am. On Amio,  Metoprolol and Eliquis.  2. Pulm-tolerating room air with good oxygen saturation. No issues. Encourage incentive spirometer.  3. Renal-creatinine 1.09, electrolytes okay. 4. H and H has been stable 5. Endo-blood glucose level well controlled.   Plan: Discontinue EPW per protocol. Ambulate today in the halls. Work on a bowel movement. He is on multiple stool softeners and laxatives.     LOS: 8 days    Sharlene Doryessa N Conte 09/16/2017 Back in SR after cardioversion Dc pacing wires Home later today if no issues  Viviann SpareSteven C. Dorris FetchHendrickson, MD [facility] [phone]

## 2017-09-16 NOTE — Progress Notes (Addendum)
EPWs removed per order and unit protocol.  Pt tolerated very well, all tips intact, sites unremarkable.  Pt understands bedrest for one hr, with frequent VS checks.  CCMD notified, will monitor closely

## 2017-09-16 NOTE — Care Management Note (Signed)
Case Management Note Donn PieriniKristi Leanna Hamid RN, BSN Unit 4E-Case Manager-- 2H coverage [phone]  Patient Details  Name: Matthew GuilesFoster L Hoffman MRN: [id number] Date of Birth: 10/28/1965  Subjective/Objective:  Pt admitted s/p repair of sinus of valsalva aneurysm                 Action/Plan: PTA pt lived at home, independent- anticipate return home- CM to follow for transition of care needs.   Expected Discharge Date:  09/16/17               Expected Discharge Plan:  Home/Self Care  In-House Referral:  NA  Discharge planning Services  CM Consult  Post Acute Care Choice:  NA Choice offered to:  NA  DME Arranged:    DME Agency:     HH Arranged:    HH Agency:     Status of Service:  Completed, signed off  If discussed at Long Length of Stay Meetings, dates discussed:    Discharge Disposition: home/self care   Additional Comments:  09/16/17- 1540- Donn PieriniKristi Kanyah Matsushima RN, CM- pt s/p TEE cardioversion on 09/15/17- stable for discharge home today- no CM needs noted for transition home- pt to d/c home with family.   Darrold SpanWebster, Hilarie Sinha Hall, RN 09/16/2017, 3:39 PM

## 2017-09-16 NOTE — Progress Notes (Signed)
Progress Note  Patient Name: Matthew Hoffman Date of Encounter: 09/16/2017  Primary Cardiologist: Dr. Jens Somrenshaw  Subjective   No chest pain or dyspnea  Inpatient Medications    Scheduled Meds: . amiodarone  400 mg Oral Q12H  . [START ON 09/18/2017] amiodarone  400 mg Oral Daily  . apixaban  5 mg Oral BID  . aspirin EC  81 mg Oral Daily  . bisacodyl  10 mg Oral Daily  . bisacodyl  10 mg Rectal Daily  . docusate sodium  200 mg Oral Daily  . lisinopril  10 mg Oral Daily  . metoprolol tartrate  50 mg Oral BID  . pantoprazole  40 mg Oral Daily  . sodium chloride flush  3 mL Intravenous Q12H  . tamsulosin  0.4 mg Oral QHS   Continuous Infusions: . sodium chloride     PRN Meds: sodium chloride, acetaminophen, alum & mag hydroxide-simeth, lactulose, magnesium hydroxide, ondansetron (ZOFRAN) IV, oxyCODONE, sodium chloride flush, traMADol, zolpidem   Vital Signs    Vitals:   09/15/17 2011 09/16/17 0435 09/16/17 0530 09/16/17 0609  BP: 132/83  (!) 149/84 120/82  Pulse: 80  81 73  Resp: (!) 23  20 17   Temp: 98.9 F (37.2 C)  98.6 F (37 C) 99 F (37.2 C)  TempSrc: Oral  Oral Oral  SpO2: 92%  94% 94%  Weight:  294 lb 8.6 oz (133.6 kg)    Height:        Intake/Output Summary (Last 24 hours) at 09/16/2017 0757 Last data filed at 09/15/2017 1821 Gross per 24 hour  Intake 1360 ml  Output -  Net 1360 ml   Filed Weights   09/14/17 0321 09/15/17 0311 09/16/17 0435  Weight: 294 lb 11.2 oz (133.7 kg) 293 lb 6.4 oz (133.1 kg) 294 lb 8.6 oz (133.6 kg)     Physical Exam   General: Well developed, well nourished, male appearing in no acute distress. Head: Normal Neck: Supple Lungs:  CTA. S/P sternotomy Heart: RRR Abdomen: Soft, NT/ND Extremities: Trace edema.  Neuro: Alert and oriented X 3. Moves all extremities spontaneously.   Labs    Chemistry Recent Labs  Lab 09/11/17 0533 09/12/17 0505 09/13/17 0250  NA 135 134* 135  K 3.7 3.4* 3.8  CL 101 100* 100*    CO2 25 25 26   GLUCOSE 121* 121* 117*  BUN 15 15 15   CREATININE 1.06 1.05 1.09  CALCIUM 8.1* 8.0* 8.1*  GFRNONAA >60 >60 >60  GFRAA >60 >60 >60  ANIONGAP 9 9 9      Hematology Recent Labs  Lab 09/10/17 0442 09/12/17 0505 09/13/17 0250  WBC 9.9 6.8 5.7  RBC 4.34 3.99* 3.82*  HGB 11.8* 10.8* 10.3*  HCT 35.4* 31.8* 30.8*  MCV 81.6 79.7 80.6  MCH 27.2 27.1 27.0  MCHC 33.3 34.0 33.4  RDW 14.2 14.0 14.6  PLT 126* 178 217    Radiology    Dg Chest 2 View  Result Date: 09/14/2017 CLINICAL DATA:  Status post sinus of Valsalva aneurysm repair. EXAM: CHEST - 2 VIEW COMPARISON:  September 12, 2017 FINDINGS: There is atelectatic change in the left lower lobe. Lungs elsewhere clear. Heart is mildly enlarged with pulmonary venous hypertension. Patient is status post median sternotomy. No adenopathy. There is postoperative change in the left shoulder. Temporary pacemaker wires are attached to the right heart. IMPRESSION: Pulmonary vascular congestion. Left lower lobe atelectatic change. Lungs elsewhere clear. Postoperative change in the mediastinum and left shoulder. Electronically Signed  By: Bretta Bang III M.D.   On: 09/14/2017 10:36     Telemetry    NSR - Personally Reviewed    Cardiac Studies   TEE/DCCV 09/15/17: EF normal 60% Biologic valved aortic conduit no AR leaflets normal Root 3.0 cm Trivial MR No LAA thrombus No ASD/PFO No aortic debris  DCC x 1 120 J biphasic Converted from flutter rate 120 to NSR rate 90 No immediate neurologic sequelae On Rx Eliquis  Patient Profile     52 y.o. male status post recent repair ofsinus of Valsalva aneurysm with resuspension of the aortic valve for evaluation of postoperative atrial flutter.  Assessment & Plan    1. Atrial flutter with RVR Pt underwent TEE/DCCV on 09/15/17. He converted to NSR without complications. Amiodarone transitioned to PO dosing. Complete 7 days of 400 mg BID, then 200 mg daily thereafter; can  likely DC amiodarone in 3 months as atrial arrhythmia likely post op. Continue on lopressor 50 mg BID. Continue eliquis; DC in 3 months if he holds sinus.   2. HTN Lisinopril added yesterday. Pressure better controlled today.   3. S/p sinus of valsalva aneurysm repair-per CVTS.  OK to DC for a cardiac standpoint. Will fu with PA 2 weeks and me 3-4 months.   Signed, Olga Millers MD 7:57 AM 09/16/2017

## 2017-09-21 ENCOUNTER — Telehealth (HOSPITAL_COMMUNITY): Payer: Self-pay

## 2017-09-21 NOTE — Telephone Encounter (Signed)
Patients insurance is active and benefits verified through [employer] - No co-pay, deductible amount of $6,750/$6,750 has been met, out of pocket amount of $6,750/$6,750 has been met, no co-insurance, and no pre-authorization is required. Passport/reference [phone]  Will contact patient to see if interested in CR program. If interested, patient will need to complete follow up appt. Once completed, patient will be contacted for scheduling upon review by the RN Navigator.

## 2017-09-21 NOTE — Telephone Encounter (Signed)
Called to speak with patient and see if interested in CR - Patient is interested in the CR program. Matthew FlesherWent over scheduling process and patient stated he understands. He did not want to begin CR until he begins driving. Went over insurance with patient and patient verbally stated he understands. Will contact patient for scheduling after follow up appt has been completed.

## 2017-10-03 NOTE — Progress Notes (Signed)
Cardiology Office Note   Date:  10/04/2017   ID:  Matthew Hoffman, DOB 12/09/1965, MRN [id number]  PCP:  Ralene OkMoreira, Roy, MD  [facility]   Chief Complaint  Patient presents with  . Hospitalization Follow-up     History of Present Illness: Matthew Hoffman is a 52 y.o. 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  He was seen during hospitalization and was continued on amiodarone, metoprolol, and apixaban, and scheduled for TEE guided cardioversion.  Cardioversion was completed by Dr. Charlton HawsPeter Nishan on 09/15/2017, which was successful converting atrial flutter to normal sinus rhythm with a rate of 90 bpm.  TEE revealed: EF normal 60% Biologic valved aortic conduit no AR leaflets normal Root 3.0 cm Trivial MR No LAA thrombus No ASD/PFO No aortic debris  DCC x 1 120 J biphasic Converted from flutter rate 120 to NSR rate 90 No immediate neurologic sequelae On Rx Eliquis  He is here today with multiple questions.  She also continues to have some chest soreness not allowing him to lie flat.  The patient has bought a wedge to have him to be able to sit up partially while sleeping.  He states lying flat pulls across his chest and it is uncomfortable for him.  He is medically compliant he denies any frank bleeding, signs of infection, rapid heart rhythm, or dyspnea.  He is not yet started with cardiac rehab.  He is due to follow-up with Dr. Dorris FetchHendrickson next week to be released to move forward with this.  He has been unable to use his CPAP because of the soreness in his chest and inability to lie flat.  Sitting up with the CPAP is very uncomfortable for him.  Past Medical History:  Diagnosis Date  . Arthritis   . Diverticulosis   . Hiatal hernia    small per ct 2016  . Hypertension   . Hypertrophy of prostate    mild  . OSA on CPAP    per  pt moderate osa per study  . Sinus of Valsalva aneurysm   . Wears glasses     Past Surgical History:  Procedure Laterality Date  . CARDIOVERSION N/A 09/15/2017   Procedure: CARDIOVERSION;  Surgeon: Wendall StadeNishan, Peter C, MD;  [facility];  Service: Cardiovascular;  Laterality: N/A;  . COLON RESECTION    . ELBOW SURGERY Right 1999  . KNEE ARTHROSCOPY Left 1995  . RIGHT/LEFT HEART CATH AND CORONARY ANGIOGRAPHY N/A 08/24/2017   Procedure: RIGHT/LEFT HEART CATH AND CORONARY ANGIOGRAPHY;  Surgeon: Kathleene HazelMcAlhany, Christopher D, MD;  Location: [facility] INVASIVE CV LAB;  Service: Cardiovascular;  Laterality: N/A;  . SHOULDER SURGERY Bilateral right 1992/  left 1987  . TEE WITHOUT CARDIOVERSION N/A 09/08/2017   Procedure: TRANSESOPHAGEAL ECHOCARDIOGRAM (TEE);  Surgeon: Loreli SlotHendrickson, Steven C, MD;  [facility];  Service: Open Heart Surgery;  Laterality: N/A;  . TEE WITHOUT CARDIOVERSION N/A 09/15/2017   Procedure: TRANSESOPHAGEAL ECHOCARDIOGRAM (TEE);  Surgeon: Wendall StadeNishan, Peter C, MD;  [facility];  Service: Cardiovascular;  Laterality: N/A;  . THORACIC AORTIC ANEURYSM REPAIR N/A 09/08/2017   Procedure: REPAIR OF SINUS OF VALSALVA ANEURYSM;  Surgeon: Loreli SlotHendrickson, Steven C, MD;  [facility];  Service: Open Heart Surgery;  Laterality: N/A;  Using 30mm Valsalva Gelweave Graft  . UMBILICAL HERNIA REPAIR  10/2015  . VASECTOMY Bilateral 06/10/2016   Procedure: VASECTOMY;  Surgeon: Mardene CelestePatrick L  McKenzie, MD;  Location: [facility];  Service: Urology;  Laterality: Bilateral;     Current Outpatient Medications  Medication Sig Dispense Refill  . acetaminophen (TYLENOL) 325 MG tablet Take 2 tablets (650 mg total) by mouth every 6 (six) hours as needed for mild pain or moderate pain.    Marland Kitchen amiodarone (PACERONE) 200 MG tablet Please take 400mg  (2 tabs) twice a day for 2 days then take 200mg  (1 tab) twice a day until we see you in follow-up. 70 tablet 1  . apixaban (ELIQUIS) 5 MG TABS tablet  Take 1 tablet (5 mg total) by mouth 2 (two) times daily. 60 tablet 1  . aspirin EC 81 MG tablet Take 81 mg by mouth daily.    Marland Kitchen EPINEPHrine (EPIPEN 2-PAK) 0.3 mg/0.3 mL IJ SOAJ injection Inject 0.3 mg into the muscle as needed (for allergic reaction).     Marland Kitchen guaiFENesin (MUCINEX) 600 MG 12 hr tablet Take 600 mg by mouth 2 (two) times daily as needed for cough or to loosen phlegm.     Marland Kitchen lisinopril (PRINIVIL,ZESTRIL) 10 MG tablet Take 1 tablet (10 mg total) by mouth daily. 30 tablet 1  . metoprolol tartrate (LOPRESSOR) 50 MG tablet Take 1 tablet (50 mg total) by mouth 2 (two) times daily. 60 tablet 1  . Multiple Vitamin (MULTIVITAMIN) tablet Take 1 tablet by mouth daily.    . traMADol (ULTRAM) 50 MG tablet Take 1 tablet (50 mg total) by mouth every 6 (six) hours as needed for moderate pain. (Patient not taking: Reported on 10/04/2017) 30 tablet 0   No current facility-administered medications for this visit.     Allergies:   Shellfish allergy; Oxycodone; and Other    Social History:  The patient  reports that he has quit smoking. His smoking use included cigars. He quit after 0.00 years of use. He has never used smokeless tobacco. He reports that he drinks alcohol. He reports that he does not use drugs.   Family History:  The patient's family history includes COPD in his father; Hypertension in his mother.    ROS: All other systems are reviewed and negative. Unless otherwise mentioned in H&P    PHYSICAL EXAM: VS:  BP 115/64   Pulse (!) 55   Ht 6\' 2"  (1.88 m)   Wt 277 lb 12.8 oz (126 kg)   BMI 35.67 kg/m  , BMI Body mass index is 35.67 kg/m. GEN: Well nourished, well developed, in no acute distress  HEENT: normal  Neck: no JVD, carotid bruits, or masses Cardiac:RRR; very prominent S1, no murmurs, rubs, or gallops,no edema  Respiratory:  clear to auscultation bilaterally, normal work of breathing GI: soft, nontender, nondistended, + BS MS: no deformity or atrophy sternotomy site is  healing well, there is some areas of scabbing that are beginning to fall away.  At the distal portion of the sternotomy sign he does have [place] mild opening but no true evisceration.  I have asked him to place a Band-Aid over it. Skin: warm and dry, no rash Neuro:  Strength and sensation are intact Psych: euthymic mood, full affect   EKG: Sinus bradycardia, heart rate of 53 bpm, nonspecific T wave abnormality laterally..  Recent Labs: 09/06/2017: ALT 20 09/11/2017: Magnesium 2.0; TSH 3.136 09/13/2017: BUN 15; Creatinine, Ser 1.09; Hemoglobin 10.3; Platelets 217; Potassium 3.8; Sodium 135    Lipid Panel No results found for: CHOL, TRIG, HDL, CHOLHDL, VLDL, LDLCALC, LDLDIRECT    Wt Readings from Last 3  Encounters:  10/04/17 277 lb 12.8 oz (126 kg)  09/16/17 294 lb 8.6 oz (133.6 kg)  09/06/17 284 lb (128.8 kg)      Other studies Reviewed: Echocardiogram 08/20/17 Left ventricle: The cavity size was normal. There was moderate   focal basal hypertrophy of the septum with otherwise mild   concentric hypertrophy. Systolic function was normal. The   estimated ejection fraction was in the range of 55% to 60%. Wall   motion was normal; there were no regional wall motion   abnormalities. Features are consistent with a pseudonormal left   ventricular filling pattern, with concomitant abnormal relaxation   and increased filling pressure (grade 2 diastolic dysfunction). - Aortic valve: Severe aneurysmal dilation at the level of the   sinus of Valsalva worse in the left cornary cusp. Transvalvular   velocity was within the normal range. There was no stenosis.   There was mild regurgitation. - Aorta: Ascending aortic diameter: 43.91 mm (S). - Ascending aorta: The ascending aorta was moderately dilated. - Mitral valve: Elongation of the anterior leaflet. Transvalvular   velocity was within the normal range. There was no evidence for   stenosis. There was no regurgitation. - Left atrium: The  atrium was moderately dilated. - Right ventricle: The cavity size was normal. Wall thickness was   normal. Systolic function was normal. - Atrial septum: No defect or patent foramen ovale was identified. - Tricuspid valve: There was trivial regurgitation. - Pulmonary arteries: Systolic pressure was within the normal   range. PA peak pressure: 17 mm Hg (S).  ASSESSMENT AND PLAN:  1.  Paroxysmal atrial flutter: This occurred postoperatively and did require cardioversion.  The patient will continue on Eliquis for a minimum of 6 months.  He will continue amiodarone 200 mg daily and metoprolol 50 mg twice daily.  He offers no symptoms of frank bleeding rapid heart rhythm or recurrent dyspnea.  Check CBC  2.  Aortic valve repair with aortic aneurysm repair: Surgery date 09/08/2017 by Dr. Dorris Fetch, Repair of sinus of Valsalva aneurysm with reimplantation ofaortic valve using a 30-mm Valsalva Gelweave graft (serial #[id number]). He is due to follow with Dr. Orson Aloe next week for a postoperative appointment.  He is not yet begun to drive or participate in cardiac rehab.  Dr. Orson Aloe will release him at his discretion to proceed.  He states he is planned for cardiac rehab sometime in May 2019.  3.  Hypertension: Continue lisinopril 10 mg daily along with metoprolol for heart rate control.  Check BMET  4.  Complaints of questionable hematuria: He states when he begins his stream the urine is pink in color but turns to normal color by the end of his stream.  Will check UA.  Current medicines are reviewed at length with the patient today.    Labs/ tests ordered today include: BMET, CBC, UA Bettey Mare. Liborio Nixon, ANP, AACC   10/04/2017 8:17 AM    [facility] [address], [place], [place] [address] Phone: [phone]; Fax: [phone]

## 2017-10-04 ENCOUNTER — Ambulatory Visit (INDEPENDENT_AMBULATORY_CARE_PROVIDER_SITE_OTHER): Payer: BLUE CROSS/BLUE SHIELD | Admitting: Adult Health

## 2017-10-04 ENCOUNTER — Encounter: Payer: Self-pay | Admitting: Adult Health

## 2017-10-04 VITALS — BP 115/64 | HR 55 | Ht 74.0 in | Wt 277.8 lb

## 2017-10-04 DIAGNOSIS — R399 Unspecified symptoms and signs involving the genitourinary system: Secondary | ICD-10-CM | POA: Diagnosis not present

## 2017-10-04 DIAGNOSIS — Z79899 Other long term (current) drug therapy: Secondary | ICD-10-CM

## 2017-10-04 DIAGNOSIS — I714 Abdominal aortic aneurysm, without rupture, unspecified: Secondary | ICD-10-CM

## 2017-10-04 DIAGNOSIS — I358 Other nonrheumatic aortic valve disorders: Secondary | ICD-10-CM

## 2017-10-04 DIAGNOSIS — I10 Essential (primary) hypertension: Secondary | ICD-10-CM | POA: Diagnosis not present

## 2017-10-04 LAB — URINALYSIS
Bilirubin, UA: NEGATIVE
Glucose, UA: NEGATIVE
KETONES UA: NEGATIVE
Leukocytes, UA: NEGATIVE
NITRITE UA: NEGATIVE
UUROB: 0.2 mg/dL (ref 0.2–1.0)
pH, UA: 5.5 (ref 5.0–7.5)

## 2017-10-04 LAB — BASIC METABOLIC PANEL
BUN/Creatinine Ratio: 13 (ref 9–20)
BUN: 14 mg/dL (ref 6–24)
CHLORIDE: 102 mmol/L (ref 96–106)
CO2: 25 mmol/L (ref 20–29)
CREATININE: 1.12 mg/dL (ref 0.76–1.27)
Calcium: 9.5 mg/dL (ref 8.7–10.2)
GFR calc Af Amer: 89 mL/min/{1.73_m2} (ref >59)
GFR calc non Af Amer: 77 mL/min/{1.73_m2} (ref >59)
GLUCOSE: 89 mg/dL (ref 65–99)
Potassium: 4.7 mmol/L (ref 3.5–5.2)
SODIUM: 142 mmol/L (ref 134–144)

## 2017-10-04 LAB — CBC
HEMOGLOBIN: 12.5 g/dL — AB (ref 13.0–17.7)
Hematocrit: 37.2 % — ABNORMAL LOW (ref 37.5–51.0)
MCH: 26.3 pg — ABNORMAL LOW (ref 26.6–33.0)
MCHC: 33.6 g/dL (ref 31.5–35.7)
MCV: 78 fL — ABNORMAL LOW (ref 79–97)
Platelets: 325 10*3/uL (ref 150–379)
RBC: 4.75 x10E6/uL (ref 4.14–5.80)
RDW: 14.9 % (ref 12.3–15.4)
WBC: 4.2 10*3/uL (ref 3.4–10.8)

## 2017-10-04 MED ORDER — AMIODARONE HCL 200 MG PO TABS: ORAL_TABLET | ORAL | 2 refills | Status: DC

## 2017-10-04 MED ORDER — TRAMADOL HCL 50 MG PO TABS: 50.0000 mg | ORAL_TABLET | Freq: Four times a day (QID) | ORAL | 0 refills | Status: DC | PRN

## 2017-10-04 MED ORDER — METOPROLOL TARTRATE 50 MG PO TABS: 50.0000 mg | ORAL_TABLET | Freq: Two times a day (BID) | ORAL | 2 refills | Status: DC

## 2017-10-04 MED ORDER — LISINOPRIL 10 MG PO TABS: 10.0000 mg | ORAL_TABLET | Freq: Every day | ORAL | 2 refills | Status: DC

## 2017-10-04 MED ORDER — APIXABAN 5 MG PO TABS: 5.0000 mg | ORAL_TABLET | Freq: Two times a day (BID) | ORAL | 2 refills | Status: DC

## 2017-10-04 NOTE — Patient Instructions (Signed)
If you need a refill on your cardiac medications before your next appointment, please call your pharmacy.  Labwork: UA, BMET AND CBC TODAY HERE IN OUR OFFICE AT [facility]  Follow-Up: Your physician wants you to follow-up in: KEEP SCHEDULED FOLLOW UP WITH DR CRENSHAW .   Thank you for choosing [facility]!!

## 2017-10-05 ENCOUNTER — Telehealth: Payer: Self-pay | Admitting: Adult Health

## 2017-10-05 NOTE — Telephone Encounter (Signed)
Notes recorded by Jodelle GrossLawrence, Kathryn M, NP on 10/04/2017 at 4:54 PM EDT Urinalysis was negative for infection but did reveal some evidence of blood. Please have him see PCP for further evaluation and referral if indicated, No evidence of anemia or renal insufficiency.

## 2017-10-05 NOTE — Telephone Encounter (Signed)
New message   Patient returning call for lab results. Nurse not available Please call

## 2017-10-06 NOTE — Telephone Encounter (Signed)
Called Patient .  Left detailed normal message for patient (DPR) TO CALL PCP TO DISCUSS BLOOD IN UA

## 2017-10-07 ENCOUNTER — Telehealth (HOSPITAL_COMMUNITY): Payer: Self-pay

## 2017-10-07 NOTE — Telephone Encounter (Signed)
Called patient to schedule Cardiac Rehab - Scheduled orientation on 12/08/17 at 8:15am. Patient will attend the 8:15am exc class on Mondays and Wednesdays. Mailed packet.

## 2017-10-10 ENCOUNTER — Other Ambulatory Visit: Payer: Self-pay | Admitting: Thoracic Surgery (Cardiothoracic Vascular Surgery)

## 2017-10-10 DIAGNOSIS — I712 Thoracic aortic aneurysm, without rupture, unspecified: Secondary | ICD-10-CM

## 2017-10-11 ENCOUNTER — Other Ambulatory Visit: Payer: Self-pay

## 2017-10-11 ENCOUNTER — Ambulatory Visit
Admission: RE | Admit: 2017-10-11 | Discharge: 2017-10-11 | Disposition: A | Discharge status: Home / Self Care | Payer: BLUE CROSS/BLUE SHIELD | Source: Ambulatory Visit | Attending: Thoracic Surgery (Cardiothoracic Vascular Surgery) | Admitting: Thoracic Surgery (Cardiothoracic Vascular Surgery)

## 2017-10-11 ENCOUNTER — Ambulatory Visit (INDEPENDENT_AMBULATORY_CARE_PROVIDER_SITE_OTHER): Payer: Self-pay | Admitting: Thoracic Surgery (Cardiothoracic Vascular Surgery)

## 2017-10-11 ENCOUNTER — Encounter: Payer: Self-pay | Admitting: Thoracic Surgery (Cardiothoracic Vascular Surgery)

## 2017-10-11 VITALS — BP 126/84 | HR 60 | Resp 16 | Ht 74.0 in | Wt 276.8 lb

## 2017-10-11 DIAGNOSIS — I712 Thoracic aortic aneurysm, without rupture, unspecified: Secondary | ICD-10-CM

## 2017-10-11 DIAGNOSIS — Q2549 Other congenital malformations of aorta: Secondary | ICD-10-CM

## 2017-10-11 DIAGNOSIS — Z09 Encounter for follow-up examination after completed treatment for conditions other than malignant neoplasm: Secondary | ICD-10-CM

## 2017-10-11 NOTE — Progress Notes (Signed)
[address]       [place] [id number]             [phone]     HPI: Matthew Hoffman returns for a scheduled postoperative follow-up visit  Matthew RichterFoster Hoffman is a 52 year old man with history of hypertension, arthritis, obstructive sleep apnea, diverticulitis, abdominal hernia repair, and BPH.  He recently developed hematuria and was evaluated by urology.  A CT of the abdomen and pelvis was done.  It did not show a cause for his hematuria but did show a sinus of Valsalva aneurysm.  A CT of the chest showed a 7.3 cm sinus of Valsalva aneurysm.  Beyond the sinotubular junction the aorta appeared normal.  Cardiac catheterization revealed no significant coronary disease.  I did a repair of the sinus of Valsalva aneurysm with reimplantation of the aortic valve on 09/08/2017.  His postoperative course was complicated by atrial flutter.  He had cardioversion for that on 09/15/2017.  He was discharged the following day.  Since his been home is been having some difficulty sleeping.  He does not feel comfortable lying flat and is not comfortable wearing CPAP while sitting up.  He is got a wedge under his back in the bed but says he tends to slide down on that over time.  He has some incisional discomfort but is only using narcotics at night before he goes to sleep.  He has not noted any irregular heart rhythms or palpitations.  Past Medical History:  Diagnosis Date  . Arthritis   . Diverticulosis   . Hiatal hernia    small per ct 2016  . Hypertension   . Hypertrophy of prostate    mild  . OSA on CPAP    per pt moderate osa per study  . Sinus of Valsalva aneurysm   . Wears glasses      Current Outpatient Medications  Medication Sig Dispense Refill  . acetaminophen (TYLENOL) 325 MG tablet Take 2 tablets (650 mg total) by mouth every 6 (six) hours as needed for mild pain or moderate pain.    Marland Kitchen. amiodarone (PACERONE) 200 MG tablet Please take 400mg  (2 tabs) twice a day for 2 days  then take 200mg  (1 tab) twice a day until we see you in follow-up. 70 tablet 2  . apixaban (ELIQUIS) 5 MG TABS tablet Take 1 tablet (5 mg total) by mouth 2 (two) times daily. 60 tablet 2  . aspirin EC 81 MG tablet Take 81 mg by mouth daily.    Marland Kitchen. EPINEPHrine (EPIPEN 2-PAK) 0.3 mg/0.3 mL IJ SOAJ injection Inject 0.3 mg into the muscle as needed (for allergic reaction).     Marland Kitchen. guaiFENesin (MUCINEX) 600 MG 12 hr tablet Take 600 mg by mouth 2 (two) times daily as needed for cough or to loosen phlegm.     Marland Kitchen. lisinopril (PRINIVIL,ZESTRIL) 10 MG tablet Take 1 tablet (10 mg total) by mouth daily. 30 tablet 2  . metoprolol tartrate (LOPRESSOR) 50 MG tablet Take 1 tablet (50 mg total) by mouth 2 (two) times daily. 60 tablet 2  . Multiple Vitamin (MULTIVITAMIN) tablet Take 1 tablet by mouth daily.    . traMADol (ULTRAM) 50 MG tablet Take 1 tablet (50 mg total) by mouth every 6 (six) hours as needed for moderate pain. Contact sergeon for future refills 30 tablet 0   No current facility-administered medications for this visit.     Physical Exam BP 126/84 (BP Location: Left Arm,  Patient Position: Sitting, Cuff Size: Small)   Pulse 60   Resp 16   Ht 6\' 2"  (1.88 m)   Wt 276 lb 12.8 oz (125.6 kg)   SpO2 96% Comment: RA  BMI 35.70 kg/m  52 year old man in no acute distress Alert and oriented x3 with no focal deficits Sternum stable Sternal incision with 8 x 4 mm wide open area at the top of the incision with granulation tissue.  No purulence. Cardiac regular rate and rhythm normal S1 and S2 with no rubs or murmurs No peripheral edema  Diagnostic Tests: CHEST - 2 VIEW  COMPARISON:  09/14/2017.  FINDINGS: Prior median sternotomy. Stable cardiomegaly. Thoracic aorta appears stable. No focal infiltrate. Low lung volumes. No pleural effusion or pneumothorax. No acute bony abnormality.  IMPRESSION: Prior median sternotomy. Stable cardiomegaly. Thoracic aorta appears stable. Low lung volumes. No  acute abnormality.   Electronically Signed   By: Maisie Fus  Register   On: 10/11/2017 12:14 I personally reviewed the chest x-ray images and concur with the findings noted above  Impression: Matthew Hoffman is a 52 year old man who underwent repair of a 7.3 cm sinus of Valsalva aneurysm with reimplantation of his native aortic valve on 09/08/2017.  He had atrial flutter in the postoperative period and required cardioversion on 09/15/2017.  His postoperative course was otherwise uncomplicated.  He has a small dehiscence at the top of his incision.  There is no evidence of infection.  He is going to clean that area with peroxide and keep covered with a dry dressing.  I will plan to see him back in about 2 weeks to check on that.  He is only using oxycodone at night.  I think he is safe to drive at this point.  I did caution him to maintain low speed and short trips for the next several weeks.  He is not to lift anything over 10 pounds for another 2 weeks.  Atrial flutter-in sinus rhythm today on amiodarone, metoprolol and Eliquis.  Being followed by cardiology for that.  Plan: I will plan to see him back in 2 weeks to check on his sternal incision.  He knows to call if he has any problems in the meantime.  Loreli Slot, MD [facility] [phone]

## 2017-10-11 NOTE — Addendum Note (Signed)
Addended by: Armen PickupWYSOR, Nemiah Kissner T on: 10/11/2017 03:36 PM   Modules accepted: Orders

## 2017-10-21 ENCOUNTER — Telehealth: Payer: Self-pay

## 2017-10-21 DIAGNOSIS — T8149XA Infection following a procedure, other surgical site, initial encounter: Secondary | ICD-10-CM

## 2017-10-21 MED ORDER — CEPHALEXIN 500 MG PO CAPS: 500.0000 mg | ORAL_CAPSULE | Freq: Three times a day (TID) | ORAL | 0 refills | Status: DC

## 2017-10-21 NOTE — Telephone Encounter (Signed)
RX for Keflex 50 mg TID x 10 days called to [facility]. Due to sternal incision infection. Patient is aware

## 2017-10-25 ENCOUNTER — Encounter: Payer: Self-pay | Admitting: Thoracic Surgery (Cardiothoracic Vascular Surgery)

## 2017-10-25 ENCOUNTER — Other Ambulatory Visit: Payer: Self-pay

## 2017-10-25 ENCOUNTER — Ambulatory Visit (INDEPENDENT_AMBULATORY_CARE_PROVIDER_SITE_OTHER): Payer: Self-pay | Admitting: Thoracic Surgery (Cardiothoracic Vascular Surgery)

## 2017-10-25 VITALS — BP 137/95 | HR 55 | Resp 16 | Ht 74.0 in | Wt 286.6 lb

## 2017-10-25 DIAGNOSIS — Q2549 Other congenital malformations of aorta: Secondary | ICD-10-CM

## 2017-10-25 DIAGNOSIS — Z09 Encounter for follow-up examination after completed treatment for conditions other than malignant neoplasm: Secondary | ICD-10-CM

## 2017-10-25 NOTE — Progress Notes (Signed)
[address]       [place] [address]             [phone]       HPI: Matthew Hoffman returns for follow-up of his sternal wound incision  Matthew Hoffman is a 52 year old man with a history of hypertension, arthritis, obstructive sleep apnea, abdominal hernia repair, and BPH.  He was being evaluated for hematuria.  A CT showed a sinus of Valsalva aneurysm.  CT of the chest confirmed a 7.3 cm sinus of Valsalva aneurysm.  I did repair of the sinus Valsalva aneurysm with reimplantation of his native aortic valve on 09/08/2017.  His postoperative course was complicated by atrial flutter.  He was cardioverted prior to discharge.  I saw him in the office on 10/12/2015.  He was still having some pain and was having difficulty finding a comfortable way to sleep.  There was a small dehiscence at the top of his incision but it did not appear infected.  He was cleaning with peroxide and covering with a dry dressing.  Last Friday he noticed that it was more red around the area and there was some exudate on the incision.  We started him on Keflex empirically.  He has noted significant improvement over the weekend.  Overall he is feeling better.  He is starting to feel more energetic.  He has been driving.  He has been back to work for short periods of time.  He is not taking any narcotics  Past Medical History:  Diagnosis Date  . Arthritis   . Diverticulosis   . Hiatal hernia    small per ct 2016  . Hypertension   . Hypertrophy of prostate    mild  . OSA on CPAP    per pt moderate osa per study  . Sinus of Valsalva aneurysm   . Wears glasses     Current Outpatient Medications  Medication Sig Dispense Refill  . acetaminophen (TYLENOL) 325 MG tablet Take 2 tablets (650 mg total) by mouth every 6 (six) hours as needed for mild pain or moderate pain.    Marland Kitchen amiodarone (PACERONE) 200 MG tablet Please take  (2 tabs) twice a day for 2 days then take  (1 tab) twice a day  until we see you in follow-up. 70 tablet 2  . apixaban (ELIQUIS) 5 MG TABS tablet Take 1 tablet (5 mg total) by mouth 2 (two) times daily. 60 tablet 2  . aspirin EC 81 MG tablet Take 81 mg by mouth daily.    Marland Kitchen EPINEPHrine (EPIPEN 2-PAK) 0.3 mg/0.3 mL IJ SOAJ injection Inject 0.3 mg into the muscle as needed (for allergic reaction).     Marland Kitchen guaiFENesin (MUCINEX) 600 MG 12 hr tablet Take 600 mg by mouth 2 (two) times daily as needed for cough or to loosen phlegm.     Marland Kitchen lisinopril (PRINIVIL,ZESTRIL) 10 MG tablet Take 1 tablet (10 mg total) by mouth daily. 30 tablet 2  . metoprolol tartrate (LOPRESSOR) 50 MG tablet Take 1 tablet (50 mg total) by mouth 2 (two) times daily. 60 tablet 2  . Multiple Vitamin (MULTIVITAMIN) tablet Take 1 tablet by mouth daily.    . traMADol (ULTRAM) 50 MG tablet Take 1 tablet (50 mg total) by mouth every 6 (six) hours as needed for moderate pain. Contact sergeon for future refills 30 tablet 0   No current facility-administered medications for this visit.     Physical Exam BP Marland Kitchen)  137/95 (BP Location: Right Arm, Patient Position: Sitting, Cuff Size: Large)   Pulse (!) 55   Resp 16   Ht  (1.88 m)   Wt 286 lb 9.6 oz (130 kg) Comment: clothes, keys, etc.  SpO2 98% Comment: ON RA  BMI 36.72 kg/m  52 year old man in no acute distress Sternum stable Incision with 3 x 7 mm open area with granulation tissue.  No exudate or slough.  No surrounding erythema or induration.  Impression: 52 year old man who is now about 7 weeks out from repair of a sinus of Valsalva aneurysm.  He is doing well overall.  He had some atrial fibrillation flutter postoperatively.  He currently is in sinus bradycardia.  He did have dehiscence of the upper portion of the sternal wound.  That appeared to possibly be infected on a picture that he texted to [place] on Friday.  We will start him empirically on Keflex.  Today the wound is healthy-appearing and healing well.  Once the eschar was peeled off  underneath there is 100% granulation.  I think at this point it is best just to use a saline wet-to-dry gauze.  Plan: Normal saline wet-to-dry dressing changes  Complete course of Keflex  Return in 3 weeks for wound check  Loreli Slot, MD [facility] [phone]

## 2017-11-11 ENCOUNTER — Other Ambulatory Visit: Payer: Self-pay | Admitting: Physician Assistant

## 2017-11-14 ENCOUNTER — Other Ambulatory Visit: Payer: Self-pay | Admitting: Physician Assistant

## 2017-11-15 ENCOUNTER — Other Ambulatory Visit: Payer: Self-pay | Admitting: Cardiology

## 2017-11-15 ENCOUNTER — Encounter: Payer: Self-pay | Admitting: Thoracic Surgery (Cardiothoracic Vascular Surgery)

## 2017-11-15 ENCOUNTER — Ambulatory Visit (INDEPENDENT_AMBULATORY_CARE_PROVIDER_SITE_OTHER): Payer: Self-pay | Admitting: Thoracic Surgery (Cardiothoracic Vascular Surgery)

## 2017-11-15 ENCOUNTER — Other Ambulatory Visit: Payer: Self-pay

## 2017-11-15 VITALS — BP 152/99 | HR 58 | Resp 16 | Ht 74.0 in | Wt 287.0 lb

## 2017-11-15 DIAGNOSIS — Z09 Encounter for follow-up examination after completed treatment for conditions other than malignant neoplasm: Secondary | ICD-10-CM

## 2017-11-15 DIAGNOSIS — T8149XA Infection following a procedure, other surgical site, initial encounter: Secondary | ICD-10-CM

## 2017-11-15 DIAGNOSIS — Z736 Limitation of activities due to disability: Secondary | ICD-10-CM

## 2017-11-15 DIAGNOSIS — Q2549 Other congenital malformations of aorta: Secondary | ICD-10-CM

## 2017-11-15 NOTE — Progress Notes (Signed)
[address]       Matthew Hoffman [address]             [phone]     HPI: Matthew Hoffman returns for a scheduled follow up visit  Matthew Hoffman is a 52 year old man with a history of hypertension, obstructive sleep apnea, arthritis, and a previous abdominal hernia repair.  He was being evaluated for hematuria.  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  I did repair of the aneurysm with reimplantation of his native aortic valve on 09/08/2017.  He had atrial flutter postoperatively.  He was cardioverted to sinus rhythm prior to discharge.  He did go home on Eliquis.  He developed a small superficial sternal wound dehiscence about a month ago.  It was not infected initially and we just treated with local wound care.  He did develop some redness in mid April and was treated with Keflex.  I saw him in the office on 10/25/2017 and the wound was clean and granulating.  He is been feeling well.  He says he notices some discomfort at the lower part of his incision after walking about a mile.  He feels well overall.  He is back at work.  Past Medical History:  Diagnosis Date  . Arthritis   . Diverticulosis   . Hiatal hernia    small per ct 2016  . Hypertension   . Hypertrophy of prostate    mild  . OSA on CPAP    per pt moderate osa per study  . Sinus of Valsalva aneurysm   . Wears glasses     Current Outpatient Medications  Medication Sig Dispense Refill  . acetaminophen (TYLENOL) 325 MG tablet Take 2 tablets (650 mg total) by mouth every 6 (six) hours as needed for mild pain or moderate pain.    Marland Kitchen amiodarone (PACERONE) 200 MG tablet Please take  (2 tabs) twice a day for 2 days then take  (1 tab) twice a day until we see you in follow-up. 70 tablet 2  . apixaban (ELIQUIS) 5 MG TABS tablet Take 1 tablet (5 mg total) by mouth 2 (two) times daily. 60 tablet 2  . aspirin EC 81 MG  tablet Take 81 mg by mouth daily.    Marland Kitchen EPINEPHrine (EPIPEN 2-PAK) 0.3 mg/0.3 mL IJ SOAJ injection Inject 0.3 mg into the muscle as needed (for allergic reaction).     Marland Kitchen guaiFENesin (MUCINEX) 600 MG 12 hr tablet Take 600 mg by mouth 2 (two) times daily as needed for cough or to loosen phlegm.     Marland Kitchen lisinopril (PRINIVIL,ZESTRIL) 10 MG tablet Take 1 tablet (10 mg total) by mouth daily. 30 tablet 2  . metoprolol tartrate (LOPRESSOR) 50 MG tablet Take 1 tablet (50 mg total) by mouth 2 (two) times daily. 60 tablet 2  . Multiple Vitamin (MULTIVITAMIN) tablet Take 1 tablet by mouth daily.     No current facility-administered medications for this visit.     Physical Exam BP (!) 152/99 (BP Location: Right Arm, Patient Position: Sitting, Cuff Size: Large)   Pulse (!) 58   Resp 16   Ht  (1.88 m)   Wt 287 lb (130.2 kg)   SpO2 96% Comment: ON RA  BMI 36.11 kg/m  52 year old man in no acute distress Alert and oriented x2 no focal deficits Sternum stable Sternal incision completely  healed with a 2 mm eschar at the top of the incision. Cardiac regular rate and rhythm normal S1 and S2 no audible murmur on exam today.  Impression: Matthew Hoffman is a 52 year old man who underwent aortic root replacement with reimplantation of his native aortic valve about 2 months ago.  He had atrial flutter postoperatively.  He was cardioverted prior to discharge.  He was in sinus rhythm at the time of discharge.  He does remain on Eliquis.  He will follow-up with Dr. Jens Som regarding that issue.  He developed a superficial wound dehiscence.  That has now healed.  The sternum itself is stable.  There are no restrictions on his activities at this time he was cautioned to build into new activities gradually.  Plan: Return in 2 months with PA and lateral chest x-ray  Loreli Slot, MD [facility] [phone]

## 2017-11-18 ENCOUNTER — Telehealth: Payer: Self-pay | Admitting: Adult Health

## 2017-11-18 MED ORDER — METOPROLOL TARTRATE 50 MG PO TABS: 50.0000 mg | ORAL_TABLET | Freq: Two times a day (BID) | ORAL | 1 refills | Status: DC

## 2017-11-18 MED ORDER — AMIODARONE HCL 200 MG PO TABS: ORAL_TABLET | ORAL | 1 refills | Status: DC

## 2017-11-18 NOTE — Telephone Encounter (Signed)
New Message   Pt c/o medication issue:  1. Name of Medication: amiodarone (PACERONE) 200 MG tablet and metoprolol tartrate (LOPRESSOR) 50 MG tablet  2. How are you currently taking this medication (dosage and times per day)? Please take  (2 tabs) twice a day for 2 days then take  (1 tab) twice a day until we see you in follow-up. And Take 1 tablet (50 mg total) by mouth 2 (two) times daily.  3. Are you having a reaction (difficulty breathing--STAT)? no  4. What is your medication issue? Pt states that the medications listed were stopped and he wants to know why. Pt says he is going out of town and he needs his medication. Please call

## 2017-11-18 NOTE — Telephone Encounter (Signed)
Spoke with pt who states he tried to pick up his refill for amiodarone 200 mg and metoprolol tartrate 50 mg but was told refill was denied. [facility] in [place] was contact and per pharmacist they never received refills on 10/04/17. New script sent until pt see's Dr. Jens Som on 12/15/17. Pt updated.

## 2017-11-29 ENCOUNTER — Telehealth (HOSPITAL_COMMUNITY): Payer: Self-pay | Admitting: Pharmacist

## 2017-11-29 NOTE — Telephone Encounter (Signed)
Cardiac Rehab Medication Review by a Pharmacist  Does the patient  feel that his/her medications are working for him/her?  yes  Has the patient been experiencing any side effects to the medications prescribed?  No, seems to be tolerating medications well.  Does the patient measure his/her own blood pressure or blood glucose at home?  Yes, BP is around 130/90s.  Tolerating beta-blocker well with little to no fatigue.  Does the patient have any problems obtaining medications due to transportation or finances?   no  Understanding of regimen: excellent Understanding of indications: excellent Potential of compliance: excellent   Pharmacist comments: Patient states that doctor may discontinue eliquis and amiodarone if next check up goes well. No other questions or concerns, and is ready for his appointment.  Donnella Biyler Shai Mckenzie, PharmD PGY1 Acute Care Pharmacy Resident 11/29/2017 3:58 PM

## 2017-12-08 ENCOUNTER — Encounter (HOSPITAL_COMMUNITY)
Admission: RE | Admit: 2017-12-08 | Discharge: 2017-12-08 | Disposition: A | Discharge status: Home / Self Care | Payer: BLUE CROSS/BLUE SHIELD | Source: Ambulatory Visit | Attending: Cardiology | Admitting: Cardiology

## 2017-12-08 ENCOUNTER — Encounter (HOSPITAL_COMMUNITY): Payer: Self-pay

## 2017-12-08 VITALS — Ht 74.0 in | Wt 288.6 lb

## 2017-12-08 DIAGNOSIS — Z79899 Other long term (current) drug therapy: Secondary | ICD-10-CM | POA: Insufficient documentation

## 2017-12-08 DIAGNOSIS — Z9889 Other specified postprocedural states: Secondary | ICD-10-CM | POA: Diagnosis not present

## 2017-12-08 DIAGNOSIS — G4733 Obstructive sleep apnea (adult) (pediatric): Secondary | ICD-10-CM | POA: Insufficient documentation

## 2017-12-08 DIAGNOSIS — Z7982 Long term (current) use of aspirin: Secondary | ICD-10-CM | POA: Diagnosis not present

## 2017-12-08 DIAGNOSIS — Z7901 Long term (current) use of anticoagulants: Secondary | ICD-10-CM | POA: Insufficient documentation

## 2017-12-08 DIAGNOSIS — Z87891 Personal history of nicotine dependence: Secondary | ICD-10-CM | POA: Insufficient documentation

## 2017-12-08 DIAGNOSIS — I10 Essential (primary) hypertension: Secondary | ICD-10-CM | POA: Diagnosis not present

## 2017-12-08 DIAGNOSIS — Q2549 Other congenital malformations of aorta: Secondary | ICD-10-CM | POA: Diagnosis present

## 2017-12-08 DIAGNOSIS — Z8679 Personal history of other diseases of the circulatory system: Secondary | ICD-10-CM

## 2017-12-08 NOTE — Progress Notes (Signed)
Cardiac Individual Treatment Plan  Patient Details  Name: Matthew Hoffman MRN: [id number] Date of Birth: 08/16/1965 Referring Provider:   Flowsheet Row CARDIAC REHAB PHASE [facility]  Referring Provider  Lewayne Buntingrenshaw, Brian S MD       Initial Encounter Date:  Flowsheet Row CARDIAC REHAB PHASE II [facility]ATION [facility]  Date  12/08/17  Referring Provider  Lewayne Buntingrenshaw, Brian S MD       Visit Diagnosis: Aneurysm of sinus of Valsalva  S/P thoracic aortic aneurysm repair  Patient's Home Medications on Admission:  Current Outpatient Medications:  .  acetaminophen (TYLENOL) 325 MG tablet, Take 2 tablets (650 mg total) by mouth every 6 (six) hours as needed for mild pain or moderate pain., Disp: , Rfl:  .  amiodarone (PACERONE) 200 MG tablet, Take 1 tablet twice a day until we see you in follow-up., Disp: 60 tablet, Rfl: 1 .  aspirin EC 81 MG tablet, Take 81 mg by mouth daily., Disp: , Rfl:  .  ELIQUIS 5 MG TABS tablet, TAKE 1 TABLET BY MOUTH TWICE DAILY, Disp: 60 tablet, Rfl: 1 .  EPINEPHrine (EPIPEN 2-PAK) 0.3 mg/0.3 mL IJ SOAJ injection, Inject 0.3 mg into the muscle as needed (for allergic reaction). , Disp: , Rfl:  .  guaiFENesin (MUCINEX) 600 MG 12 hr tablet, Take 600 mg by mouth 2 (two) times daily as needed for cough or to loosen phlegm. , Disp: , Rfl:  .  lisinopril (PRINIVIL,ZESTRIL) 10 MG tablet, Take 1 tablet (10 mg total) by mouth daily., Disp: 30 tablet, Rfl: 2 .  metoprolol tartrate (LOPRESSOR) 50 MG tablet, Take 1 tablet (50 mg total) by mouth 2 (two) times daily., Disp: 60 tablet, Rfl: 1 .  Multiple Vitamin (MULTIVITAMIN) tablet, Take 1 tablet by mouth daily., Disp: , Rfl:   Past Medical History: Past Medical History:  Diagnosis Date  . Arthritis   . Diverticulosis   . Hiatal hernia    small per ct 2016  . Hypertension   . Hypertrophy of prostate    mild  .  OSA on CPAP    per pt moderate osa per study  . Sinus of Valsalva aneurysm   . Wears glasses     Tobacco Use: Social History   Tobacco Use  Smoking Status Former Smoker  . Years: 0.00  . Types: Cigars  Smokeless Tobacco Never Used  Tobacco Comment   average cigar 2 per month    Labs: Recent Review Flowsheet Data    Labs for ITP Cardiac and Pulmonary Rehab Latest Ref Rng & Units 09/08/2017 09/08/2017 09/08/2017 09/08/2017 09/09/2017   Hemoglobin A1c 4.8 - 5.6 % - - - - -   PHART 7.350 - 7.450 7.308(L) 7.331(L) 7.329(L) - -   PCO2ART 32.0 - 48.0 mmHg 42.1 40.6 36.8 - -   HCO3 20.0 - 28.0 mmol/L 21.4 21.4 19.2(L) - -   TCO2 22 - 32 mmol/L 23 23 20(L) 20(L) 23   ACIDBASEDEF 0.0 - 2.0 mmol/L 5.0(H) 4.0(H) 6.0(H) - -   O2SAT % 99.0 99.0 99.0 - -      Capillary Blood Glucose: Lab Results  Component Value Date   GLUCAP 110 (H) 09/12/2017   GLUCAP 110 (H) 09/12/2017   GLUCAP 105 (H) 09/11/2017   GLUCAP 129 (H) 09/11/2017   GLUCAP 117 (H) 09/11/2017     Exercise Target Goals: Date: 12/08/17  Exercise Program Goal: Individual  exercise prescription set using results from initial 6 min walk test and THRR while considering  patient's activity barriers and safety.   Exercise Prescription Goal: Initial exercise prescription builds to 30-45 minutes a day of aerobic activity, 2-3 days per week.  Home exercise guidelines will be given to patient during program as part of exercise prescription that the participant will acknowledge.  Activity Barriers & Risk Stratification: Activity Barriers & Cardiac Risk Stratification - 12/08/17 0835    Activity Barriers & Cardiac Risk Stratification          Activity Barriers  Incisional Pain;None    Cardiac Risk Stratification  High           6 Minute Walk: 6 Minute Walk    6 Minute Walk    Row Name 12/08/17 1015   Phase  Initial   Distance  1600 feet   Walk Time  6 minutes   # of Rest Breaks  0   MPH  3.03   METS  3.69   RPE  11    Perceived Dyspnea   0   VO2 Peak  12.9   Symptoms  No   Resting HR  49 bpm   Resting BP  128/78   Resting Oxygen Saturation   97 %   Exercise Oxygen Saturation  during 6 min walk  98 %   Max Ex. HR  64 bpm   Max Ex. BP  140/82   2 Minute Post BP  122/84          Oxygen Initial Assessment:   Oxygen Re-Evaluation:   Oxygen Discharge (Final Oxygen Re-Evaluation):   Initial Exercise Prescription: Initial Exercise Prescription - 12/08/17 1000    Date of Initial Exercise RX and Referring Provider          Date  12/08/17    Referring Provider  Lewayne Bunting MD         Treadmill          MPH  3    Grade  0    Minutes  10    METs  3.3        Bike          Level  1.5    Minutes  10    METs  3.18        NuStep          Level  3    SPM  85    Minutes  10    METs  3        Prescription Details          Frequency (times per week)  3x    Duration  Progress to 30 minutes of continuous aerobic without signs/symptoms of physical distress        Intensity          THRR 40-80% of Max Heartrate  68-137    Ratings of Perceived Exertion  11-13    Perceived Dyspnea  0-4        Progression          Progression  Continue progressive overload as per policy without signs/symptoms or physical distress.        Resistance Training          Training Prescription  Yes    Weight  5lbs    Reps  10-15           Perform Capillary Blood Glucose checks as needed.  Exercise Prescription Changes:  Exercise Comments:   Exercise Goals and Review: Exercise Goals    Exercise Goals    Row Name 12/08/17 1018   Increase Physical Activity  Yes   Intervention  Provide advice, education, support and counseling about physical activity/exercise needs.;Develop an individualized exercise prescription for aerobic and resistive training based on initial evaluation findings, risk stratification, comorbidities and participant's personal goals.   Expected Outcomes  Short  Term: Attend rehab on a regular basis to increase amount of physical activity.;Long Term: Add in home exercise to make exercise part of routine and to increase amount of physical activity.;Long Term: Exercising regularly at least 3-5 days a week.   Increase Strength and Stamina  Yes   Intervention  Provide advice, education, support and counseling about physical activity/exercise needs.;Develop an individualized exercise prescription for aerobic and resistive training based on initial evaluation findings, risk stratification, comorbidities and participant's personal goals.   Expected Outcomes  Short Term: Increase workloads from initial exercise prescription for resistance, speed, and METs.;Short Term: Perform resistance training exercises routinely during rehab and add in resistance training at home;Long Term: Improve cardiorespiratory fitness, muscular endurance and strength as measured by increased METs and functional capacity ( )   Able to understand and use rate of perceived exertion (RPE) scale  Yes   Intervention  Provide education and explanation on how to use RPE scale   Expected Outcomes  Short Term: Able to use RPE daily in rehab to express subjective intensity level;Long Term:  Able to use RPE to guide intensity level when exercising independently   Knowledge and understanding of Target Heart Rate Range (THRR)  Yes   Intervention  Provide education and explanation of THRR including how the numbers were predicted and where they are located for reference   Expected Outcomes  Short Term: Able to state/look up THRR;Short Term: Able to use daily as guideline for intensity in rehab;Long Term: Able to use THRR to govern intensity when exercising independently   Able to check pulse independently  Yes   Intervention  Provide education and demonstration on how to check pulse in carotid and radial arteries.;Review the importance of being able to check your own pulse for safety during independent  exercise   Expected Outcomes  Short Term: Able to explain why pulse checking is important during independent exercise;Long Term: Able to check pulse independently and accurately   Understanding of Exercise Prescription  Yes   Intervention  Provide education, explanation, and written materials on patient's individual exercise prescription   Expected Outcomes  Short Term: Able to explain program exercise prescription;Long Term: Able to explain home exercise prescription to exercise independently          Exercise Goals Re-Evaluation :    Discharge Exercise Prescription (Final Exercise Prescription Changes):   Nutrition:  Target Goals: Understanding of nutrition guidelines, daily intake of sodium 1500mg , cholesterol 200mg , calories 30% from fat and 7% or less from saturated fats, daily to have 5 or more servings of fruits and vegetables.  Biometrics: Pre Biometrics - 12/08/17 1019    Pre Biometrics          Height  6\' 2"  (1.88 m)    Weight  288 lb 9.3 oz (130.9 kg)    Waist Circumference  49 inches    Hip Circumference  49.5 inches    Waist to Hip Ratio  0.99 %    BMI (Calculated)  37.04    Triceps Skinfold  10 mm    % Body Fat  32.2 %  Grip Strength  52 kg    Flexibility  13 in    Single Leg Stand  13.12 seconds            Nutrition Therapy Plan and Nutrition Goals:   Nutrition Assessments:   Nutrition Goals Re-Evaluation:   Nutrition Goals Re-Evaluation:   Nutrition Goals Discharge (Final Nutrition Goals Re-Evaluation):   Psychosocial: Target Goals: Acknowledge presence or absence of significant depression and/or stress, maximize coping skills, provide positive support system. Participant is able to verbalize types and ability to use techniques and skills needed for reducing stress and depression.  Initial Review & Psychosocial Screening: Initial Psych Review & Screening - 12/08/17 1054    Initial Review          Current issues with  None Identified         Family Dynamics          Good Support System?  Yes children and family    Comments  pt exhibit positive outlook with good coping skills.         Barriers          Psychosocial barriers to participate in program  There are no identifiable barriers or psychosocial needs.        Screening Interventions          Interventions  Encouraged to exercise           Quality of Life Scores: Quality of Life - 12/08/17 1021    Quality of Life Scores          Health/Function Pre  24.87 %    Socioeconomic Pre  27.14 %    Psych/Spiritual Pre  29.64 %    Family Pre  26.38 %    GLOBAL Pre  26.55 %          Scores of 19 and below usually indicate a poorer quality of life in these areas.  A difference of  2-3 points is a clinically meaningful difference.  A difference of 2-3 points in the total score of the Quality of Life Index has been associated with significant improvement in overall quality of life, self-image, physical symptoms, and general health in studies assessing change in quality of life.  PHQ-9: Recent Review Flowsheet Data    There is no flowsheet data to display.     Interpretation of Total Score  Total Score Depression Severity:  1-4 = Minimal depression, 5-9 = Mild depression, 10-14 = Moderate depression, 15-19 = Moderately severe depression, 20-27 = Severe depression   Psychosocial Evaluation and Intervention:   Psychosocial Re-Evaluation:   Psychosocial Discharge (Final Psychosocial Re-Evaluation):   Vocational Rehabilitation: Provide vocational rehab assistance to qualifying candidates.   Vocational Rehab Evaluation & Intervention: Vocational Rehab - 12/08/17 1052    Initial Vocational Rehab Evaluation & Intervention          Assessment shows need for Vocational Rehabilitation  No counselor            Education: Education Goals: Education classes will be provided on a weekly basis, covering required topics. Participant will state  understanding/return demonstration of topics presented.  Learning Barriers/Preferences: Learning Barriers/Preferences - 12/08/17 0834    Learning Barriers/Preferences          Learning Barriers  Sight    Learning Preferences  Verbal Instruction;Skilled Demonstration;Written Material;Individual Instruction           Education Topics: Count Your Pulse:  -Group instruction provided by verbal instruction, demonstration, patient participation and written materials to  support subject.  Instructors address importance of being able to find your pulse and how to count your pulse when at home without a heart monitor.  Patients get hands on experience counting their pulse with staff help and individually.   Heart Attack, Angina, and Risk Factor Modification:  -Group instruction provided by verbal instruction, video, and written materials to support subject.  Instructors address signs and symptoms of angina and heart attacks.    Also discuss risk factors for heart disease and how to make changes to improve heart health risk factors.   Functional Fitness:  -Group instruction provided by verbal instruction, demonstration, patient participation, and written materials to support subject.  Instructors address safety measures for doing things around the house.  Discuss how to get up and down off the floor, how to pick things up properly, how to safely get out of a chair without assistance, and balance training.   Meditation and Mindfulness:  -Group instruction provided by verbal instruction, patient participation, and written materials to support subject.  Instructor addresses importance of mindfulness and meditation practice to help reduce stress and improve awareness.  Instructor also leads participants through a meditation exercise.    Stretching for Flexibility and Mobility:  -Group instruction provided by verbal instruction, patient participation, and written materials to support subject.   Instructors lead participants through series of stretches that are designed to increase flexibility thus improving mobility.  These stretches are additional exercise for major muscle groups that are typically performed during regular warm up and cool down.   Hands Only CPR:  -Group verbal, video, and participation provides a basic overview of AHA guidelines for community CPR. Role-play of emergencies allow participants the opportunity to practice calling for help and chest compression technique with discussion of AED use.   Hypertension: -Group verbal and written instruction that provides a basic overview of hypertension including the most recent diagnostic guidelines, risk factor reduction with self-care instructions and medication management.    Nutrition I class: Heart Healthy Eating:  -Group instruction provided by PowerPoint slides, verbal discussion, and written materials to support subject matter. The instructor gives an explanation and review of the Therapeutic Lifestyle Changes diet recommendations, which includes a discussion on lipid goals, dietary fat, sodium, fiber, plant stanol/sterol esters, sugar, and the components of a well-balanced, healthy diet.   Nutrition II class: Lifestyle Skills:  -Group instruction provided by PowerPoint slides, verbal discussion, and written materials to support subject matter. The instructor gives an explanation and review of label reading, grocery shopping for heart health, heart healthy recipe modifications, and ways to make healthier choices when eating out.   Diabetes Question & Answer:  -Group instruction provided by PowerPoint slides, verbal discussion, and written materials to support subject matter. The instructor gives an explanation and review of diabetes co-morbidities, pre- and post-prandial blood glucose goals, pre-exercise blood glucose goals, signs, symptoms, and treatment of hypoglycemia and hyperglycemia, and foot care  basics.   Diabetes Blitz:  -Group instruction provided by PowerPoint slides, verbal discussion, and written materials to support subject matter. The instructor gives an explanation and review of the physiology behind type 1 and type 2 diabetes, diabetes medications and rational behind using different medications, pre- and post-prandial blood glucose recommendations and Hemoglobin A1c goals, diabetes diet, and exercise including blood glucose guidelines for exercising safely.    Portion Distortion:  -Group instruction provided by PowerPoint slides, verbal discussion, written materials, and food models to support subject matter. The instructor gives an explanation of serving size  versus portion size, changes in portions sizes over the last 20 years, and what consists of a serving from each food group.   Stress Management:  -Group instruction provided by verbal instruction, video, and written materials to support subject matter.  Instructors review role of stress in heart disease and how to cope with stress positively.     Exercising on Your Own:  -Group instruction provided by verbal instruction, power point, and written materials to support subject.  Instructors discuss benefits of exercise, components of exercise, frequency and intensity of exercise, and end points for exercise.  Also discuss use of nitroglycerin and activating EMS.  Review options of places to exercise outside of rehab.  Review guidelines for sex with heart disease.   Cardiac Drugs I:  -Group instruction provided by verbal instruction and written materials to support subject.  Instructor reviews cardiac drug classes: antiplatelets, anticoagulants, beta blockers, and statins.  Instructor discusses reasons, side effects, and lifestyle considerations for each drug class.   Cardiac Drugs II:  -Group instruction provided by verbal instruction and written materials to support subject.  Instructor reviews cardiac drug classes:  angiotensin converting enzyme inhibitors (ACE-I), angiotensin II receptor blockers (ARBs), nitrates, and calcium channel blockers.  Instructor discusses reasons, side effects, and lifestyle considerations for each drug class.   Anatomy and Physiology of the Circulatory System:  Group verbal and written instruction and models provide basic cardiac anatomy and physiology, with the coronary electrical and arterial systems. Review of: AMI, Angina, Valve disease, Heart Failure, Peripheral Artery Disease, Cardiac Arrhythmia, Pacemakers, and the ICD.   Other Education:  -Group or individual verbal, written, or video instructions that support the educational goals of the cardiac rehab program.   Holiday Eating Survival Tips:  -Group instruction provided by PowerPoint slides, verbal discussion, and written materials to support subject matter. The instructor gives patients tips, tricks, and techniques to help them not only survive but enjoy the holidays despite the onslaught of food that accompanies the holidays.   Knowledge Questionnaire Score: Knowledge Questionnaire Score - 12/08/17 1020    Knowledge Questionnaire Score          Pre Score  20/24           Core Components/Risk Factors/Patient Goals at Admission: Personal Goals and Risk Factors at Admission - 12/08/17 1000    Core Components/Risk Factors/Patient Goals on Admission           Weight Management  Yes;Weight Loss    Intervention  Weight Management: Develop a combined nutrition and exercise program designed to reach desired caloric intake, while maintaining appropriate intake of nutrient and fiber, sodium and fats, and appropriate energy expenditure required for the weight goal.;Weight Management/Obesity: Establish reasonable short term and long term weight goals.;Weight Management: Provide education and appropriate resources to help participant work on and attain dietary goals.    Admit Weight  288 lb 9.3 oz (130.9 kg)    Goal  Weight: Short Term  285 lb (129.3 kg)    Goal Weight: Long Term  278 lb (126.1 kg)    Expected Outcomes  Short Term: Continue to assess and modify interventions until short term weight is achieved;Long Term: Adherence to nutrition and physical activity/exercise program aimed toward attainment of established weight goal;Weight Loss: Understanding of general recommendations for a balanced deficit meal plan, which promotes 1-2 lb weight loss per week and includes a negative energy balance of [phone] kcal/d;Understanding recommendations for meals to include 15-35% energy as protein, 25-35% energy from fat, 35-60%  energy from carbohydrates, less than 200mg  of dietary cholesterol, 20-35 gm of total fiber daily;Understanding of distribution of calorie intake throughout the day with the consumption of 4-5 meals/snacks    Hypertension  Yes    Intervention  Provide education on lifestyle modifcations including regular physical activity/exercise, weight management, moderate sodium restriction and increased consumption of fresh fruit, vegetables, and low fat dairy, alcohol moderation, and smoking cessation.;Monitor prescription use compliance.    Expected Outcomes  Long Term: Maintenance of blood pressure at goal levels.;Short Term: Continued assessment and intervention until BP is < 140/4mm HG in hypertensive participants. < 130/31mm HG in hypertensive participants with diabetes, heart failure or chronic kidney disease.    Stress  Yes    Intervention  Offer individual and/or small group education and counseling on adjustment to heart disease, stress management and health-related lifestyle change. Teach and support self-help strategies.;Refer participants experiencing significant psychosocial distress to appropriate mental health specialists for further evaluation and treatment. When possible, include family members and significant others in education/counseling sessions.    Expected Outcomes  Short Term: Participant  demonstrates changes in health-related behavior, relaxation and other stress management skills, ability to obtain effective social support, and compliance with psychotropic medications if prescribed.;Long Term: Emotional wellbeing is indicated by absence of clinically significant psychosocial distress or social isolation.           Core Components/Risk Factors/Patient Goals Review:    Core Components/Risk Factors/Patient Goals at Discharge (Final Review):    ITP Comments: ITP Comments    Row Name 12/08/17 0818 12/08/17 0831   ITP Comments  Dr. Armanda Magic, Medical Director   Dr. Armanda Magic, Medical Director       Comments: Patient attended orientation from [phone] to [phone]  to review rules and guidelines for program. Completed 6 minute walk test, Intitial ITP, and exercise prescription.  VSS. Telemetry-sinus bradycardia,   Asymptomatic.   Deveron Furlong, RN, BSN Cardiac Pulmonary Rehab 12/08/17 10:56 AM

## 2017-12-08 NOTE — Progress Notes (Signed)
Matthew Hoffman 52 y.o. male DOB: 03/28/1966 MRN: [id number]        Nutrition Note  1. Aneurysm of sinus of Valsalva   2. S/P thoracic aortic aneurysm repair    Past Medical History:  Diagnosis Date  . Arthritis   . Diverticulosis   . Hiatal hernia    small per ct 2016  . Hypertension   . Hypertrophy of prostate    mild  . OSA on CPAP    per pt moderate osa per study  . Sinus of Valsalva aneurysm   . Wears glasses    Meds reviewed. Lisinopril, lopressor noted  HT: Ht Readings from Last 1 Encounters:  12/08/17 6\' 2"  (1.88 m)    WT: Wt Readings from Last 5 Encounters:  12/08/17 288 lb 9.3 oz (130.9 kg)  11/15/17 287 lb (130.2 kg)  10/25/17 286 lb 9.6 oz (130 kg)  10/11/17 276 lb 12.8 oz (125.6 kg)  10/04/17 277 lb 12.8 oz (126 kg)     Body mass index is 37.05 kg/m.   Current tobacco use? No   Labs:  Lipid Panel  No results found for: CHOL, TRIG, HDL, CHOLHDL, VLDL, LDLCALC, LDLDIRECT  Lab Results  Component Value Date   HGBA1C 5.8 (H) 09/06/2017   CBG (last 3)  No results for input(s): GLUCAP in the last 72 hours.  Nutrition Note Spoke with pt. Nutrition plan and goals reviewed with pt. Pt is following Step 1 of the Therapeutic Lifestyle Changes diet. Pt wants to lose wt. Pt has been trying to lose wt by choosing heart healthy foods, using portion control, and staying physically active. Wt loss tips reviewed.   Per discussion, pt does not use canned/convenience foods often. Pt rarely adds salt to food. Pt eats out infrequently. Prefers to cook his own food at home.    Pt is noted to be prediabetic. Last A1c 5.8, Will follow up with patient and discuss at next session .  Pt expressed understanding of the information reviewed. Pt aware of nutrition education classes offered and he plans to attend nutrition classes  Nutrition Diagnosis ? Obesity related to excessive energy intake as evidenced by a Body mass index is 37.05 kg/m.  Nutrition  Intervention ? Pt's individual nutrition plan and goals reviewed with pt.  Nutrition Goal(s):  ? Pt to identify and limit food sources of trans fat, and sodium ? Pt to identify food quantities necessary to achieve weight loss of 6-10 lb at graduation from cardiac rehab. Goal wt of 278 lb desired.   Plan:  Pt to attend nutrition classes ? Nutrition I ? Nutrition II ? Portion Distortion  Will provide client-centered nutrition education as part of interdisciplinary care.   Monitor and evaluate progress toward nutrition goal with team.  Matthew MarcusAubrey Burklin, MS, RD, LDN 12/08/2017 11:16 AM

## 2017-12-12 ENCOUNTER — Encounter (HOSPITAL_COMMUNITY)
Admission: RE | Admit: 2017-12-12 | Discharge: 2017-12-12 | Disposition: A | Discharge status: Home / Self Care | Payer: BLUE CROSS/BLUE SHIELD | Source: Ambulatory Visit | Attending: Cardiology | Admitting: Cardiology

## 2017-12-12 ENCOUNTER — Encounter (HOSPITAL_COMMUNITY): Payer: BLUE CROSS/BLUE SHIELD

## 2017-12-12 ENCOUNTER — Encounter (HOSPITAL_COMMUNITY): Payer: Self-pay

## 2017-12-12 DIAGNOSIS — Z9889 Other specified postprocedural states: Secondary | ICD-10-CM

## 2017-12-12 DIAGNOSIS — Q2549 Other congenital malformations of aorta: Secondary | ICD-10-CM | POA: Diagnosis not present

## 2017-12-12 DIAGNOSIS — Z8679 Personal history of other diseases of the circulatory system: Secondary | ICD-10-CM

## 2017-12-12 NOTE — Progress Notes (Signed)
Daily Session Note  Patient Details  Name: Matthew Hoffman MRN: [id number] Date of Birth: Sep 02, 1965 Referring Provider:   Flowsheet Row CARDIAC REHAB PHASE II ORIENTATION from 12/08/2017 in [place]  Referring Provider  Lelon Perla MD       Encounter Date: 12/12/2017  Check In: Session Check In - 12/12/17 0841    Check-In          Staff Present  Andi Hence, RN, BSN;Amber Fair, MS, ACSM RCEP, Exercise Physiologist;Molly DiVincenzo, [place], ACSM RCEP, Exercise Physiologist;Tyara Carol Ada, MS,ACSM CEP, Exercise Physiologist    Supervising physician immediately available to respond to emergencies  [facility] immediately available    Physician(s)  Dr. Broadus John    Medication changes reported      No    Fall or balance concerns reported     No    Tobacco Cessation  No Change    Warm-up and Cool-down  Performed as group-led instruction    Resistance Training Performed  Yes    VAD Patient?  No        Pain Assessment          Currently in Pain?  No/denies    Multiple Pain Sites  No           Capillary Blood Glucose: No results found for this or any previous visit (from the past 24 hour(s)).    Social History   Tobacco Use  Smoking Status Former Smoker  . Years: 0.00  . Types: Cigars  Smokeless Tobacco Never Used  Tobacco Comment   average cigar 2 per month    Goals Met:  Exercise tolerated well  Goals Unmet:  Not Applicable  Comments: Pt started cardiac rehab today.  Pt tolerated light exercise without difficulty. VSS, telemetry-sinus rhythm with Twave inversion,  asymptomatic.  Medication list reconciled. Pt denies barriers to medicaiton compliance.  PSYCHOSOCIAL ASSESSMENT:  PHQ-0. Pt exhibits positive coping skills, hopeful outlook with supportive family. No psychosocial needs identified at this time, no psychosocial interventions necessary.    Pt enjoys playing pool with friends, reading and relaxing.    Pt oriented to  exercise equipment and routine.    Understanding verbalized. Andi Hence, RN, BSN Cardiac Pulmonary Rehab 12/12/17 5:12 PM    Dr. Fransico Him is Medical Director for Cardiac Rehab at [facility].

## 2017-12-14 ENCOUNTER — Encounter (HOSPITAL_COMMUNITY): Payer: BLUE CROSS/BLUE SHIELD

## 2017-12-14 ENCOUNTER — Encounter (HOSPITAL_COMMUNITY)
Admission: RE | Admit: 2017-12-14 | Discharge: 2017-12-14 | Disposition: A | Discharge status: Home / Self Care | Payer: BLUE CROSS/BLUE SHIELD | Source: Ambulatory Visit | Attending: Cardiology | Admitting: Cardiology

## 2017-12-14 DIAGNOSIS — Q2549 Other congenital malformations of aorta: Secondary | ICD-10-CM | POA: Diagnosis not present

## 2017-12-14 DIAGNOSIS — Z9889 Other specified postprocedural states: Secondary | ICD-10-CM

## 2017-12-14 DIAGNOSIS — Z8679 Personal history of other diseases of the circulatory system: Secondary | ICD-10-CM

## 2017-12-14 NOTE — Progress Notes (Signed)
HPI: Follow-up thoracic aortic aneurysm.  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  Preoperative echocardiogram showed normal LV function, moderate diastolic dysfunction, aortic aneurysm with mild aortic insufficiency and moderate left atrial enlargement.  Cardiac catheterization preoperatively showed no coronary disease.  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  Preoperative carotid Dopplers showed no significant stenosis.  On March 14 patient had repair of sinus of Valsalva aneurysm using a graft with resuspension of aortic valve.  Postoperative course complicated by atrial flutter and patient had TEE guided cardioversion.  Patient had dehiscence of his upper portion of the sternal wound and was treated with Keflex.  Follow-up urinalysis did show red blood cells.  Since last seen he has some residual chest soreness but denies dyspnea, palpitations or syncope.   Current Outpatient Medications  Medication Sig Dispense Refill  . acetaminophen (TYLENOL) 325 MG tablet Take 2 tablets (650 mg total) by mouth every 6 (six) hours as needed for mild pain or moderate pain.    Marland Kitchen amiodarone (PACERONE) 200 MG tablet Take 1 tablet twice a day until we see you in follow-up. 60 tablet 1  . aspirin EC 81 MG tablet Take 81 mg by mouth daily.    Marland Kitchen ELIQUIS 5 MG TABS tablet TAKE 1 TABLET BY MOUTH TWICE DAILY 60 tablet 1  . EPINEPHrine (EPIPEN 2-PAK) 0.3 mg/0.3 mL IJ SOAJ injection Inject 0.3 mg into the muscle as needed (for allergic reaction).     Marland Kitchen guaiFENesin (MUCINEX) 600 MG 12 hr tablet Take 600 mg by mouth 2 (two) times daily as needed for cough or to loosen phlegm.     . metoprolol tartrate (LOPRESSOR) 50 MG tablet Take 1 tablet (50 mg total) by mouth 2 (two) times daily. 60 tablet 1  . Multiple Vitamin (MULTIVITAMIN) tablet Take 1 tablet by mouth daily.    . valsartan-hydrochlorothiazide (DIOVAN-HCT) 160-12.5 MG tablet Take 1 tablet by  mouth daily.  6   No current facility-administered medications for this visit.      Past Medical History:  Diagnosis Date  . Arthritis   . Diverticulosis   . Hiatal hernia    small per ct 2016  . Hypertension   . Hypertrophy of prostate    mild  . OSA on CPAP    per pt moderate osa per study  . Sinus of Valsalva aneurysm   . Wears glasses     Past Surgical History:  Procedure Laterality Date  . CARDIOVERSION N/A 09/15/2017   Procedure: CARDIOVERSION;  Surgeon: Wendall Stade, MD;  Location: [facility] ENDOSCOPY;  Service: Cardiovascular;  Laterality: N/A;  . COLON RESECTION    . ELBOW SURGERY Right 1999  . KNEE ARTHROSCOPY Left 1995  . RIGHT/LEFT HEART CATH AND CORONARY ANGIOGRAPHY N/A 08/24/2017   Procedure: RIGHT/LEFT HEART CATH AND CORONARY ANGIOGRAPHY;  Surgeon: Kathleene Hazel, MD;  Location: [facility] INVASIVE CV LAB;  Service: Cardiovascular;  Laterality: N/A;  . SHOULDER SURGERY Bilateral right 1992/  left 1987  . TEE WITHOUT CARDIOVERSION N/A 09/08/2017   Procedure: TRANSESOPHAGEAL ECHOCARDIOGRAM (TEE);  Surgeon: Loreli Slot, MD;  Location: [facility] OR;  Service: Open Heart Surgery;  Laterality: N/A;  . TEE WITHOUT CARDIOVERSION N/A 09/15/2017   Procedure: TRANSESOPHAGEAL ECHOCARDIOGRAM (TEE);  Surgeon: Wendall Stade, MD;  Location: [facility] ENDOSCOPY;  Service: Cardiovascular;  Laterality: N/A;  . THORACIC AORTIC ANEURYSM REPAIR N/A 09/08/2017   Procedure: REPAIR OF SINUS  OF VALSALVA ANEURYSM;  Surgeon: Loreli SlotHendrickson, Steven C, MD;  [facility];  Service: Open Heart Surgery;  Laterality: N/A;  Using 30mm Valsalva Gelweave Graft  . UMBILICAL HERNIA REPAIR  10/2015  . VASECTOMY Bilateral 06/10/2016   Procedure: VASECTOMY;  Surgeon: Malen GauzePatrick L McKenzie, MD;  Location: [facility];  Service: Urology;  Laterality: Bilateral;    Social History   Socioeconomic History  . Marital status: Divorced    Spouse name: Not on file  . Number of children: 2  . Years of  education: Not on file  . Highest education level: Not on file  Occupational History    Comment: Counseling  Social Needs  . Financial resource strain: Not on file  . Food insecurity:    Worry: Not on file    Inability: Not on file  . Transportation needs:    Medical: Not on file    Non-medical: Not on file  Tobacco Use  . Smoking status: Former Smoker    Years: 0.00    Types: Cigars  . Smokeless tobacco: Never Used  . Tobacco comment: average cigar 2 per month  Substance and Sexual Activity  . Alcohol use: Yes    Comment: OCCASIONAL  . Drug use: No  . Sexual activity: Not on file  Lifestyle  . Physical activity:    Days per week: Not on file    Minutes per session: Not on file  . Stress: Not on file  Relationships  . Social connections:    Talks on phone: Not on file    Gets together: Not on file    Attends religious service: Not on file    Active member of club or organization: Not on file    Attends meetings of clubs or organizations: Not on file    Relationship status: Not on file  . Intimate partner violence:    Fear of current or ex partner: Not on file    Emotionally abused: Not on file    Physically abused: Not on file    Forced sexual activity: Not on file  Other Topics Concern  . Not on file  Social History Narrative  . Not on file    Family History  Problem Relation Age of Onset  . Hypertension Mother   . COPD Father     ROS: no fevers or chills, productive cough, hemoptysis, dysphasia, odynophagia, melena, hematochezia, dysuria, hematuria, rash, seizure activity, orthopnea, PND, pedal edema, claudication. Remaining systems are negative.  Physical Exam: Well-developed well-nourished in no acute distress.  Skin is warm and dry.  HEENT is normal.  Neck is supple.  Chest is clear to auscultation with normal expansion.  Sternotomy without evidence of infection. Cardiovascular exam is regular rate and rhythm.  Abdominal exam nontender or distended.  No masses palpated. Extremities show no edema. neuro grossly intact   A/P  1 thoracic aortic aneurysm-status post repair.  Patient doing well.  He will need follow-up CTAs in the future.  2 postoperative atrial flutter-patient remains in sinus rhythm on examination.  I will discontinue amiodarone as this is likely postoperative atrial arrhythmia.  Continue apixaban for now.  We will see him back in 3 months and if he hold sinus rhythm will discontinue apixaban at that time.  3 hypertension-blood pressure mildly elevated.  Increase Diovan to 320 mg daily.  Check potassium and renal function in 1 week.  Adjust regimen based on follow-up readings. Some difficulties with ED; if continues off amiodarone, will wean metoprolol.  4 history of hematuria-Pt instructed to follow-up urology.  Olga Millers, MD

## 2017-12-15 ENCOUNTER — Ambulatory Visit (INDEPENDENT_AMBULATORY_CARE_PROVIDER_SITE_OTHER): Payer: BLUE CROSS/BLUE SHIELD | Admitting: Cardiology

## 2017-12-15 ENCOUNTER — Encounter: Payer: Self-pay | Admitting: Cardiology

## 2017-12-15 VITALS — BP 142/88 | HR 51 | Ht 74.0 in | Wt 298.8 lb

## 2017-12-15 DIAGNOSIS — I712 Thoracic aortic aneurysm, without rupture, unspecified: Secondary | ICD-10-CM

## 2017-12-15 DIAGNOSIS — I483 Typical atrial flutter: Secondary | ICD-10-CM | POA: Diagnosis not present

## 2017-12-15 DIAGNOSIS — I10 Essential (primary) hypertension: Secondary | ICD-10-CM | POA: Diagnosis not present

## 2017-12-15 MED ORDER — VALSARTAN-HYDROCHLOROTHIAZIDE 320-12.5 MG PO TABS: 1.0000 | ORAL_TABLET | Freq: Every day | ORAL | 3 refills | Status: DC

## 2017-12-15 NOTE — Patient Instructions (Signed)
Medication Instructions:   STOP AMIODARONE  CHANGE VALSARTAN/HCTZ TO 320/12.5 MG ONCE DAILY  Labwork:  Your physician recommends that you return for lab work in: ONE WEEK  Follow-Up:  Your physician recommends that you schedule a follow-up appointment in: 3 MONTHS WITH DR Jens SomRENSHAW   If you need a refill on your cardiac medications before your next appointment, please call your pharmacy.

## 2017-12-16 ENCOUNTER — Encounter (HOSPITAL_COMMUNITY)
Admission: RE | Admit: 2017-12-16 | Discharge: 2017-12-16 | Disposition: A | Discharge status: Home / Self Care | Payer: BLUE CROSS/BLUE SHIELD | Source: Ambulatory Visit | Attending: Cardiology | Admitting: Cardiology

## 2017-12-16 DIAGNOSIS — Z8679 Personal history of other diseases of the circulatory system: Secondary | ICD-10-CM

## 2017-12-16 DIAGNOSIS — Q2549 Other congenital malformations of aorta: Secondary | ICD-10-CM | POA: Diagnosis not present

## 2017-12-16 DIAGNOSIS — Z9889 Other specified postprocedural states: Secondary | ICD-10-CM

## 2017-12-16 DIAGNOSIS — Q2543 Congenital aneurysm of aorta: Secondary | ICD-10-CM

## 2017-12-19 ENCOUNTER — Encounter (HOSPITAL_COMMUNITY): Payer: BLUE CROSS/BLUE SHIELD

## 2017-12-19 ENCOUNTER — Encounter (HOSPITAL_COMMUNITY)
Admission: RE | Admit: 2017-12-19 | Discharge: 2017-12-19 | Disposition: A | Discharge status: Home / Self Care | Payer: BLUE CROSS/BLUE SHIELD | Source: Ambulatory Visit | Attending: Cardiology | Admitting: Cardiology

## 2017-12-19 VITALS — Wt 288.8 lb

## 2017-12-19 DIAGNOSIS — Z9889 Other specified postprocedural states: Secondary | ICD-10-CM

## 2017-12-19 DIAGNOSIS — Z8679 Personal history of other diseases of the circulatory system: Secondary | ICD-10-CM

## 2017-12-19 DIAGNOSIS — Q2549 Other congenital malformations of aorta: Secondary | ICD-10-CM

## 2017-12-19 NOTE — Progress Notes (Signed)
Reviewed home exercise with pt today.  Pt plans to walk and/or [facility]. Pt plans to cardio equipment and weight machines.Reviewed THR, pulse, RPE, sign and symptoms, and when to call 911 or MD.  Also discussed weather considerations and indoor options.  Pt voiced understanding.    Jordin Dambrosio [employer],MS,ACSM RCEP

## 2017-12-19 NOTE — Progress Notes (Signed)
Matthew Hoffman 52 y.o. male Nutrition Note Spoke with pt. Nutrition Plan and Nutrition Survey goals reviewed with pt. Pt is following a Heart Healthy diet. Pt wants to lose wt. Pt has been trying to lose wt by choosing heart healthy foods, using portion control, and staying physically active. Additional wt loss tips reviewed. Pt is prediabetic. Discussed the importance of controlling portion size of carbohydrates at meals and snacks, to help manage blood glucose levels. Pt verbalized understanding and reports he is motivated to make these changes.  Pt expressed understanding of the information reviewed. Pt aware of nutrition education classes offered and plans on attending nutrition classes. Pt attended Diabetes Q & A class on 12/19/17.   Lab Results  Component Value Date   HGBA1C 5.8 (H) 09/06/2017    Wt Readings from Last 3 Encounters:  12/19/17 288 lb 12.8 oz (131 kg)  12/15/17 298 lb 12.8 oz (135.5 kg)  12/08/17 288 lb 9.3 oz (130.9 kg)    Nutrition Diagnosis ? Food-and nutrition-related knowledge deficit related to lack of exposure to information as related to diagnosis of: ? CVD ? Pre-DM ? Obesity related to excessive energy intake as evidenced by a BMI of 37.05 kg/m2  Nutrition Intervention ? Pt's individual nutrition plan reviewed with pt. ? Benefits of adopting Heart Healthy diet discussed when Medficts reviewed.   ? Pt given handouts for: ? Nutrition I class ? Nutrition II class ? Diabetes Blitz Class ? Consistent vit K diet ? low sodium ? DM ? pre-diabetes ? Diabetes Q & A class ? Continue client-centered nutrition education by RD, as part of interdisciplinary care.  Goal(s) ? Pt to describe the potential benefits of adopting a Heart Healthy diet.  ? Pt to identify and limit food sources of saturated fat, trans fat, and sodium ? Pt to identify food quantities necessary to achieve weight loss of 6-10 lb at graduation from cardiac rehab.  ? Pt to describe the benefit of  including fruits, vegetables, whole grains, and low-fat dairy products in a heart healthy meal plan. ? CBG concentrations in the normal range or as close to normal as is safely possible. ? Use pre-meal and post-meal CBG's and A1c to determine whether adjustments in food/meal planning will be beneficial or if any meds need to be combined with nutrition therapy. ? Pt able to name foods that affect blood glucose  ? Improved blood glucose control as evidenced by pt's A1c trending from  to less than 7.0. ? Pt able to name foods rich in vitamin K. (Pt taking Coumadin/Warfarin).  Ross MarcusAubrey Burklin, MS, RD, LDN 12/19/2017 8:53 AM

## 2017-12-21 ENCOUNTER — Encounter (HOSPITAL_COMMUNITY): Payer: BLUE CROSS/BLUE SHIELD

## 2017-12-21 ENCOUNTER — Encounter (HOSPITAL_COMMUNITY)
Admission: RE | Admit: 2017-12-21 | Discharge: 2017-12-21 | Disposition: A | Discharge status: Home / Self Care | Payer: BLUE CROSS/BLUE SHIELD | Source: Ambulatory Visit | Attending: Cardiology | Admitting: Cardiology

## 2017-12-21 DIAGNOSIS — Z9889 Other specified postprocedural states: Secondary | ICD-10-CM

## 2017-12-21 DIAGNOSIS — Z8679 Personal history of other diseases of the circulatory system: Secondary | ICD-10-CM

## 2017-12-21 DIAGNOSIS — Q2549 Other congenital malformations of aorta: Secondary | ICD-10-CM | POA: Diagnosis not present

## 2017-12-23 ENCOUNTER — Encounter (HOSPITAL_COMMUNITY)
Admission: RE | Admit: 2017-12-23 | Discharge: 2017-12-23 | Disposition: A | Discharge status: Home / Self Care | Payer: BLUE CROSS/BLUE SHIELD | Source: Ambulatory Visit | Attending: Cardiology | Admitting: Cardiology

## 2017-12-23 DIAGNOSIS — Z9889 Other specified postprocedural states: Secondary | ICD-10-CM

## 2017-12-23 DIAGNOSIS — Z8679 Personal history of other diseases of the circulatory system: Secondary | ICD-10-CM

## 2017-12-23 DIAGNOSIS — Q2549 Other congenital malformations of aorta: Secondary | ICD-10-CM

## 2017-12-26 ENCOUNTER — Encounter (HOSPITAL_COMMUNITY)
Admission: RE | Admit: 2017-12-26 | Discharge: 2017-12-26 | Disposition: A | Discharge status: Home / Self Care | Payer: BLUE CROSS/BLUE SHIELD | Source: Ambulatory Visit | Attending: Cardiology | Admitting: Cardiology

## 2017-12-26 ENCOUNTER — Encounter (HOSPITAL_COMMUNITY): Payer: BLUE CROSS/BLUE SHIELD

## 2017-12-26 DIAGNOSIS — I10 Essential (primary) hypertension: Secondary | ICD-10-CM | POA: Diagnosis not present

## 2017-12-26 DIAGNOSIS — Z8679 Personal history of other diseases of the circulatory system: Secondary | ICD-10-CM

## 2017-12-26 DIAGNOSIS — Z9889 Other specified postprocedural states: Secondary | ICD-10-CM | POA: Insufficient documentation

## 2017-12-26 DIAGNOSIS — G4733 Obstructive sleep apnea (adult) (pediatric): Secondary | ICD-10-CM | POA: Diagnosis not present

## 2017-12-26 DIAGNOSIS — Z7982 Long term (current) use of aspirin: Secondary | ICD-10-CM | POA: Diagnosis not present

## 2017-12-26 DIAGNOSIS — Q2549 Other congenital malformations of aorta: Secondary | ICD-10-CM | POA: Diagnosis present

## 2017-12-26 DIAGNOSIS — Z79899 Other long term (current) drug therapy: Secondary | ICD-10-CM | POA: Insufficient documentation

## 2017-12-26 DIAGNOSIS — Z7901 Long term (current) use of anticoagulants: Secondary | ICD-10-CM | POA: Diagnosis not present

## 2017-12-26 DIAGNOSIS — Z87891 Personal history of nicotine dependence: Secondary | ICD-10-CM | POA: Diagnosis not present

## 2017-12-28 ENCOUNTER — Encounter (HOSPITAL_COMMUNITY): Payer: BLUE CROSS/BLUE SHIELD

## 2017-12-28 ENCOUNTER — Encounter (HOSPITAL_COMMUNITY)
Admission: RE | Admit: 2017-12-28 | Discharge: 2017-12-28 | Disposition: A | Discharge status: Home / Self Care | Payer: BLUE CROSS/BLUE SHIELD | Source: Ambulatory Visit | Attending: Cardiology | Admitting: Cardiology

## 2017-12-28 DIAGNOSIS — Q2549 Other congenital malformations of aorta: Secondary | ICD-10-CM | POA: Diagnosis not present

## 2017-12-30 ENCOUNTER — Encounter (HOSPITAL_COMMUNITY)
Admission: RE | Admit: 2017-12-30 | Discharge: 2017-12-30 | Disposition: A | Discharge status: Home / Self Care | Payer: BLUE CROSS/BLUE SHIELD | Source: Ambulatory Visit | Attending: Cardiology | Admitting: Cardiology

## 2017-12-30 DIAGNOSIS — Q2549 Other congenital malformations of aorta: Secondary | ICD-10-CM | POA: Diagnosis not present

## 2017-12-30 DIAGNOSIS — Z9889 Other specified postprocedural states: Principal | ICD-10-CM

## 2017-12-30 DIAGNOSIS — Z8679 Personal history of other diseases of the circulatory system: Secondary | ICD-10-CM

## 2018-01-02 ENCOUNTER — Encounter (HOSPITAL_COMMUNITY): Payer: BLUE CROSS/BLUE SHIELD

## 2018-01-02 ENCOUNTER — Encounter (HOSPITAL_COMMUNITY)
Admission: RE | Admit: 2018-01-02 | Discharge: 2018-01-02 | Disposition: A | Discharge status: Home / Self Care | Payer: BLUE CROSS/BLUE SHIELD | Source: Ambulatory Visit | Attending: Cardiology | Admitting: Cardiology

## 2018-01-02 DIAGNOSIS — Z8679 Personal history of other diseases of the circulatory system: Secondary | ICD-10-CM

## 2018-01-02 DIAGNOSIS — Z9889 Other specified postprocedural states: Principal | ICD-10-CM

## 2018-01-02 DIAGNOSIS — Q2549 Other congenital malformations of aorta: Secondary | ICD-10-CM | POA: Diagnosis not present

## 2018-01-04 ENCOUNTER — Encounter (HOSPITAL_COMMUNITY): Payer: BLUE CROSS/BLUE SHIELD

## 2018-01-04 ENCOUNTER — Encounter (HOSPITAL_COMMUNITY)
Admission: RE | Admit: 2018-01-04 | Discharge: 2018-01-04 | Disposition: A | Discharge status: Home / Self Care | Payer: BLUE CROSS/BLUE SHIELD | Source: Ambulatory Visit | Attending: Cardiology | Admitting: Cardiology

## 2018-01-04 DIAGNOSIS — Z8679 Personal history of other diseases of the circulatory system: Secondary | ICD-10-CM

## 2018-01-04 DIAGNOSIS — Q2549 Other congenital malformations of aorta: Secondary | ICD-10-CM

## 2018-01-04 DIAGNOSIS — Z9889 Other specified postprocedural states: Principal | ICD-10-CM

## 2018-01-05 ENCOUNTER — Encounter (HOSPITAL_COMMUNITY): Payer: Self-pay

## 2018-01-05 NOTE — Progress Notes (Signed)
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  acetaminophen (TYLENOL) 325 MG tablet, Take 2 tablets (650 mg total) by mouth every 6 (six) hours as needed for mild pain or moderate pain., Disp: , Rfl:  .  aspirin EC 81 MG tablet, Take 81 mg by mouth daily., Disp: , Rfl:  .  ELIQUIS 5 MG TABS tablet, TAKE 1 TABLET BY MOUTH TWICE DAILY, Disp: 60 tablet, Rfl: 1 .  EPINEPHrine (EPIPEN 2-PAK) 0.3 mg/0.3 mL IJ SOAJ injection, Inject 0.3 mg into the muscle as needed (for allergic reaction). , Disp: , Rfl:  .  guaiFENesin (MUCINEX) 600 MG 12 hr tablet, Take 600 mg by mouth 2 (two) times daily as needed for cough or to loosen phlegm. , Disp: , Rfl:  .  metoprolol tartrate (LOPRESSOR) 50 MG tablet, Take 1 tablet (50 mg total) by mouth 2 (two) times daily., Disp: 60 tablet, Rfl: 1 .  Multiple Vitamin (MULTIVITAMIN) tablet, Take 1 tablet by mouth daily., Disp: , Rfl:  .  valsartan-hydrochlorothiazide (DIOVAN-HCT) 320-12.5 MG tablet, Take 1 tablet by mouth daily., Disp: 90 tablet, Rfl: 3  Past Medical History: Past Medical History:  Diagnosis Date  . Arthritis   . Diverticulosis   . Hiatal hernia    small per ct 2016  . Hypertension   . Hypertrophy of prostate    mild  . OSA on CPAP    per pt moderate osa per study  . Sinus of Valsalva aneurysm   . Wears glasses     Tobacco Use: Social History   Tobacco Use  Smoking  Status Former Smoker  . Years: 0.00  . Types: Cigars  Smokeless Tobacco Never Used  Tobacco Comment   average cigar 2 per month    Labs: Recent Review Flowsheet Data    Labs for ITP Cardiac and Pulmonary Rehab Latest Ref Rng & Units 09/08/2017 09/08/2017 09/08/2017 09/08/2017 09/09/2017   Hemoglobin A1c 4.8 - 5.6 % - - - - -   PHART 7.350 - 7.450 7.308(L) 7.331(L) 7.329(L) - -   PCO2ART 32.0 - 48.0 mmHg 42.1 40.6 36.8 - -   HCO3 20.0 - 28.0 mmol/L 21.4 21.4 19.2(L) - -   TCO2 22 - 32 mmol/L 23 23 20(L) 20(L) 23   ACIDBASEDEF 0.0 - 2.0 mmol/L 5.0(H) 4.0(H) 6.0(H) - -   O2SAT % 99.0 99.0 99.0 - -      Capillary Blood Glucose: Lab Results  Component Value Date   GLUCAP 110 (H) 09/12/2017   GLUCAP 110 (H) 09/12/2017   GLUCAP 105 (H) 09/11/2017   GLUCAP 129 (H) 09/11/2017   GLUCAP 117 (H) 09/11/2017     Exercise Target Goals:    Exercise Program Goal: Individual exercise prescription set using results from initial 6 min walk test and THRR while considering  patient's activity barriers and safety.   Exercise Prescription Goal: Initial exercise prescription builds to 30-45 minutes a day of  aerobic activity, 2-3 days per week.  Home exercise guidelines will be given to patient during program as part of exercise prescription that the participant will acknowledge.  Activity Barriers & Risk Stratification: Activity Barriers & Cardiac Risk Stratification - 12/08/17 0835    Activity Barriers & Cardiac Risk Stratification          Activity Barriers  Incisional Pain;None    Cardiac Risk Stratification  High           6 Minute Walk: 6 Minute Walk    6 Minute Walk    Row Name 12/08/17 1015   Phase  Initial   Distance  1600 feet   Walk Time  6 minutes   # of Rest Breaks  0   MPH  3.03   METS  3.69   RPE  11   Perceived Dyspnea   0   VO2 Peak  12.9   Symptoms  No   Resting HR  49 bpm   Resting BP  128/78   Resting Oxygen Saturation   97 %   Exercise Oxygen Saturation   during 6 min walk  98 %   Max Ex. HR  64 bpm   Max Ex. BP  140/82   2 Minute Post BP  122/84          Oxygen Initial Assessment:   Oxygen Re-Evaluation:   Oxygen Discharge (Final Oxygen Re-Evaluation):   Initial Exercise Prescription: Initial Exercise Prescription - 12/08/17 1000    Date of Initial Exercise RX and Referring Provider          Date  12/08/17    Referring Provider  Lewayne Bunting MD         Treadmill          MPH  3    Grade  0    Minutes  10    METs  3.3        Bike          Level  1.5    Minutes  10    METs  3.18        NuStep          Level  3    SPM  85    Minutes  10    METs  3        Prescription Details          Frequency (times per week)  3x    Duration  Progress to 30 minutes of continuous aerobic without signs/symptoms of physical distress        Intensity          THRR 40-80% of Max Heartrate  68-137    Ratings of Perceived Exertion  11-13    Perceived Dyspnea  0-4        Progression          Progression  Continue progressive overload as per policy without signs/symptoms or physical distress.        Resistance Training          Training Prescription  Yes    Weight  5lbs    Reps  10-15           Perform Capillary Blood Glucose checks as needed.  Exercise Prescription Changes: Exercise Prescription Changes    Response to Exercise    Row Name 12/12/17 1609 12/26/17 1237 12/27/17 1200   Blood Pressure (Admit)  118/70  134/80  no documentation   Blood Pressure (Exercise)  130/90  142/62  no documentation   Blood Pressure (Exit)  124/84  120/70  no documentation   Heart Rate (Admit)  51 bpm  60 bpm  no documentation   Heart Rate (Exercise)  73 bpm  81 bpm  no documentation   Heart Rate (Exit)  51 bpm  68 bpm  no documentation   Rating of Perceived Exertion (Exercise)  12  12  no documentation   Symptoms  none   none   no documentation   Comments  pt responded well to first exercise session and was  oriented to exercise equipment today  no documentation  no documentation   Duration  Continue with 30 min of aerobic exercise without signs/symptoms of physical distress.  Continue with 30 min of aerobic exercise without signs/symptoms of physical distress.  no documentation   Intensity  THRR unchanged  THRR unchanged  no documentation       Progression    Row Name 12/12/17 1609 12/26/17 1237 12/27/17 1200   Progression  Continue to progress workloads to maintain intensity without signs/symptoms of physical distress.  Continue to progress workloads to maintain intensity without signs/symptoms of physical distress.  no documentation   Average METs  2.9  3.6  no documentation       Resistance Training    Row Name 12/12/17 1609 12/26/17 1237 12/27/17 1200   Training Prescription  Yes  Yes  no documentation   Weight  5lbs  9lbs  no documentation   Reps  10-15  10-15  no documentation   Time  10 Minutes  10 Minutes  no documentation       Treadmill    Row Name 12/12/17 1609 12/26/17 1237 12/27/17 1200   MPH  3  3.2  no documentation   Grade  0  1  no documentation   Minutes  10  10  no documentation   METs  3.3  3.89  no documentation       Bike    Row Name 12/12/17 1609 12/26/17 1237 12/27/17 1200   Level  1.5  2.7  no documentation   Minutes  10  10  no documentation   METs  3.16  4.91  no documentation       NuStep    Row Name 12/12/17 1609 12/26/17 1237 12/27/17 1200   Level  3  4  no documentation   SPM  85  85  no documentation   Minutes  10  10  no documentation   METs  2.2  2  no documentation       Home Exercise Plan    Row Name 12/12/17 1609 12/26/17 1237 12/27/17 1200   Plans to continue exercise at  no documentation  Community Facility (comment) [employer] or [employer]  no documentation   Frequency  no documentation  Add 2 additional days to program exercise sessions.  no documentation   Initial Home Exercises Provided  no documentation  12/19/17  no documentation           Exercise Comments: Exercise Comments    Row Name 12/12/17 1610 12/27/17 1231 01/04/18 1154   Exercise Comments  Pt responded well to first exercise session. Pt will continue to exercise safely in cardiac rehab without signs and symptoms of phsyical distress.   Reviewed METs and goals. Pt is progessing nicely in cardiac rehab. Rehab staff will continue to monitor activity levels and exercise progression.  Reviewed METs and goals. Pt is progessing  nicely in cardiac rehab. Rehab staff will continue to monitor activity levels and exercise progression.      Exercise Goals and Review: Exercise Goals    Exercise Goals    Row Name 12/08/17 1018   Increase Physical Activity  Yes   Intervention  Provide advice, education, support and counseling about physical activity/exercise needs.;Develop an individualized exercise prescription for aerobic and resistive training based on initial evaluation findings, risk stratification, comorbidities and participant's personal goals.   Expected Outcomes  Short Term: Attend rehab on a regular basis to increase amount of physical activity.;Long Term: Add in home exercise to make exercise part of routine and to increase amount of physical activity.;Long Term: Exercising regularly at least 3-5 days a week.   Increase Strength and Stamina  Yes   Intervention  Provide advice, education, support and counseling about physical activity/exercise needs.;Develop an individualized exercise prescription for aerobic and resistive training based on initial evaluation findings, risk stratification, comorbidities and participant's personal goals.   Expected Outcomes  Short Term: Increase workloads from initial exercise prescription for resistance, speed, and METs.;Short Term: Perform resistance training exercises routinely during rehab and add in resistance training at home;Long Term: Improve cardiorespiratory fitness, muscular endurance and strength as measured by increased METs  and functional capacity ( )   Able to understand and use rate of perceived exertion (RPE) scale  Yes   Intervention  Provide education and explanation on how to use RPE scale   Expected Outcomes  Short Term: Able to use RPE daily in rehab to express subjective intensity level;Long Term:  Able to use RPE to guide intensity level when exercising independently   Knowledge and understanding of Target Heart Rate Range (THRR)  Yes   Intervention  Provide education and explanation of THRR including how the numbers were predicted and where they are located for reference   Expected Outcomes  Short Term: Able to state/look up THRR;Short Term: Able to use daily as guideline for intensity in rehab;Long Term: Able to use THRR to govern intensity when exercising independently   Able to check pulse independently  Yes   Intervention  Provide education and demonstration on how to check pulse in carotid and radial arteries.;Review the importance of being able to check your own pulse for safety during independent exercise   Expected Outcomes  Short Term: Able to explain why pulse checking is important during independent exercise;Long Term: Able to check pulse independently and accurately   Understanding of Exercise Prescription  Yes   Intervention  Provide education, explanation, and written materials on patient's individual exercise prescription   Expected Outcomes  Short Term: Able to explain program exercise prescription;Long Term: Able to explain home exercise prescription to exercise independently          Exercise Goals Re-Evaluation : Exercise Goals Re-Evaluation    Exercise Goal Re-Evaluation    Row Name 12/13/17 1611 12/19/17 1134 01/04/18 1154   Exercise Goals Review  Increase Physical Activity;Able to understand and use rate of perceived exertion (RPE) scale  Able to understand and use rate of perceived exertion (RPE) scale;Increase Physical Activity;Knowledge and understanding of Target Heart Rate  Range (THRR);Understanding of Exercise Prescription;Increase Strength and Stamina;Able to check pulse independently  Able to understand and use rate of perceived exertion (RPE) scale;Increase Physical Activity;Knowledge and understanding of Target Heart Rate Range (THRR);Understanding of Exercise Prescription;Increase Strength and Stamina;Able to check pulse independently   Comments  Pt demonstrated proper use of RPE scale and was able to exercise for 30  minutes without diffculty. Rehab staff will continue to monitor pt's safety and exercise progression  Reviewed HEP. Pt plans to exercise at [facility] 2x/week in addition to coming to cardiac rehab. Discused with patient activity limitations and how to progress exercise intensity. Pt voiced understanding  Pt is compliant with HEP of cardio equipment and weight machines at fitness center. Pt progresses well in workloads and intensity levels in cardiac rehab.    Expected Outcomes  Pt will continue to build on cardiorespiratory fitness by participating in cardiac rehab.   Pt will continue to build on cardiorespiratory fitness by participating in cardiac rehab and going to fitness centers.   Pt will continue to build on cardiorespiratory fitness by participating in cardiac rehab and going to fitness centers.            Discharge Exercise Prescription (Final Exercise Prescription Changes): Exercise Prescription Changes - 12/27/17 1200    Response to Exercise          Blood Pressure (Admit)  --    Blood Pressure (Exercise)  --    Blood Pressure (Exit)  --    Heart Rate (Admit)  --    Heart Rate (Exercise)  --    Heart Rate (Exit)  --    Rating of Perceived Exertion (Exercise)  --    Symptoms  --    Duration  --    Intensity  --        Progression          Progression  --    Average METs  --        Resistance Training          Training Prescription  --    Weight  --    Reps  --    Time  --        Treadmill          MPH  --     Grade  --    Minutes  --    METs  --        Bike          Level  --    Minutes  --    METs  --        NuStep          Level  --    SPM  --    Minutes  --    METs  --        Home Exercise Plan          Plans to continue exercise at  --    Frequency  --    Initial Home Exercises Provided  --           Nutrition:  Target Goals: Understanding of nutrition guidelines, daily intake of sodium 1500mg , cholesterol 200mg , calories 30% from fat and 7% or less from saturated fats, daily to have 5 or more servings of fruits and vegetables.  Biometrics: Pre Biometrics - 12/08/17 1019    Pre Biometrics          Height  6\' 2"  (1.88 m)    Weight  288 lb 9.3 oz (130.9 kg)    Waist Circumference  49 inches    Hip Circumference  49.5 inches    Waist to Hip Ratio  0.99 %    BMI (Calculated)  37.04    Triceps Skinfold  10 mm    % Body Fat  32.2 %    Grip Strength  52 kg    Flexibility  13 in    Single Leg Stand  13.12 seconds            Nutrition Therapy Plan and Nutrition Goals: Nutrition Therapy & Goals - 12/08/17 1156    Nutrition Therapy          Diet  heart healthy           Nutrition Assessments: Nutrition Assessments - 12/08/17 1156    MEDFICTS Scores          Pre Score  65           Nutrition Goals Re-Evaluation:   Nutrition Goals Re-Evaluation:   Nutrition Goals Discharge (Final Nutrition Goals Re-Evaluation):   Psychosocial: Target Goals: Acknowledge presence or absence of significant depression and/or stress, maximize coping skills, provide positive support system. Participant is able to verbalize types and ability to use techniques and skills needed for reducing stress and depression.  Initial Review & Psychosocial Screening: Initial Psych Review & Screening - 12/08/17 1054    Initial Review          Current issues with  None Identified        Family Dynamics          Good Support System?  Yes children and family    Comments  pt  exhibit positive outlook with good coping skills.         Barriers          Psychosocial barriers to participate in program  There are no identifiable barriers or psychosocial needs.        Screening Interventions          Interventions  Encouraged to exercise           Quality of Life Scores: Quality of Life - 12/08/17 1021    Quality of Life Scores          Health/Function Pre  24.87 %    Socioeconomic Pre  27.14 %    Psych/Spiritual Pre  29.64 %    Family Pre  26.38 %    GLOBAL Pre  26.55 %          Scores of 19 and below usually indicate a poorer quality of life in these areas.  A difference of  2-3 points is a clinically meaningful difference.  A difference of 2-3 points in the total score of the Quality of Life Index has been associated with significant improvement in overall quality of life, self-image, physical symptoms, and general health in studies assessing change in quality of life.  PHQ-9: Recent Review Flowsheet Data    Depression screen [facility] 2/9 12/12/2017   Decreased Interest 0   Down, Depressed, Hopeless 0   PHQ - 2 Score 0     Interpretation of Total Score  Total Score Depression Severity:  1-4 = Minimal depression, 5-9 = Mild depression, 10-14 = Moderate depression, 15-19 = Moderately severe depression, 20-27 = Severe depression   Psychosocial Evaluation and Intervention: Psychosocial Evaluation - 12/12/17 1710    Psychosocial Evaluation & Interventions          Interventions  Encouraged to exercise with the program and follow exercise prescription    Comments  no psychosocial needs identified, no interventions necessary     Expected Outcomes  pt will exhibit positive outlook with good coping skills.     Continue Psychosocial Services   No Follow up required           Psychosocial  Re-Evaluation: Psychosocial Re-Evaluation    Psychosocial Re-Evaluation    Row Name 01/05/18 [phone]   Current issues with  None Identified   Comments  no  psychosocial needs identified, no interventions necessary.    Expected Outcomes  pt will exhibit positive outlook with good coping skills.    Interventions  Relaxation education;Encouraged to attend Cardiac Rehabilitation for the exercise   Continue Psychosocial Services   No Follow up required          Psychosocial Discharge (Final Psychosocial Re-Evaluation): Psychosocial Re-Evaluation - 01/05/18 0705    Psychosocial Re-Evaluation          Current issues with  None Identified    Comments  no psychosocial needs identified, no interventions necessary.     Expected Outcomes  pt will exhibit positive outlook with good coping skills.     Interventions  Relaxation education;Encouraged to attend Cardiac Rehabilitation for the exercise    Continue Psychosocial Services   No Follow up required           Vocational Rehabilitation: Provide vocational rehab assistance to qualifying candidates.   Vocational Rehab Evaluation & Intervention: Vocational Rehab - 12/08/17 1052    Initial Vocational Rehab Evaluation & Intervention          Assessment shows need for Vocational Rehabilitation  No counselor            Education: Education Goals: Education classes will be provided on a weekly basis, covering required topics. Participant will state understanding/return demonstration of topics presented.  Learning Barriers/Preferences: Learning Barriers/Preferences - 12/08/17 0834    Learning Barriers/Preferences          Learning Barriers  Sight    Learning Preferences  Verbal Instruction;Skilled Demonstration;Written Material;Individual Instruction           Education Topics: Count Your Pulse:  -Group instruction provided by verbal instruction, demonstration, patient participation and written materials to support subject.  Instructors address importance of being able to find your pulse and how to count your pulse when at home without a heart monitor.  Patients get hands on experience  counting their pulse with staff help and individually. Flowsheet Row CARDIAC REHAB PHASE II EXERCISE from 01/04/2018 in [facility] CARDIAC REHAB  Date  12/23/17  Instruction Review Code  2- Demonstrated Understanding      Heart Attack, Angina, and Risk Factor Modification:  -Group instruction provided by verbal instruction, video, and written materials to support subject.  Instructors address signs and symptoms of angina and heart attacks.    Also discuss risk factors for heart disease and how to make changes to improve heart health risk factors. Flowsheet Row CARDIAC REHAB PHASE II EXERCISE from 01/04/2018 in [facility] CARDIAC REHAB  Date  12/14/17  Educator  RN  Instruction Review Code  2- Demonstrated Understanding      Functional Fitness:  -Group instruction provided by verbal instruction, demonstration, patient participation, and written materials to support subject.  Instructors address safety measures for doing things around the house.  Discuss how to get up and down off the floor, how to pick things up properly, how to safely get out of a chair without assistance, and balance training.   Meditation and Mindfulness:  -Group instruction provided by verbal instruction, patient participation, and written materials to support subject.  Instructor addresses importance of mindfulness and meditation practice to help reduce stress and improve awareness.  Instructor also leads participants through a meditation exercise.    Stretching for  Flexibility and Mobility:  -Group instruction provided by verbal instruction, patient participation, and written materials to support subject.  Instructors lead participants through series of stretches that are designed to increase flexibility thus improving mobility.  These stretches are additional exercise for major muscle groups that are typically performed during regular warm up and cool down.   Hands Only CPR:   -Group verbal, video, and participation provides a basic overview of AHA guidelines for community CPR. Role-play of emergencies allow participants the opportunity to practice calling for help and chest compression technique with discussion of AED use.   Hypertension: -Group verbal and written instruction that provides a basic overview of hypertension including the most recent diagnostic guidelines, risk factor reduction with self-care instructions and medication management.    Nutrition I class: Heart Healthy Eating:  -Group instruction provided by PowerPoint slides, verbal discussion, and written materials to support subject matter. The instructor gives an explanation and review of the Therapeutic Lifestyle Changes diet recommendations, which includes a discussion on lipid goals, dietary fat, sodium, fiber, plant stanol/sterol esters, sugar, and the components of a well-balanced, healthy diet.   Nutrition II class: Lifestyle Skills:  -Group instruction provided by PowerPoint slides, verbal discussion, and written materials to support subject matter. The instructor gives an explanation and review of label reading, grocery shopping for heart health, heart healthy recipe modifications, and ways to make healthier choices when eating out.   Diabetes Question & Answer:  -Group instruction provided by PowerPoint slides, verbal discussion, and written materials to support subject matter. The instructor gives an explanation and review of diabetes co-morbidities, pre- and post-prandial blood glucose goals, pre-exercise blood glucose goals, signs, symptoms, and treatment of hypoglycemia and hyperglycemia, and foot care basics.   Diabetes Blitz:  -Group instruction provided by PowerPoint slides, verbal discussion, and written materials to support subject matter. The instructor gives an explanation and review of the physiology behind type 1 and type 2 diabetes, diabetes medications and rational behind  using different medications, pre- and post-prandial blood glucose recommendations and Hemoglobin A1c goals, diabetes diet, and exercise including blood glucose guidelines for exercising safely.    Portion Distortion:  -Group instruction provided by PowerPoint slides, verbal discussion, written materials, and food models to support subject matter. The instructor gives an explanation of serving size versus portion size, changes in portions sizes over the last 20 years, and what consists of a serving from each food group.   Stress Management:  -Group instruction provided by verbal instruction, video, and written materials to support subject matter.  Instructors review role of stress in heart disease and how to cope with stress positively.     Exercising on Your Own:  -Group instruction provided by verbal instruction, power point, and written materials to support subject.  Instructors discuss benefits of exercise, components of exercise, frequency and intensity of exercise, and end points for exercise.  Also discuss use of nitroglycerin and activating EMS.  Review options of places to exercise outside of rehab.  Review guidelines for sex with heart disease. Flowsheet Row CARDIAC REHAB PHASE II EXERCISE from 01/04/2018 in [facility] CARDIAC REHAB  Date  12/21/17  Educator  EP  Instruction Review Code  2- Demonstrated Understanding      Cardiac Drugs I:  -Group instruction provided by verbal instruction and written materials to support subject.  Instructor reviews cardiac drug classes: antiplatelets, anticoagulants, beta blockers, and statins.  Instructor discusses reasons, side effects, and lifestyle considerations for each drug class. Flowsheet Row  CARDIAC REHAB PHASE II EXERCISE from 01/04/2018 in [facility] CARDIAC REHAB  Date  01/04/18  Instruction Review Code  2- Demonstrated Understanding      Cardiac Drugs II:  -Group instruction provided by verbal  instruction and written materials to support subject.  Instructor reviews cardiac drug classes: angiotensin converting enzyme inhibitors (ACE-I), angiotensin II receptor blockers (ARBs), nitrates, and calcium channel blockers.  Instructor discusses reasons, side effects, and lifestyle considerations for each drug class.   Anatomy and Physiology of the Circulatory System:  Group verbal and written instruction and models provide basic cardiac anatomy and physiology, with the coronary electrical and arterial systems. Review of: AMI, Angina, Valve disease, Heart Failure, Peripheral Artery Disease, Cardiac Arrhythmia, Pacemakers, and the ICD.   Other Education:  -Group or individual verbal, written, or video instructions that support the educational goals of the cardiac rehab program.   Holiday Eating Survival Tips:  -Group instruction provided by PowerPoint slides, verbal discussion, and written materials to support subject matter. The instructor gives patients tips, tricks, and techniques to help them not only survive but enjoy the holidays despite the onslaught of food that accompanies the holidays.   Knowledge Questionnaire Score: Knowledge Questionnaire Score - 12/08/17 1020    Knowledge Questionnaire Score          Pre Score  20/24           Core Components/Risk Factors/Patient Goals at Admission: Personal Goals and Risk Factors at Admission - 12/08/17 1000    Core Components/Risk Factors/Patient Goals on Admission           Weight Management  Yes;Weight Loss    Intervention  Weight Management: Develop a combined nutrition and exercise program designed to reach desired caloric intake, while maintaining appropriate intake of nutrient and fiber, sodium and fats, and appropriate energy expenditure required for the weight goal.;Weight Management/Obesity: Establish reasonable short term and long term weight goals.;Weight Management: Provide education and appropriate resources to help  participant work on and attain dietary goals.    Admit Weight  288 lb 9.3 oz (130.9 kg)    Goal Weight: Short Term  285 lb (129.3 kg)    Goal Weight: Long Term  278 lb (126.1 kg)    Expected Outcomes  Short Term: Continue to assess and modify interventions until short term weight is achieved;Long Term: Adherence to nutrition and physical activity/exercise program aimed toward attainment of established weight goal;Weight Loss: Understanding of general recommendations for a balanced deficit meal plan, which promotes 1-2 lb weight loss per week and includes a negative energy balance of [id number] kcal/d;Understanding recommendations for meals to include 15-35% energy as protein, 25-35% energy from fat, 35-60% energy from carbohydrates, less than 200mg  of dietary cholesterol, 20-35 gm of total fiber daily;Understanding of distribution of calorie intake throughout the day with the consumption of 4-5 meals/snacks    Hypertension  Yes    Intervention  Provide education on lifestyle modifcations including regular physical activity/exercise, weight management, moderate sodium restriction and increased consumption of fresh fruit, vegetables, and low fat dairy, alcohol moderation, and smoking cessation.;Monitor prescription use compliance.    Expected Outcomes  Long Term: Maintenance of blood pressure at goal levels.;Short Term: Continued assessment and intervention until BP is < 140/76mm HG in hypertensive participants. < 130/75mm HG in hypertensive participants with diabetes, heart failure or chronic kidney disease.    Stress  Yes    Intervention  Offer individual and/or small group education and counseling on adjustment to heart disease,  stress management and health-related lifestyle change. Teach and support self-help strategies.;Refer participants experiencing significant psychosocial distress to appropriate mental health specialists for further evaluation and treatment. When possible, include family members and  significant others in education/counseling sessions.    Expected Outcomes  Short Term: Participant demonstrates changes in health-related behavior, relaxation and other stress management skills, ability to obtain effective social support, and compliance with psychotropic medications if prescribed.;Long Term: Emotional wellbeing is indicated by absence of clinically significant psychosocial distress or social isolation.           Core Components/Risk Factors/Patient Goals Review:  Goals and Risk Factor Review    Core Components/Risk Factors/Patient Goals Review    Row Name 12/12/17 1704 01/05/18 0704   Personal Goals Review  Lipids;Hypertension;Stress;Weight Management/Obesity  Lipids;Hypertension;Stress;Weight Management/Obesity   Review  pt with multiple CAD RF demonstrates willingness to participate in CR activities.pt personal goals are to improve blood pressure, weight loss and improved diet plan. pt works as Psychologist, occupationalautism counselor. pt enjoys reading, relaxing and playing pool with friends.   pt personal goals are to improve blood pressure, weight loss and improved diet plan. pt VSS during rehab sessions.    Expected Outcomes  pt will participate in CR exercise, nutrition and lifestyle modification opportunities to decrease overall RF.   pt will participate in CR exercise, nutrition and lifestyle modification opportunities to decrease overall RF.           Core Components/Risk Factors/Patient Goals at Discharge (Final Review):  Goals and Risk Factor Review - 01/05/18 0704    Core Components/Risk Factors/Patient Goals Review          Personal Goals Review  Lipids;Hypertension;Stress;Weight Management/Obesity    Review  pt personal goals are to improve blood pressure, weight loss and improved diet plan. pt VSS during rehab sessions.     Expected Outcomes  pt will participate in CR exercise, nutrition and lifestyle modification opportunities to decrease overall RF.            ITP  Comments: ITP Comments    Row Name 12/08/17 0818 12/08/17 0831 12/12/17 1702 01/05/18 0703   ITP Comments  Dr. Armanda Magicraci Turner, Medical Director   Dr. Armanda Magicraci Turner, Medical Director   pt started group exercise sessions. pt tolerated light activity without difficulty. pt oriented to exercise equipment and safety routine.   30 day ITP review. pt with good attendance and participation.  pt demonstrates eagerness to participate in CR program.       Comments:

## 2018-01-06 ENCOUNTER — Encounter (HOSPITAL_COMMUNITY)
Admission: RE | Admit: 2018-01-06 | Discharge: 2018-01-06 | Disposition: A | Discharge status: Home / Self Care | Payer: BLUE CROSS/BLUE SHIELD | Source: Ambulatory Visit | Attending: Cardiology | Admitting: Cardiology

## 2018-01-06 DIAGNOSIS — Q2549 Other congenital malformations of aorta: Secondary | ICD-10-CM

## 2018-01-06 DIAGNOSIS — Z8679 Personal history of other diseases of the circulatory system: Secondary | ICD-10-CM

## 2018-01-06 DIAGNOSIS — Z9889 Other specified postprocedural states: Principal | ICD-10-CM

## 2018-01-09 ENCOUNTER — Encounter (HOSPITAL_COMMUNITY): Payer: BLUE CROSS/BLUE SHIELD

## 2018-01-09 ENCOUNTER — Encounter (HOSPITAL_COMMUNITY)
Admission: RE | Admit: 2018-01-09 | Discharge: 2018-01-09 | Disposition: A | Discharge status: Home / Self Care | Payer: BLUE CROSS/BLUE SHIELD | Source: Ambulatory Visit | Attending: Cardiology | Admitting: Cardiology

## 2018-01-09 DIAGNOSIS — Z9889 Other specified postprocedural states: Principal | ICD-10-CM

## 2018-01-09 DIAGNOSIS — Q2549 Other congenital malformations of aorta: Secondary | ICD-10-CM

## 2018-01-09 DIAGNOSIS — Z8679 Personal history of other diseases of the circulatory system: Secondary | ICD-10-CM

## 2018-01-10 ENCOUNTER — Encounter: Payer: Self-pay | Admitting: *Deleted

## 2018-01-10 LAB — BASIC METABOLIC PANEL
BUN/Creatinine Ratio: 13 (ref 9–20)
BUN: 17 mg/dL (ref 6–24)
CO2: 24 mmol/L (ref 20–29)
CREATININE: 1.27 mg/dL (ref 0.76–1.27)
Calcium: 9.2 mg/dL (ref 8.7–10.2)
Chloride: 101 mmol/L (ref 96–106)
GFR calc Af Amer: 76 mL/min/{1.73_m2} (ref >59)
GFR, EST NON AFRICAN AMERICAN: 66 mL/min/{1.73_m2} (ref >59)
Glucose: 113 mg/dL — ABNORMAL HIGH (ref 65–99)
POTASSIUM: 4.3 mmol/L (ref 3.5–5.2)
SODIUM: 138 mmol/L (ref 134–144)

## 2018-01-11 ENCOUNTER — Encounter (HOSPITAL_COMMUNITY): Payer: BLUE CROSS/BLUE SHIELD

## 2018-01-11 ENCOUNTER — Telehealth (HOSPITAL_COMMUNITY): Payer: Self-pay | Admitting: Internal Medicine

## 2018-01-13 ENCOUNTER — Encounter (HOSPITAL_COMMUNITY)
Admission: RE | Admit: 2018-01-13 | Discharge: 2018-01-13 | Disposition: A | Discharge status: Home / Self Care | Payer: BLUE CROSS/BLUE SHIELD | Source: Ambulatory Visit | Attending: Cardiology | Admitting: Cardiology

## 2018-01-13 DIAGNOSIS — Z9889 Other specified postprocedural states: Principal | ICD-10-CM

## 2018-01-13 DIAGNOSIS — Q2549 Other congenital malformations of aorta: Secondary | ICD-10-CM

## 2018-01-13 DIAGNOSIS — Z8679 Personal history of other diseases of the circulatory system: Secondary | ICD-10-CM

## 2018-01-16 ENCOUNTER — Encounter (HOSPITAL_COMMUNITY): Payer: BLUE CROSS/BLUE SHIELD

## 2018-01-16 ENCOUNTER — Other Ambulatory Visit: Payer: Self-pay | Admitting: Thoracic Surgery (Cardiothoracic Vascular Surgery)

## 2018-01-16 ENCOUNTER — Encounter (HOSPITAL_COMMUNITY)
Admission: RE | Admit: 2018-01-16 | Discharge: 2018-01-16 | Disposition: A | Discharge status: Home / Self Care | Payer: BLUE CROSS/BLUE SHIELD | Source: Ambulatory Visit | Attending: Cardiology | Admitting: Cardiology

## 2018-01-16 DIAGNOSIS — Q2549 Other congenital malformations of aorta: Secondary | ICD-10-CM

## 2018-01-16 DIAGNOSIS — Z9889 Other specified postprocedural states: Principal | ICD-10-CM

## 2018-01-16 DIAGNOSIS — Z8679 Personal history of other diseases of the circulatory system: Secondary | ICD-10-CM

## 2018-01-16 DIAGNOSIS — I712 Thoracic aortic aneurysm, without rupture, unspecified: Secondary | ICD-10-CM

## 2018-01-17 ENCOUNTER — Ambulatory Visit
Admission: RE | Admit: 2018-01-17 | Discharge: 2018-01-17 | Disposition: A | Discharge status: Home / Self Care | Payer: BLUE CROSS/BLUE SHIELD | Source: Ambulatory Visit | Attending: Thoracic Surgery (Cardiothoracic Vascular Surgery) | Admitting: Thoracic Surgery (Cardiothoracic Vascular Surgery)

## 2018-01-17 ENCOUNTER — Ambulatory Visit (INDEPENDENT_AMBULATORY_CARE_PROVIDER_SITE_OTHER): Payer: BLUE CROSS/BLUE SHIELD | Admitting: Thoracic Surgery (Cardiothoracic Vascular Surgery)

## 2018-01-17 ENCOUNTER — Other Ambulatory Visit: Payer: Self-pay

## 2018-01-17 ENCOUNTER — Encounter: Payer: Self-pay | Admitting: Thoracic Surgery (Cardiothoracic Vascular Surgery)

## 2018-01-17 VITALS — BP 127/80 | HR 62 | Resp 16 | Ht 74.0 in | Wt 288.0 lb

## 2018-01-17 DIAGNOSIS — Q2549 Other congenital malformations of aorta: Secondary | ICD-10-CM | POA: Diagnosis not present

## 2018-01-17 DIAGNOSIS — I712 Thoracic aortic aneurysm, without rupture, unspecified: Secondary | ICD-10-CM

## 2018-01-17 DIAGNOSIS — Z09 Encounter for follow-up examination after completed treatment for conditions other than malignant neoplasm: Secondary | ICD-10-CM | POA: Diagnosis not present

## 2018-01-17 DIAGNOSIS — Q2543 Congenital aneurysm of aorta: Secondary | ICD-10-CM

## 2018-01-17 NOTE — Progress Notes (Signed)
[address]       [name] [address]             [phone]     HPI: Matthew Hoffman returns for scheduled follow-up  Matthew Hoffman is a 52 year old gentleman with a history of hypertension, obstructive sleep apnea, arthritis, abdominal hernia repair, and hematuria.  He was evaluated for hematuria earlier this year.  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  He underwent repair the aneurysm with reimplantation of his native aortic valve on 09/08/2017.  Postoperatively he had atrial flutter.  He was started on Eliquis and had cardioversion.  He was in sinus rhythm at discharge.  He developed a superficial sternal wound dehiscence in April.  He was treated with Keflex and local wound care.  I last saw him in the office on 11/15/2017.  The wound was healed.  He saw Dr. Jens Som in June.  Amiodarone was discontinued.  He will see him again in September and consider discontinuing apixaban at that time.  He has been participating cardiac rehab.  That is going well.  He does still have some discomfort in his chest but is not requiring any pain medication.  He did have another episode of hematuria recently.  He had another CT scan at [facility] to evaluate that.  Past Medical History:  Diagnosis Date  . Arthritis   . Diverticulosis   . Hiatal hernia    small per ct 2016  . Hypertension   . Hypertrophy of prostate    mild  . OSA on CPAP    per pt moderate osa per study  . Sinus of Valsalva aneurysm   . Wears glasses     Current Outpatient Medications  Medication Sig Dispense Refill  . acetaminophen (TYLENOL) 325 MG tablet Take 2 tablets (650 mg total) by mouth every 6 (six) hours as needed for mild pain or moderate pain.    Marland Kitchen aspirin EC 81 MG tablet Take 81 mg by mouth daily.    Marland Kitchen ELIQUIS 5 MG TABS tablet TAKE 1 TABLET BY MOUTH TWICE DAILY 60 tablet 1  . EPINEPHrine (EPIPEN  2-PAK) 0.3 mg/0.3 mL IJ SOAJ injection Inject 0.3 mg into the muscle as needed (for allergic reaction).     . metoprolol tartrate (LOPRESSOR) 50 MG tablet Take 1 tablet (50 mg total) by mouth 2 (two) times daily. 60 tablet 1  . Multiple Vitamin (MULTIVITAMIN) tablet Take 1 tablet by mouth daily.    . valsartan-hydrochlorothiazide (DIOVAN-HCT) 320-12.5 MG tablet Take 1 tablet by mouth daily. 90 tablet 3   No current facility-administered medications for this visit.     Physical Exam BP 127/80 (BP Location: Right Arm, Patient Position: Sitting, Cuff Size: Large)   Pulse 62   Resp 16   Ht 6\' 2"  (1.88 m)   Wt 288 lb (130.6 kg)   SpO2 98% Comment: ON RA  BMI 36.56 kg/m  52 year old man in no acute distress Alert and oriented x3 with no focal deficits Cardiac regular rate and rhythm normal S1 and S2, no murmur Lungs clear with equal breath sounds bilaterally Sternal incision well-healed, sternum stable.  Diagnostic Tests: CHEST - 2 VIEW  COMPARISON:  Two-view chest x-ray 10/11/2017  FINDINGS: The heart size is normal. There is no edema or effusion. No focal airspace disease is present. Lung volumes are low. Patient  is status post median sternotomy. Postsurgical changes are noted at the left scapula.  IMPRESSION: No acute cardiopulmonary disease.  Similar appearance of low lung volumes.   Electronically Signed   By: Marin Robertshristopher  Mattern M.D.   On: 01/17/2018 10:39  I personally reviewed the chest x-ray images and concur with the findings noted above.  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  He had repair of that on 09/08/2017.  We were able to preserve his native aortic valve.  He developed a superficial wound infection that responded to antibiotics and local wound care.  That has healed.  He currently is doing well with good exercise tolerance.  He had postoperative atrial  flutter.  He was cardioverted back to sinus rhythm.  He was discharged on amiodarone and Eliquis.  The amiodarone has now been discontinued.  Dr. Ludwig Clarksrenshaw's can see him back in September and assess whether it safe to stop the apixaban.  Hematuria-had another episode recently.  [facility]  He was told that that did not show any pathology.  Plan: Follow up as scheduled with Dr. Jens Somrenshaw.  I will be happy to see Matthew Hoffman back anytime the future if I can be of any further assistance with his care  Loreli SlotSteven C Shawnika Pepin, MD [facility] [phone]

## 2018-01-18 ENCOUNTER — Encounter (HOSPITAL_COMMUNITY)
Admission: RE | Admit: 2018-01-18 | Discharge: 2018-01-18 | Disposition: A | Discharge status: Home / Self Care | Payer: BLUE CROSS/BLUE SHIELD | Source: Ambulatory Visit | Attending: Cardiology | Admitting: Cardiology

## 2018-01-18 ENCOUNTER — Encounter (HOSPITAL_COMMUNITY): Payer: BLUE CROSS/BLUE SHIELD

## 2018-01-18 DIAGNOSIS — Z9889 Other specified postprocedural states: Principal | ICD-10-CM

## 2018-01-18 DIAGNOSIS — Z8679 Personal history of other diseases of the circulatory system: Secondary | ICD-10-CM

## 2018-01-18 DIAGNOSIS — Q2549 Other congenital malformations of aorta: Secondary | ICD-10-CM

## 2018-01-20 ENCOUNTER — Encounter (HOSPITAL_COMMUNITY)
Admission: RE | Admit: 2018-01-20 | Discharge: 2018-01-20 | Disposition: A | Discharge status: Home / Self Care | Payer: BLUE CROSS/BLUE SHIELD | Source: Ambulatory Visit | Attending: Cardiology | Admitting: Cardiology

## 2018-01-20 DIAGNOSIS — Q2549 Other congenital malformations of aorta: Secondary | ICD-10-CM | POA: Diagnosis not present

## 2018-01-20 DIAGNOSIS — Z9889 Other specified postprocedural states: Principal | ICD-10-CM

## 2018-01-20 DIAGNOSIS — Z8679 Personal history of other diseases of the circulatory system: Secondary | ICD-10-CM

## 2018-01-22 ENCOUNTER — Other Ambulatory Visit: Payer: Self-pay | Admitting: Cardiology

## 2018-01-22 ENCOUNTER — Other Ambulatory Visit: Payer: Self-pay | Admitting: Adult Health

## 2018-01-23 ENCOUNTER — Encounter (HOSPITAL_COMMUNITY): Payer: BLUE CROSS/BLUE SHIELD

## 2018-01-23 ENCOUNTER — Encounter (HOSPITAL_COMMUNITY)
Admission: RE | Admit: 2018-01-23 | Discharge: 2018-01-23 | Disposition: A | Discharge status: Home / Self Care | Payer: BLUE CROSS/BLUE SHIELD | Source: Ambulatory Visit | Attending: Cardiology | Admitting: Cardiology

## 2018-01-23 VITALS — Ht 74.0 in | Wt 292.8 lb

## 2018-01-23 DIAGNOSIS — Q2549 Other congenital malformations of aorta: Secondary | ICD-10-CM

## 2018-01-23 DIAGNOSIS — Z9889 Other specified postprocedural states: Principal | ICD-10-CM

## 2018-01-23 DIAGNOSIS — Z8679 Personal history of other diseases of the circulatory system: Secondary | ICD-10-CM

## 2018-01-25 ENCOUNTER — Encounter (HOSPITAL_COMMUNITY): Payer: BLUE CROSS/BLUE SHIELD

## 2018-01-27 ENCOUNTER — Encounter (HOSPITAL_COMMUNITY): Payer: Self-pay

## 2018-01-27 ENCOUNTER — Encounter (HOSPITAL_COMMUNITY)
Admission: RE | Admit: 2018-01-27 | Discharge: 2018-01-27 | Disposition: A | Discharge status: Home / Self Care | Payer: BLUE CROSS/BLUE SHIELD | Source: Ambulatory Visit | Attending: Cardiology | Admitting: Cardiology

## 2018-01-27 DIAGNOSIS — Z79899 Other long term (current) drug therapy: Secondary | ICD-10-CM | POA: Diagnosis not present

## 2018-01-27 DIAGNOSIS — Q2549 Other congenital malformations of aorta: Secondary | ICD-10-CM | POA: Diagnosis present

## 2018-01-27 DIAGNOSIS — I10 Essential (primary) hypertension: Secondary | ICD-10-CM | POA: Insufficient documentation

## 2018-01-27 DIAGNOSIS — Z87891 Personal history of nicotine dependence: Secondary | ICD-10-CM | POA: Insufficient documentation

## 2018-01-27 DIAGNOSIS — Z7901 Long term (current) use of anticoagulants: Secondary | ICD-10-CM | POA: Diagnosis not present

## 2018-01-27 DIAGNOSIS — Z9889 Other specified postprocedural states: Secondary | ICD-10-CM | POA: Diagnosis not present

## 2018-01-27 DIAGNOSIS — Z7982 Long term (current) use of aspirin: Secondary | ICD-10-CM | POA: Diagnosis not present

## 2018-01-27 DIAGNOSIS — G4733 Obstructive sleep apnea (adult) (pediatric): Secondary | ICD-10-CM | POA: Insufficient documentation

## 2018-01-27 DIAGNOSIS — Z8679 Personal history of other diseases of the circulatory system: Secondary | ICD-10-CM

## 2018-01-30 ENCOUNTER — Encounter (HOSPITAL_COMMUNITY): Payer: BLUE CROSS/BLUE SHIELD

## 2018-02-01 ENCOUNTER — Encounter (HOSPITAL_COMMUNITY): Payer: BLUE CROSS/BLUE SHIELD

## 2018-02-06 ENCOUNTER — Encounter (HOSPITAL_COMMUNITY): Payer: BLUE CROSS/BLUE SHIELD

## 2018-02-08 ENCOUNTER — Encounter (HOSPITAL_COMMUNITY): Payer: BLUE CROSS/BLUE SHIELD

## 2018-02-13 ENCOUNTER — Encounter (HOSPITAL_COMMUNITY): Payer: BLUE CROSS/BLUE SHIELD

## 2018-02-15 ENCOUNTER — Encounter (HOSPITAL_COMMUNITY): Payer: BLUE CROSS/BLUE SHIELD

## 2018-02-20 ENCOUNTER — Encounter (HOSPITAL_COMMUNITY): Payer: BLUE CROSS/BLUE SHIELD

## 2018-02-22 ENCOUNTER — Encounter (HOSPITAL_COMMUNITY): Payer: BLUE CROSS/BLUE SHIELD

## 2018-03-01 ENCOUNTER — Encounter (HOSPITAL_COMMUNITY): Payer: BLUE CROSS/BLUE SHIELD

## 2018-03-02 NOTE — Progress Notes (Signed)
Discharge Progress Report  Patient Details  Name: Matthew Hoffman MRN: [id number] Date of Birth: 1966-04-08 Referring Provider:   Flowsheet Row CARDIAC REHAB PHASE II ORIENTATION from 12/08/2017 in [place]  Referring Provider  Lelon Perla MD        Number of Visits: 20  Reason for Discharge:  Patient has met program and personal goals.  Smoking History:  Social History   Tobacco Use  Smoking Status Former Smoker  . Years: 0.00  . 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  ADL UCSD:   Initial Exercise Prescription: Initial Exercise Prescription - 12/08/17 1000    Date of Initial Exercise RX and Referring Provider          Date  12/08/17    Referring Provider  Lelon Perla MD         Treadmill          MPH  3    Grade  0    Minutes  10    METs  3.3        Bike          Level  1.5    Minutes  10    METs  3.18        NuStep          Level  3    SPM  85    Minutes  10    METs  3        Prescription Details          Frequency (times per week)  3x    Duration  Progress to 30 minutes of continuous aerobic without signs/symptoms of physical distress        Intensity          THRR 40-80% of Max Heartrate  68-137    Ratings of Perceived Exertion  11-13    Perceived Dyspnea  0-4        Progression          Progression  Continue progressive overload as per policy without signs/symptoms or physical distress.        Resistance Training          Training Prescription  Yes    Weight  5lbs    Reps  10-15           Discharge Exercise Prescription (Final Exercise Prescription Changes): Exercise Prescription Changes - 01/27/18 1200    Response to Exercise          Blood Pressure (Admit)  130/78    Blood Pressure (Exercise)  128/80    Blood Pressure (Exit)  120/84     Heart Rate (Admit)  68 bpm    Heart Rate (Exercise)  85 bpm    Heart Rate (Exit)  68 bpm    Rating of Perceived Exertion (Exercise)  11    Symptoms  none     Duration  Continue with 30 min of aerobic exercise without signs/symptoms of physical distress.    Intensity  THRR unchanged        Progression          Progression  Continue to progress workloads to maintain intensity without signs/symptoms of physical distress.    Average METs  4.7        Resistance Training  Training Prescription  Yes    Weight  7lbs    Reps  10-15    Time  10 Minutes        Treadmill          MPH  3.5    Grade  3    Minutes  10    METs  5.13        NuStep          Level  5    SPM  100    Minutes  10    METs  3.5        Elliptical          Level  2    Speed  1    Minutes  10    METs  5.5        Home Exercise Plan          Plans to continue exercise at  [employer] (comment)   [employer] or [employer]   Frequency  Add 2 additional days to program exercise sessions.    Initial Home Exercises Provided  12/19/17           Functional Capacity: 6 Minute Walk    6 Minute Walk    Row Name 12/08/17 1015 01/23/18 1143   Phase  Initial  Discharge   Distance  1600 feet  2347 feet   Distance % Change  no documentation  46.69 %   Distance Feet Change  no documentation  747 ft   Walk Time  6 minutes  6 minutes   # of Rest Breaks  0  0   MPH  3.03  4.4   METS  3.69  5.3   RPE  11  11   Perceived Dyspnea   0  0   VO2 Peak  12.9  18.4   Symptoms  No  No   Resting HR  49 bpm  51 bpm   Resting BP  128/78  116/80   Resting Oxygen Saturation   97 %  no documentation   Exercise Oxygen Saturation  during 6 min walk  98 %  no documentation   Max Ex. HR  64 bpm  89 bpm   Max Ex. BP  140/82  146/92   2 Minute Post BP  122/84  126/90          Psychological, QOL, Others - Outcomes: PHQ 2/9: Depression screen [facility] 2/9 01/27/2018 12/12/2017  Decreased Interest 0 0  Down,  Depressed, Hopeless 0 0  PHQ - 2 Score 0 0    Quality of Life: Quality of Life - 01/27/18 1501    Quality of Life          Select  Quality of Life        Quality of Life Scores          Health/Function Pre  24.87 %    Health/Function Post  26.13 %    Health/Function % Change  5.07 %    Socioeconomic Pre  27.14 %    Socioeconomic Post  26.14 %    Socioeconomic % Change   -3.68 %    Psych/Spiritual Pre  29.64 %    Psych/Spiritual Post  30 %    Psych/Spiritual % Change  1.21 %    Family Pre  26.38 %    Family Post  23.4 %    Family % Change  -11.3 %    GLOBAL Pre  26.55 %  GLOBAL Post  26.53 %    GLOBAL % Change  -0.08 %           Personal Goals: Goals established at orientation with interventions provided to work toward goal. Personal Goals and Risk Factors at Admission - 12/08/17 1000    Core Components/Risk Factors/Patient Goals on Admission           Weight Management  Yes;Weight Loss    Intervention  Weight Management: Develop a combined nutrition and exercise program designed to reach desired caloric intake, while maintaining appropriate intake of nutrient and fiber, sodium and fats, and appropriate energy expenditure required for the weight goal.;Weight Management/Obesity: Establish reasonable short term and long term weight goals.;Weight Management: Provide education and appropriate resources to help participant work on and attain dietary goals.    Admit Weight  288 lb 9.3 oz (130.9 kg)    Goal Weight: Short Term  285 lb (129.3 kg)    Goal Weight: Long Term  278 lb (126.1 kg)    Expected Outcomes  Short Term: Continue to assess and modify interventions until short term weight is achieved;Long Term: Adherence to nutrition and physical activity/exercise program aimed toward attainment of established weight goal;Weight Loss: Understanding of general recommendations for a balanced deficit meal plan, which promotes 1-2 lb weight loss per week and includes a negative  energy balance of [phone] kcal/d;Understanding recommendations for meals to include 15-35% energy as protein, 25-35% energy from fat, 35-60% energy from carbohydrates, less than 264m of dietary cholesterol, 20-35 gm of total fiber daily;Understanding of distribution of calorie intake throughout the day with the consumption of 4-5 meals/snacks    Hypertension  Yes    Intervention  Provide education on lifestyle modifcations including regular physical activity/exercise, weight management, moderate sodium restriction and increased consumption of fresh fruit, vegetables, and low fat dairy, alcohol moderation, and smoking cessation.;Monitor prescription use compliance.    Expected Outcomes  Long Term: Maintenance of blood pressure at goal levels.;Short Term: Continued assessment and intervention until BP is < 140/926mHG in hypertensive participants. < 130/8056mG in hypertensive participants with diabetes, heart failure or chronic kidney disease.    Stress  Yes    Intervention  Offer individual and/or small group education and counseling on adjustment to heart disease, stress management and health-related lifestyle change. Teach and support self-help strategies.;Refer participants experiencing significant psychosocial distress to appropriate mental health specialists for further evaluation and treatment. When possible, include family members and significant others in education/counseling sessions.    Expected Outcomes  Short Term: Participant demonstrates changes in health-related behavior, relaxation and other stress management skills, ability to obtain effective social support, and compliance with psychotropic medications if prescribed.;Long Term: Emotional wellbeing is indicated by absence of clinically significant psychosocial distress or social isolation.            Personal Goals Discharge: Goals and Risk Factor Review    Core Components/Risk Factors/Patient Goals Review    Row Name 12/12/17 1704  01/05/18 0704 01/27/18 0915   Personal Goals Review  Lipids;Hypertension;Stress;Weight Management/Obesity  Lipids;Hypertension;Stress;Weight Management/Obesity  Lipids;Hypertension;Stress;Weight Management/Obesity   Review  pt with multiple CAD RF demonstrates willingness to participate in CR activities.pt personal goals are to improve blood pressure, weight loss and improved diet plan. pt works as autSurveyor, quantityt enjoys reading, relaxing and playing pool with friends.   pt personal goals are to improve blood pressure, weight loss and improved diet plan. pt VSS during rehab sessions.   pt personal goals are to improve  blood pressure, weight loss and improved diet plan. pt VSS during rehab sessions. pt has developed improved eating habits with continued weight loss efforts. pt feels confident with aerobic exercise capacity and safety guidelines with more focus towards healthy exercise habits.    Expected Outcomes  pt will participate in CR exercise, nutrition and lifestyle modification opportunities to decrease overall RF.   pt will participate in CR exercise, nutrition and lifestyle modification opportunities to decrease overall RF.   pt will continue exercise, nutrition and lifestyle modification opportunities in the community to decrease overall RF.           Exercise Goals and Review: Exercise Goals    Exercise Goals    Row Name 12/08/17 1018   Increase Physical Activity  Yes   Intervention  Provide advice, education, support and counseling about physical activity/exercise needs.;Develop an individualized exercise prescription for aerobic and resistive training based on initial evaluation findings, risk stratification, comorbidities and participant's personal goals.   Expected Outcomes  Short Term: Attend rehab on a regular basis to increase amount of physical activity.;Long Term: Add in home exercise to make exercise part of routine and to increase amount of physical activity.;Long Term:  Exercising regularly at least 3-5 days a week.   Increase Strength and Stamina  Yes   Intervention  Provide advice, education, support and counseling about physical activity/exercise needs.;Develop an individualized exercise prescription for aerobic and resistive training based on initial evaluation findings, risk stratification, comorbidities and participant's personal goals.   Expected Outcomes  Short Term: Increase workloads from initial exercise prescription for resistance, speed, and METs.;Short Term: Perform resistance training exercises routinely during rehab and add in resistance training at home;Long Term: Improve cardiorespiratory fitness, muscular endurance and strength as measured by increased METs and functional capacity (6MWT)   Able to understand and use rate of perceived exertion (RPE) scale  Yes   Intervention  Provide education and explanation on how to use RPE scale   Expected Outcomes  Short Term: Able to use RPE daily in rehab to express subjective intensity level;Long Term:  Able to use RPE to guide intensity level when exercising independently   Knowledge and understanding of Target Heart Rate Range (THRR)  Yes   Intervention  Provide education and explanation of THRR including how the numbers were predicted and where they are located for reference   Expected Outcomes  Short Term: Able to state/look up THRR;Short Term: Able to use daily as guideline for intensity in rehab;Long Term: Able to use THRR to govern intensity when exercising independently   Able to check pulse independently  Yes   Intervention  Provide education and demonstration on how to check pulse in carotid and radial arteries.;Review the importance of being able to check your own pulse for safety during independent exercise   Expected Outcomes  Short Term: Able to explain why pulse checking is important during independent exercise;Long Term: Able to check pulse independently and accurately   Understanding of  Exercise Prescription  Yes   Intervention  Provide education, explanation, and written materials on patient's individual exercise prescription   Expected Outcomes  Short Term: Able to explain program exercise prescription;Long Term: Able to explain home exercise prescription to exercise independently          Nutrition & Weight - Outcomes: Pre Biometrics - 01/23/18 1152    Pre Biometrics          Height  6' 2"  (1.88 m)    Weight  132.8 kg  Waist Circumference  48 inches    Hip Circumference  49.25 inches    Waist to Hip Ratio  0.97 %    BMI (Calculated)  37.57    Triceps Skinfold  11 mm    % Body Fat  32.3 %    Grip Strength  64.5 kg    Flexibility  14 in    Single Leg Stand  30 seconds            Nutrition: Nutrition Therapy & Goals - 02/10/18 0928    Nutrition Therapy          Diet  heart healthy        Personal Nutrition Goals          Nutrition Goal  Pt to identify and limit food sources of saturated fat, trans fat, and sodium        Intervention Plan          Intervention  Prescribe, educate and counsel regarding individualized specific dietary modifications aiming towards targeted core components such as weight, hypertension, lipid management, diabetes, heart failure and other comorbidities.    Expected Outcomes  Short Term Goal: Understand basic principles of dietary content, such as calories, fat, sodium, cholesterol and nutrients.           Nutrition Discharge: Nutrition Assessments - 02/10/18 0927    MEDFICTS Scores          Pre Score  65    Post Score  12    Score Difference  -53           Education Questionnaire Score: Knowledge Questionnaire Score - 01/27/18 1235    Knowledge Questionnaire Score          Post Score  21/24           Goals reviewed with patient; copy given to patient.

## 2018-03-02 NOTE — Addendum Note (Signed)
Encounter addended by: Robyne Peers, RN on: 03/02/2018 7:30 AM  Actions taken: Sign clinical note, Episode resolved

## 2018-03-06 ENCOUNTER — Encounter (HOSPITAL_COMMUNITY): Payer: BLUE CROSS/BLUE SHIELD

## 2018-03-08 ENCOUNTER — Encounter (HOSPITAL_COMMUNITY): Payer: BLUE CROSS/BLUE SHIELD

## 2018-03-20 NOTE — Progress Notes (Signed)
HPI: Follow-up thoracic aortic aneurysm.  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  Preoperative echocardiogram showed normal LV function, moderate diastolic dysfunction, aortic aneurysm with mild aortic insufficiency and moderate left atrial enlargement.  Cardiac catheterization preoperatively showed no coronary disease.  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  Preoperative carotid Dopplers showed no significant stenosis.  On March 14 patient had repair of sinus of Valsalva aneurysm using a graft with resuspension of aortic valve.  Postoperative course complicated by atrial flutter and patient had TEE guided cardioversion.  Patient had dehiscence of his upper portion of the sternal wound and was treated with Keflex. Since last seen the patient denies any dyspnea on exertion, orthopnea, PND, pedal edema, palpitations, syncope or chest pain.   Current Outpatient Medications  Medication Sig Dispense Refill  . acetaminophen (TYLENOL) 325 MG tablet Take 2 tablets (650 mg total) by mouth every 6 (six) hours as needed for mild pain or moderate pain.    Marland Kitchen. aspirin EC 81 MG tablet Take 81 mg by mouth daily.    Marland Kitchen. ELIQUIS 5 MG TABS tablet TAKE 1 TABLET BY MOUTH TWICE DAILY 180 tablet 1  . EPINEPHrine (EPIPEN 2-PAK) 0.3 mg/0.3 mL IJ SOAJ injection Inject 0.3 mg into the muscle as needed (for allergic reaction).     . metoprolol tartrate (LOPRESSOR) 50 MG tablet TAKE 1 TABLET BY MOUTH TWICE DAILY 60 tablet 3  . Multiple Vitamin (MULTIVITAMIN) tablet Take 1 tablet by mouth daily.    . valsartan-hydrochlorothiazide (DIOVAN-HCT) 320-12.5 MG tablet Take 1 tablet by mouth daily. 90 tablet 3   No current facility-administered medications for this visit.      Past Medical History:  Diagnosis Date  . Arthritis   . Diverticulosis   . Hiatal hernia    small per ct 2016  . Hypertension   . Hypertrophy of prostate    mild  . OSA on  CPAP    per pt moderate osa per study  . Sinus of Valsalva aneurysm   . Wears glasses     Past Surgical History:  Procedure Laterality Date  . CARDIOVERSION N/A 09/15/2017   Procedure: CARDIOVERSION;  Surgeon: Wendall StadeNishan, Peter C, MD;  [facility];  Service: Cardiovascular;  Laterality: N/A;  . COLON RESECTION    . ELBOW SURGERY Right 1999  . KNEE ARTHROSCOPY Left 1995  . RIGHT/LEFT HEART CATH AND CORONARY ANGIOGRAPHY N/A 08/24/2017   Procedure: RIGHT/LEFT HEART CATH AND CORONARY ANGIOGRAPHY;  Surgeon: Kathleene HazelMcAlhany, Christopher D, MD;  Location: [facility] INVASIVE CV LAB;  Service: Cardiovascular;  Laterality: N/A;  . SHOULDER SURGERY Bilateral right 1992/  left 1987  . TEE WITHOUT CARDIOVERSION N/A 09/08/2017   Procedure: TRANSESOPHAGEAL ECHOCARDIOGRAM (TEE);  Surgeon: Loreli SlotHendrickson, Steven C, MD;  [facility];  Service: Open Heart Surgery;  Laterality: N/A;  . TEE WITHOUT CARDIOVERSION N/A 09/15/2017   Procedure: TRANSESOPHAGEAL ECHOCARDIOGRAM (TEE);  Surgeon: Wendall StadeNishan, Peter C, MD;  [facility];  Service: Cardiovascular;  Laterality: N/A;  . THORACIC AORTIC ANEURYSM REPAIR N/A 09/08/2017   Procedure: REPAIR OF SINUS OF VALSALVA ANEURYSM;  Surgeon: Loreli SlotHendrickson, Steven C, MD;  [facility];  Service: Open Heart Surgery;  Laterality: N/A;  Using 30mm Valsalva Gelweave Graft  . UMBILICAL HERNIA REPAIR  10/2015  . VASECTOMY Bilateral 06/10/2016   Procedure: VASECTOMY;  Surgeon: Malen GauzePatrick L McKenzie, MD;  [facility] [place];  Service: Urology;  Laterality:  Bilateral;    Social History   Socioeconomic History  . Marital status: Divorced    Spouse name: Not on file  . Number of children: 2  . Years of education: Not on file  . Highest education level: Not on file  Occupational History    Comment: Counseling  Social Needs  . Financial resource strain: Not on file  . Food insecurity:    Worry: Not on file    Inability: Not on file  . Transportation needs:     Medical: Not on file    Non-medical: Not on file  Tobacco Use  . Smoking status: Former Smoker    Years: 0.00    Types: Cigars  . Smokeless tobacco: Never Used  . Tobacco comment: average cigar 2 per month  Substance and Sexual Activity  . Alcohol use: Yes    Comment: OCCASIONAL  . Drug use: No  . Sexual activity: Not on file  Lifestyle  . Physical activity:    Days per week: Not on file    Minutes per session: Not on file  . Stress: Not on file  Relationships  . Social connections:    Talks on phone: Not on file    Gets together: Not on file    Attends religious service: Not on file    Active member of club or organization: Not on file    Attends meetings of clubs or organizations: Not on file    Relationship status: Not on file  . Intimate partner violence:    Fear of current or ex partner: Not on file    Emotionally abused: Not on file    Physically abused: Not on file    Forced sexual activity: Not on file  Other Topics Concern  . Not on file  Social History Narrative  . Not on file    Family History  Problem Relation Age of Onset  . Hypertension Mother   . COPD Father     ROS: no fevers or chills, productive cough, hemoptysis, dysphasia, odynophagia, melena, hematochezia, dysuria, hematuria, rash, seizure activity, orthopnea, PND, pedal edema, claudication. Remaining systems are negative.  Physical Exam: Well-developed well-nourished in no acute distress.  Skin is warm and dry.  HEENT is normal.  Neck is supple.  Chest is clear to auscultation with normal expansion.  Cardiovascular exam is regular rate and rhythm.  Abdominal exam nontender or distended. No masses palpated. Extremities show no edema. neuro grossly intact  ECG-normal sinus rhythm at a rate of 74.  Diffuse nonspecific T wave changes.  Cannot rule out prior inferior infarct.  Personally reviewed  A/P  1 thoracic aortic aneurysm-status post repair.  Doing well and will plan follow-up CTAs  in the future.  2 postoperative atrial flutter-there has been no documentation of recurrence following discontinuation of amiodarone.  Will discontinue apixaban.  3 hypertension-blood pressure appears to be controlled today.  We will continue present medications and follow.  4 hematuria-as outlined in previous notes he is following with urology  Olga Millers, MD

## 2018-03-27 ENCOUNTER — Ambulatory Visit (INDEPENDENT_AMBULATORY_CARE_PROVIDER_SITE_OTHER): Payer: BLUE CROSS/BLUE SHIELD | Admitting: Cardiology

## 2018-03-27 ENCOUNTER — Encounter: Payer: Self-pay | Admitting: Cardiology

## 2018-03-27 VITALS — BP 132/84 | HR 74 | Ht 74.0 in | Wt 297.0 lb

## 2018-03-27 DIAGNOSIS — I10 Essential (primary) hypertension: Secondary | ICD-10-CM

## 2018-03-27 DIAGNOSIS — I712 Thoracic aortic aneurysm, without rupture, unspecified: Secondary | ICD-10-CM

## 2018-03-27 DIAGNOSIS — I483 Typical atrial flutter: Secondary | ICD-10-CM | POA: Diagnosis not present

## 2018-03-27 NOTE — Patient Instructions (Signed)
Medication Instructions:   STOP ELIQUIS  Follow-Up:  Your physician wants you to follow-up in: ONE YEAR WITH DR Shelda Pal will receive a reminder letter in the mail two months in advance. If you don't receive a letter, please call our office to schedule the follow-up appointment.   If you need a refill on your cardiac medications before your next appointment, please call your pharmacy.

## 2018-04-01 IMAGING — CR DG CHEST 2V
2 series · 2 of 2 positions shown · non-contrast
Comparison: 08/14/2017

CLINICAL DATA: Mid upper chest pain

EXAM:
CHEST  2 VIEW

[chest pa]
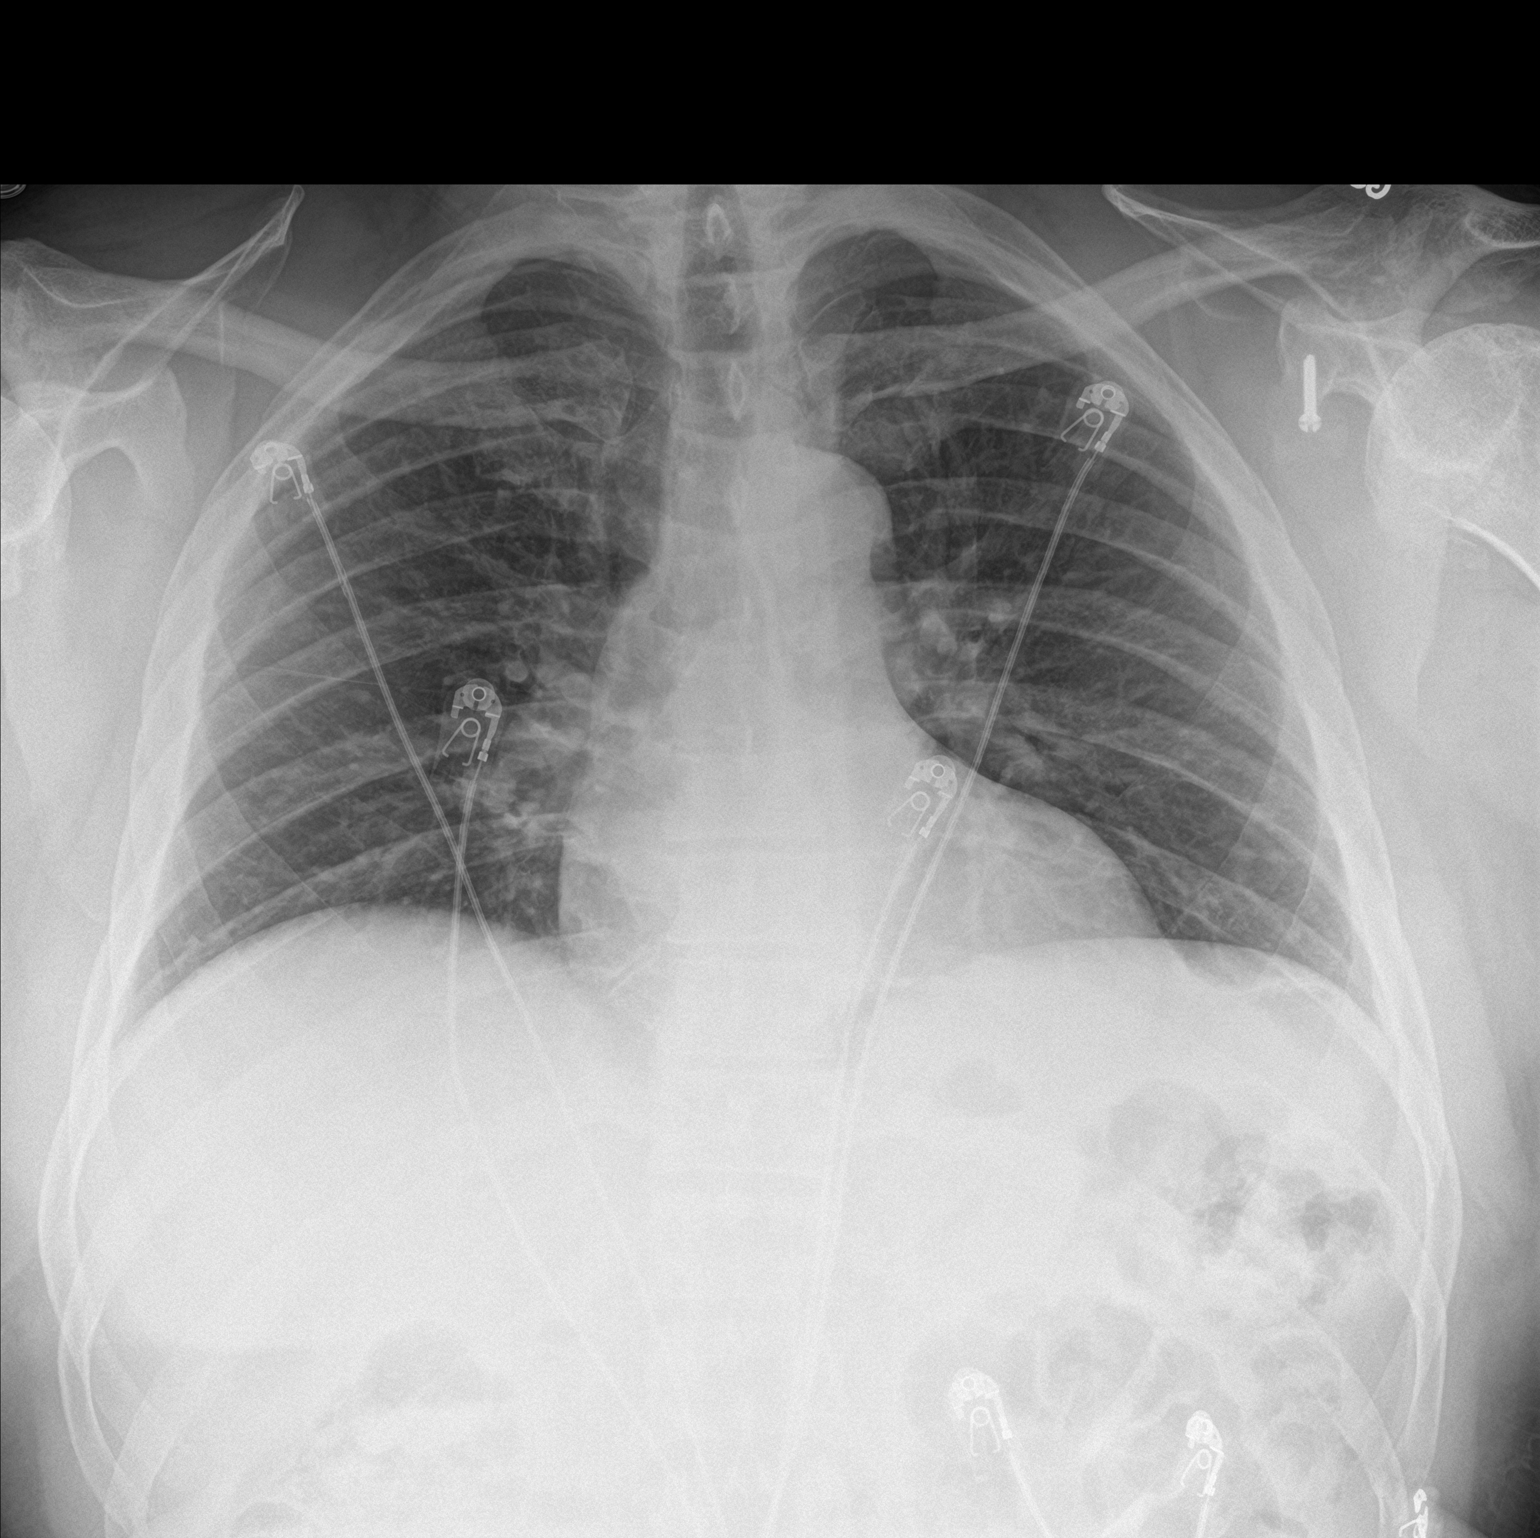

[chest lat]
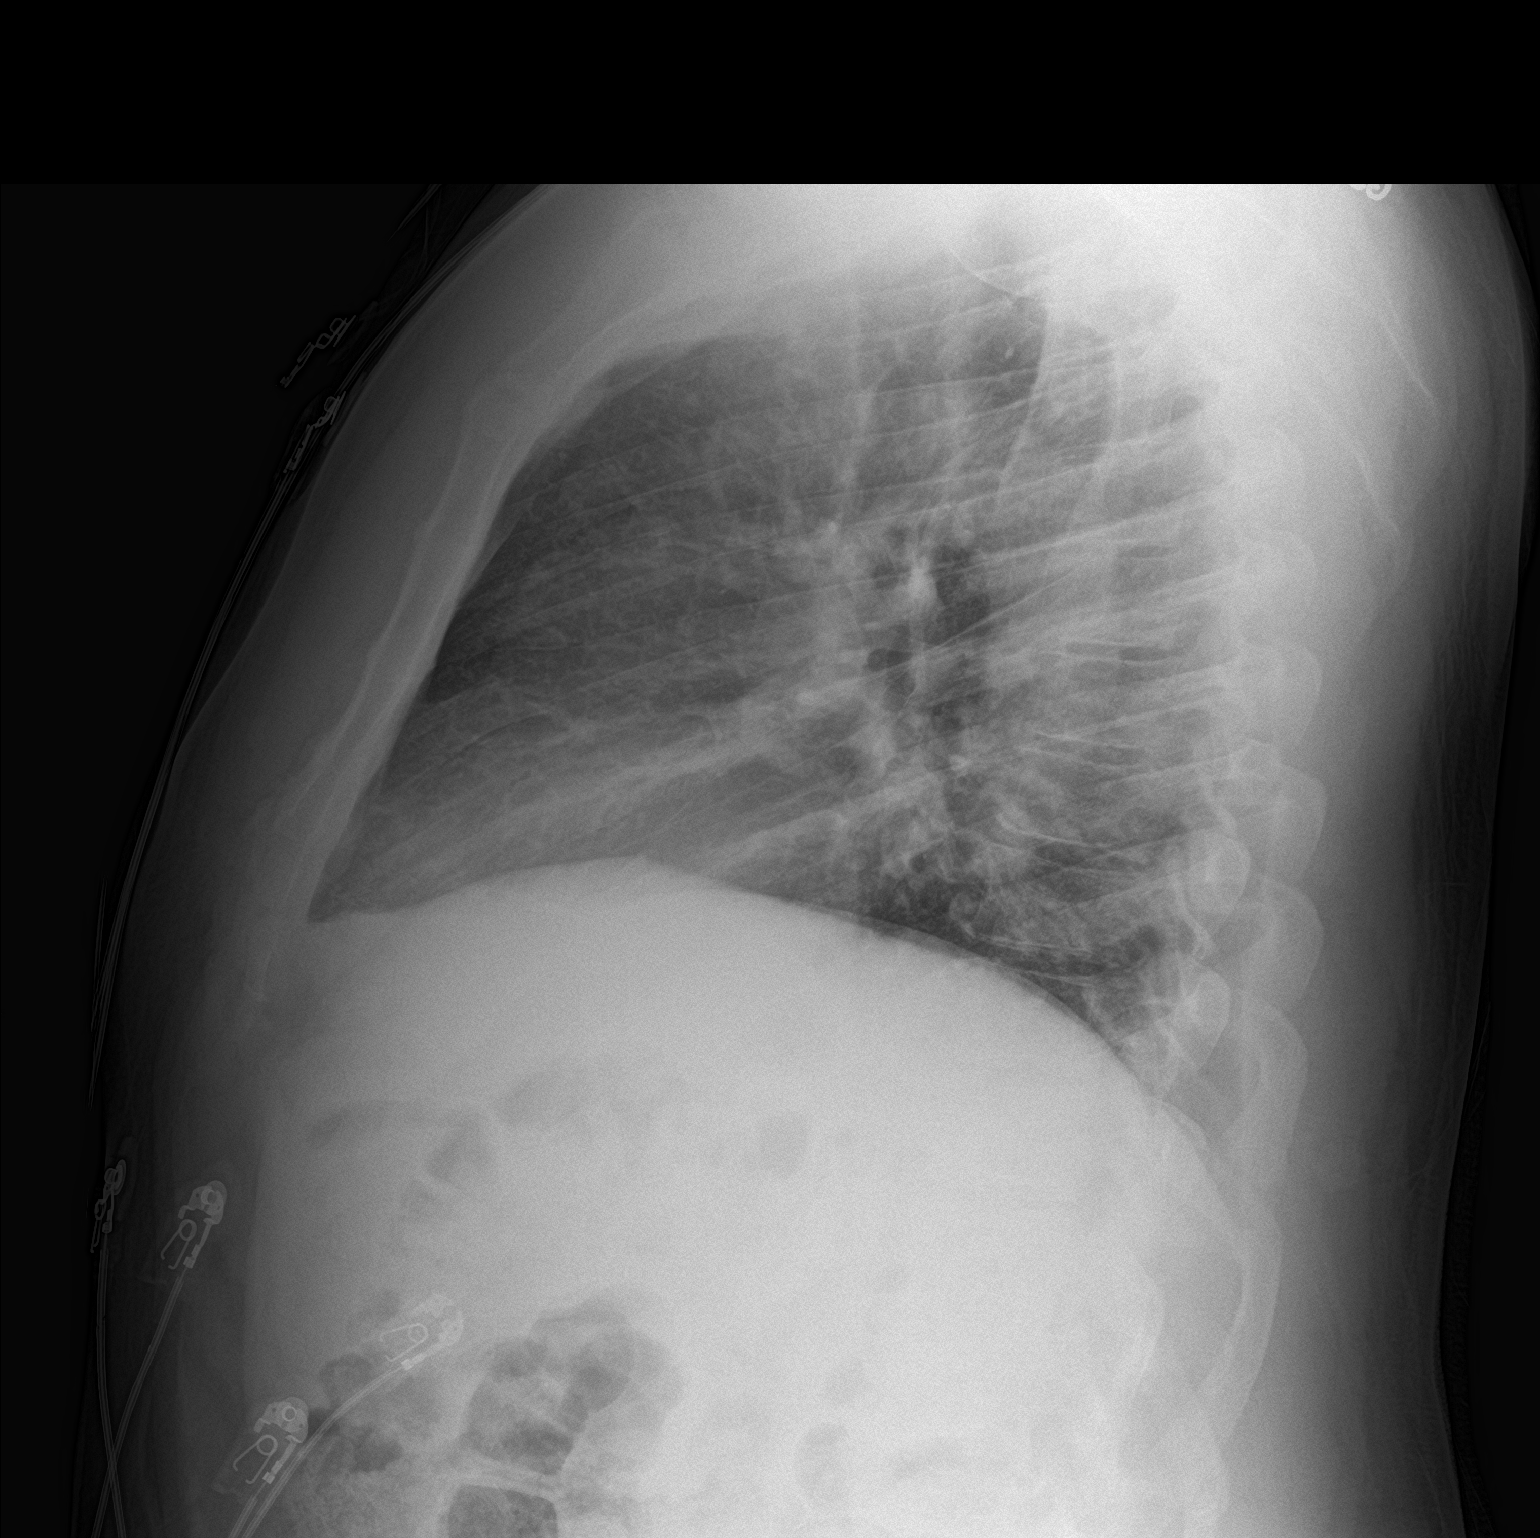

[2 of 2 positions shown; findings below may reference images not displayed]

FINDINGS: Heart and mediastinal contours are within normal limits. No focal
opacities or effusions. No acute bony abnormality.
IMPRESSION: No active cardiopulmonary disease.

## 2018-04-20 ENCOUNTER — Telehealth: Payer: Self-pay

## 2018-04-20 NOTE — Telephone Encounter (Signed)
   [facility] Pre-operative Risk Assessment    Request for surgical clearance:  1. What type of surgery is being performed? Colonoscopy   2. When is this surgery scheduled? 06/16/18   3. What type of clearance is required (medical clearance vs. Pharmacy clearance to hold med vs. Both)? Medical secondary to h/o post op AFIB   4. Are there any medications that need to be held prior to surgery and how long? None noted but on ASA   5. Practice name and name of physician performing surgery? Dr. Watt Climes with [facility]   6. What is your office phone number [phone]    7.   What is your office fax number [phone]  8.   Anesthesia type (None, local, MAC, general) ?

## 2018-04-21 NOTE — Telephone Encounter (Signed)
   Primary Cardiologist: Olga Millers, MD  Chart reviewed as part of pre-operative protocol coverage. Dr. Alphonsa Gin, can you provide clearance? You recently saw the patient. Please forward your response to P CV DIV PREOP.   Thank you  Manson Passey, PA 04/21/2018, 12:26 PM

## 2018-04-23 NOTE — Telephone Encounter (Signed)
Ok for colonoscopy; can hold ASA if needed  Matthew Hoffman

## 2018-05-28 HISTORY — PX: OTHER SURGICAL HISTORY: SHX169

## 2018-08-14 ENCOUNTER — Telehealth: Payer: Self-pay | Admitting: Cardiology

## 2018-08-14 NOTE — Telephone Encounter (Signed)
LMTCB 2/17 

## 2018-08-14 NOTE — Telephone Encounter (Signed)
Pt c/o BP issue: STAT if pt c/o blurred vision, one-sided weakness or slurred speech  1. What are your last 5 BP readings? 140/90      141/101      135/89  2. Are you having any other symptoms (ex. Dizziness, headache, blurred vision, passed out)? no  3. What is your BP issue? Would like to talk to Matthew Hoffman about his BP still being so high. He is also having some other issue as well,  he states he wasn't having these issue with is other BP medication.  He states that he has lost weight and is exercising.

## 2018-08-15 NOTE — Telephone Encounter (Signed)
Left message for pt to call.

## 2018-08-16 ENCOUNTER — Telehealth: Payer: Self-pay | Admitting: Cardiology

## 2018-08-16 NOTE — Telephone Encounter (Signed)
Dc metoprolol, cardizem cd 120 mg daily and follow bp Olga Millers

## 2018-08-16 NOTE — Telephone Encounter (Signed)
Called patient back, he states that he would like to know if Dr.Crenshaw has any recommendations for any other medications in place of his metoprolol or if he can come off of it completely. He states he is having Libido issues and he states he talked with Dr.Crenshaw about these issues before as well as his PCP. Patient states he is only doing the Metoprolol once daily and not as prescribed as BID, since his Surgery. Patient also mentions that he is losing weight. Patient's BP this morning is 132/89, he states that is all he is aware of now, but has had no issues with his BP. Denies any other symptoms. I advised patient that I could speak with pharmacy, as Dr.Crenshaw was out on vacation, or he could come see a PA, patient would only like to speak with Dr.Crenshaw, and have him communicate with his PCP regarding these issues, as he has spoken with his PCP and they have discussed moving around his medications and he would like Dr.Crenshaw to be involved.  Patient did make appointment at next opening for Dr.Crenshaw in May, but would like for me to send this message to his nurse to make her aware.  I will route message to Dr.Crenshaw, and to his nurse.

## 2018-08-16 NOTE — Telephone Encounter (Signed)
Follow up  ° ° °Patient is returning your call. °

## 2018-08-16 NOTE — Telephone Encounter (Signed)
Called spoke with patient, patients message was sent to [facility] and nurse.  See previous note.

## 2018-08-17 MED ORDER — DILTIAZEM HCL ER COATED BEADS 120 MG PO CP24: 120.0000 mg | ORAL_CAPSULE | Freq: Every day | ORAL | 3 refills | Status: DC

## 2018-08-17 NOTE — Telephone Encounter (Signed)
Spoke with pt, Aware of dr crenshaw's recommendations. New script sent to the pharmacy  

## 2018-08-26 IMAGING — CR DG CHEST 2V
2 series · 2 of 2 positions shown · non-contrast
Comparison: Two-view chest x-ray 10/11/2017

CLINICAL DATA: Thoracic abdominal aortic aneurysm repair.

EXAM:
CHEST - 2 VIEW

[w chest pa]
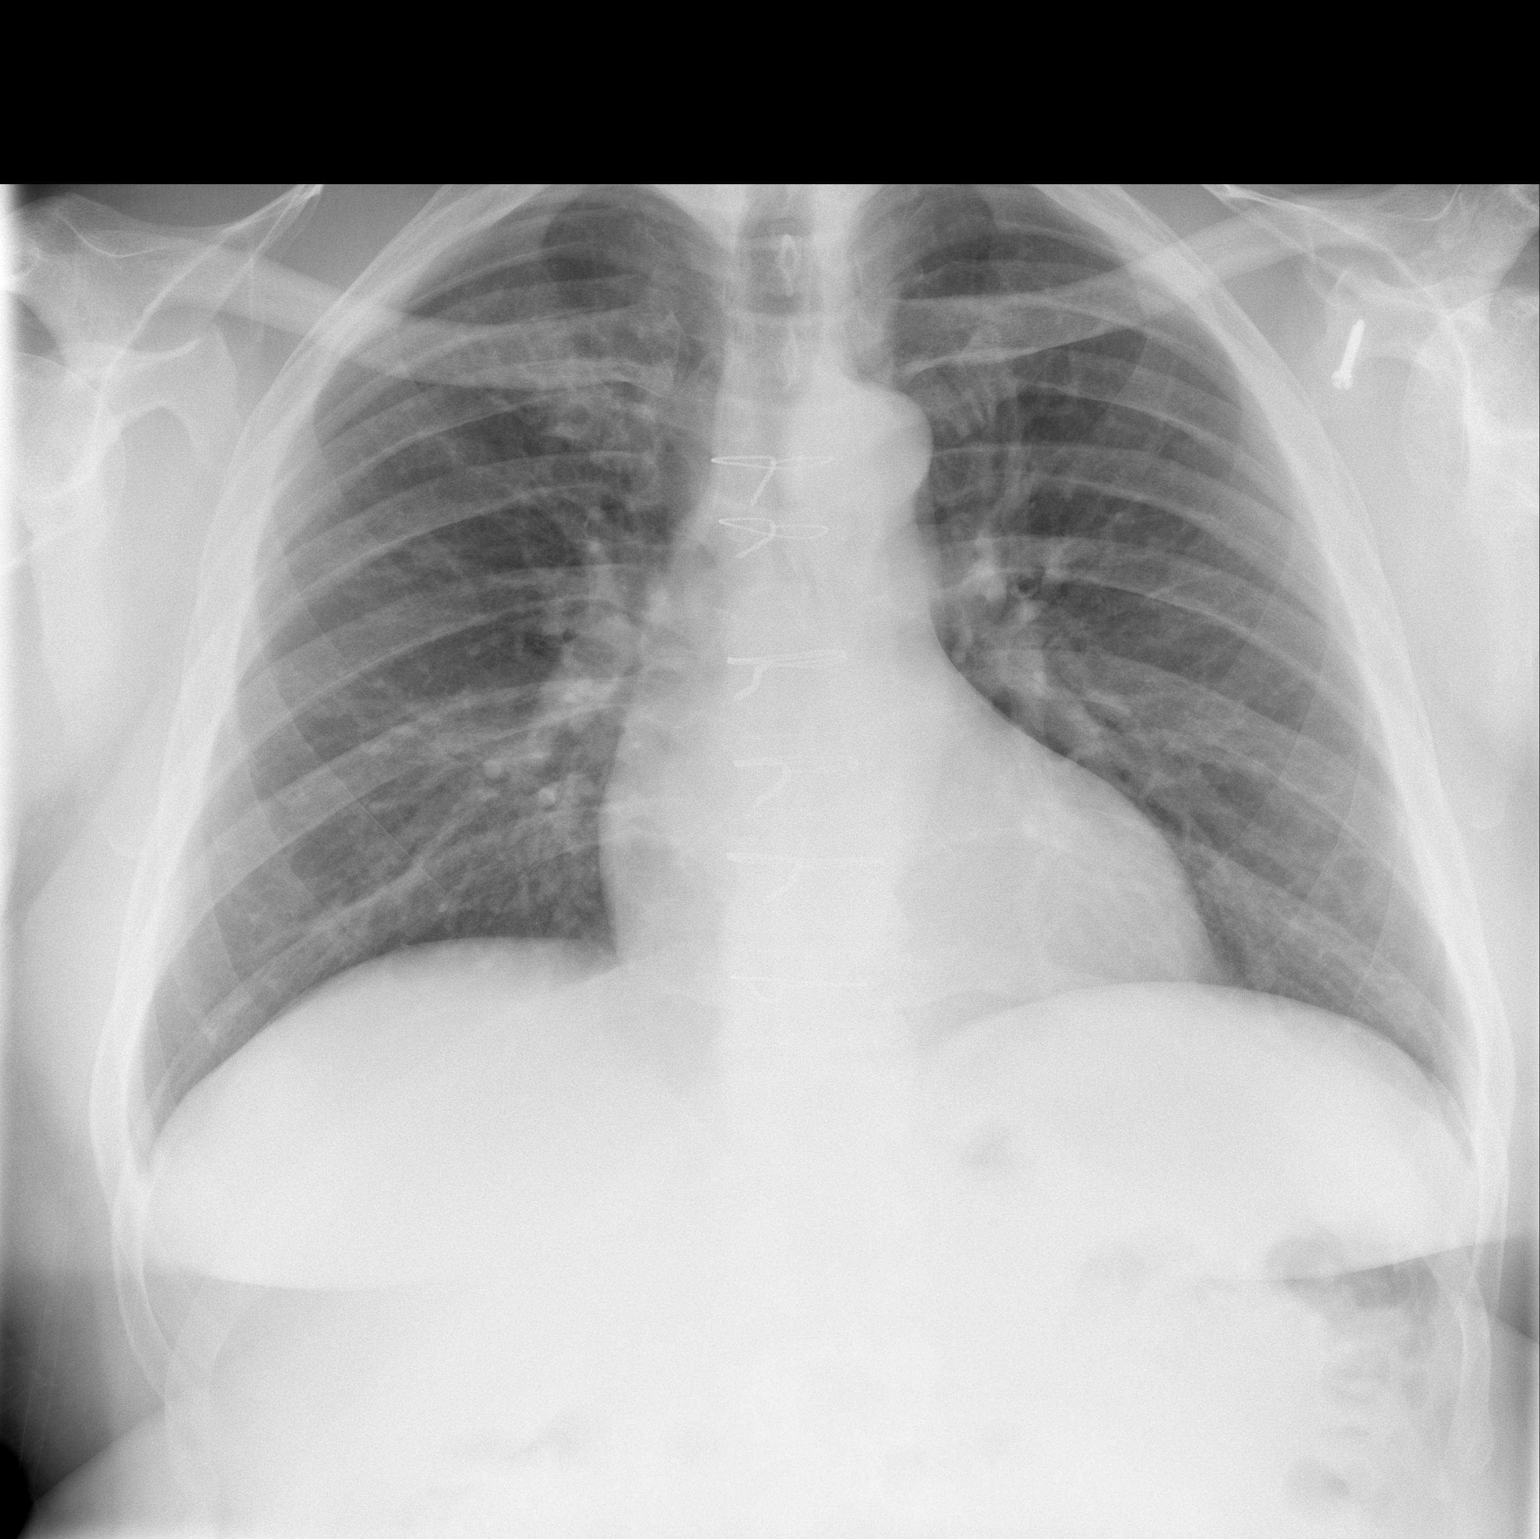

[w chest lat]
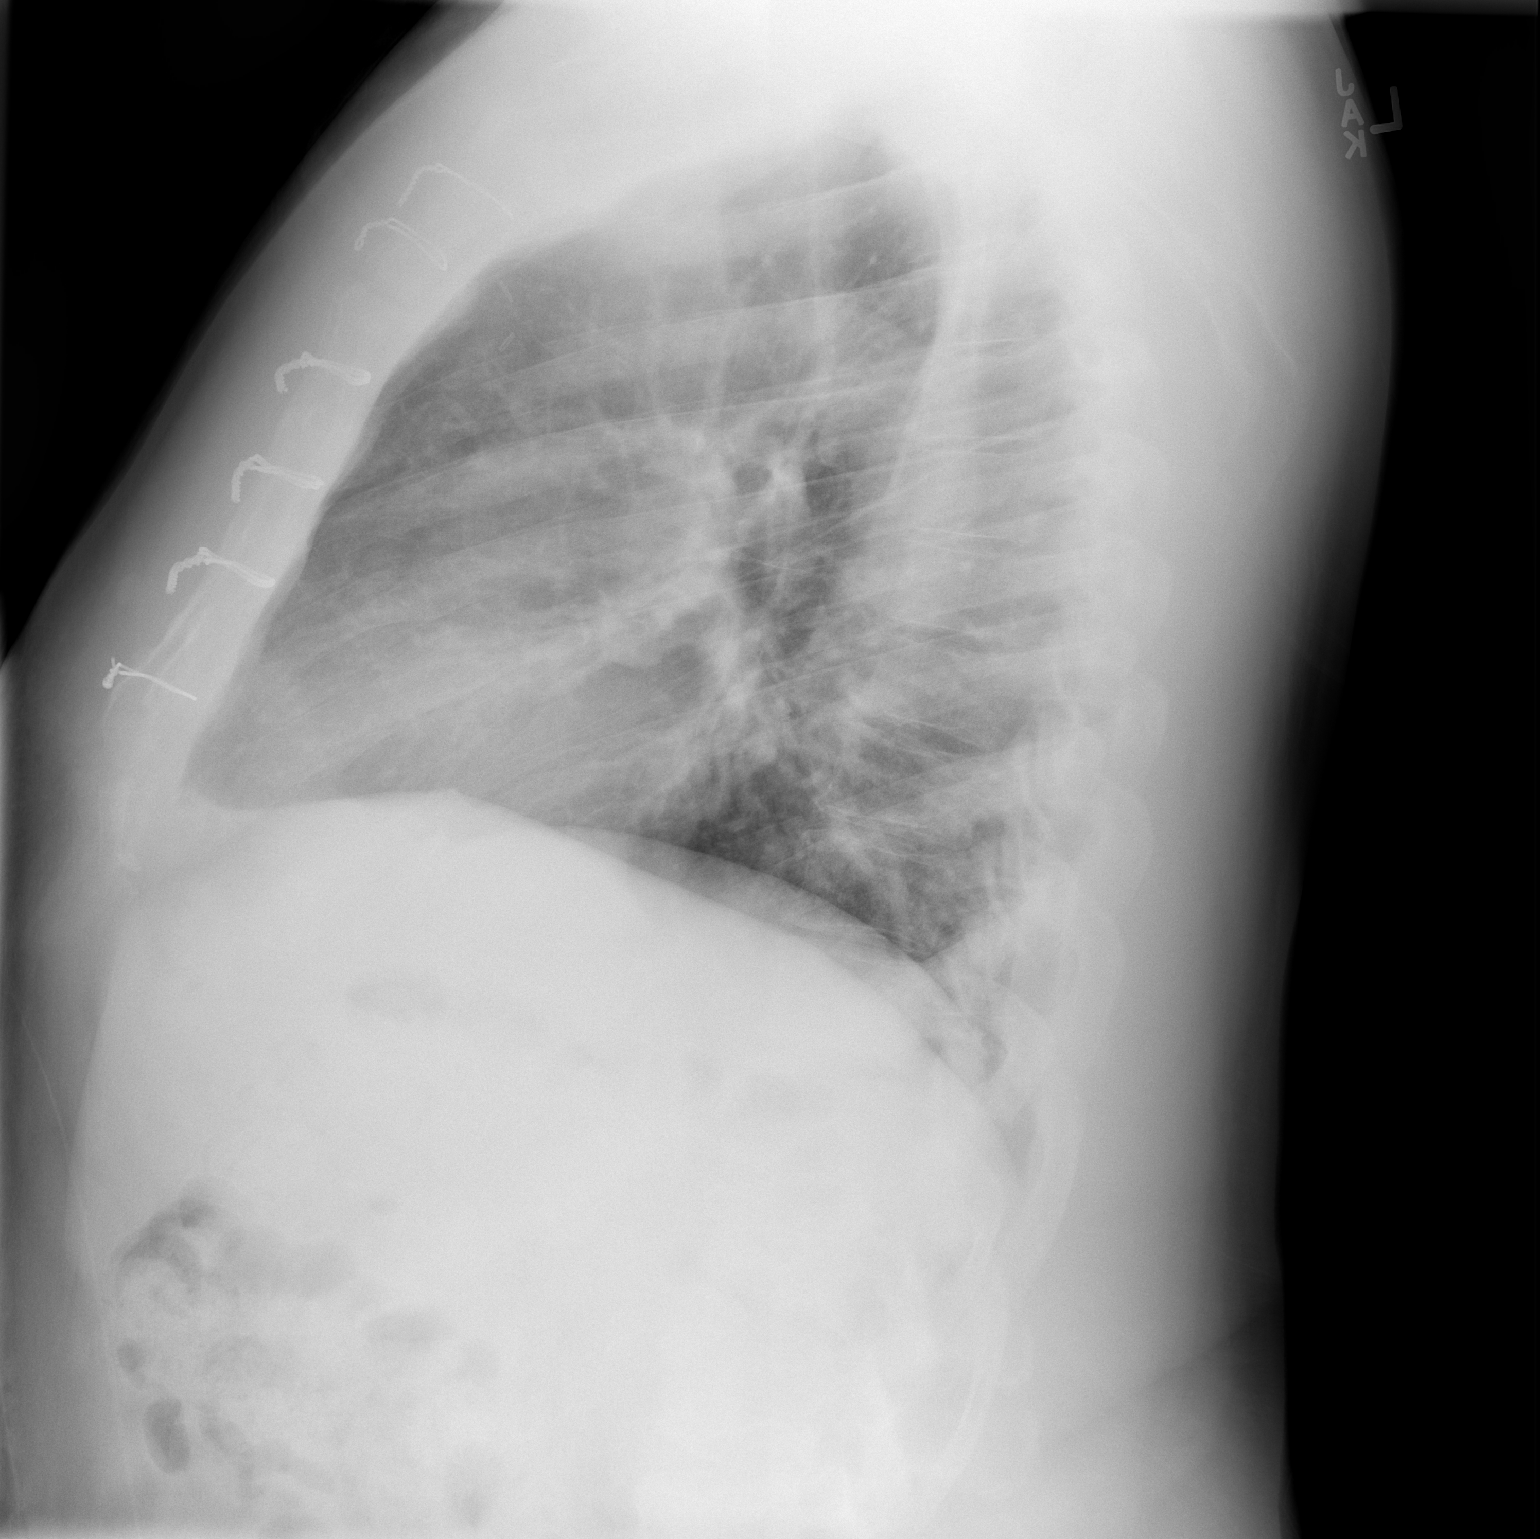

[2 of 2 positions shown; findings below may reference images not displayed]

FINDINGS: The heart size is normal. There is no edema or effusion. No focal
airspace disease is present. Lung volumes are low. Patient is status
post median sternotomy. Postsurgical changes are noted at the left
scapula.
IMPRESSION: No acute cardiopulmonary disease.

Similar appearance of low lung volumes.

## 2018-09-01 ENCOUNTER — Other Ambulatory Visit: Payer: Self-pay | Admitting: Cardiology

## 2018-10-15 ENCOUNTER — Other Ambulatory Visit: Payer: Self-pay

## 2018-10-15 ENCOUNTER — Emergency Department (HOSPITAL_COMMUNITY): Payer: BLUE CROSS/BLUE SHIELD

## 2018-10-15 ENCOUNTER — Emergency Department (HOSPITAL_COMMUNITY)
Admission: EM | Admit: 2018-10-15 | Discharge: 2018-10-15 | Disposition: A | Discharge status: Home / Self Care | Payer: BLUE CROSS/BLUE SHIELD | Attending: Emergency Medicine | Admitting: Emergency Medicine

## 2018-10-15 ENCOUNTER — Encounter (HOSPITAL_COMMUNITY): Payer: Self-pay | Admitting: Emergency Medicine

## 2018-10-15 DIAGNOSIS — R0789 Other chest pain: Secondary | ICD-10-CM | POA: Diagnosis not present

## 2018-10-15 DIAGNOSIS — I10 Essential (primary) hypertension: Secondary | ICD-10-CM | POA: Insufficient documentation

## 2018-10-15 DIAGNOSIS — R079 Chest pain, unspecified: Secondary | ICD-10-CM | POA: Diagnosis present

## 2018-10-15 DIAGNOSIS — Z79899 Other long term (current) drug therapy: Secondary | ICD-10-CM | POA: Diagnosis not present

## 2018-10-15 DIAGNOSIS — Z7982 Long term (current) use of aspirin: Secondary | ICD-10-CM | POA: Insufficient documentation

## 2018-10-15 DIAGNOSIS — Z87891 Personal history of nicotine dependence: Secondary | ICD-10-CM | POA: Diagnosis not present

## 2018-10-15 LAB — URINALYSIS, ROUTINE W REFLEX MICROSCOPIC
Bilirubin Urine: NEGATIVE
Glucose, UA: NEGATIVE mg/dL
Hgb urine dipstick: NEGATIVE
Ketones, ur: NEGATIVE mg/dL
Leukocytes,Ua: NEGATIVE
Nitrite: NEGATIVE
Protein, ur: NEGATIVE mg/dL
Specific Gravity, Urine: 1.029 (ref 1.005–1.030)
pH: 5 (ref 5.0–8.0)

## 2018-10-15 LAB — BASIC METABOLIC PANEL
Anion gap: 11 (ref 5–15)
BUN: 18 mg/dL (ref 6–20)
CO2: 25 mmol/L (ref 22–32)
Calcium: 9.4 mg/dL (ref 8.9–10.3)
Chloride: 101 mmol/L (ref 98–111)
Creatinine, Ser: 1.17 mg/dL (ref 0.61–1.24)
GFR calc Af Amer: >60 mL/min (ref >60)
GFR calc non Af Amer: >60 mL/min (ref >60)
Glucose, Bld: 110 mg/dL — ABNORMAL HIGH (ref 70–99)
Potassium: 4.1 mmol/L (ref 3.5–5.1)
Sodium: 137 mmol/L (ref 135–145)

## 2018-10-15 LAB — TROPONIN I
Troponin I: <0.03 ng/mL (ref <0.03)
Troponin I: <0.03 ng/mL (ref <0.03)

## 2018-10-15 LAB — CBC
HCT: 43.3 % (ref 39.0–52.0)
Hemoglobin: 14.5 g/dL (ref 13.0–17.0)
MCH: 27.4 pg (ref 26.0–34.0)
MCHC: 33.5 g/dL (ref 30.0–36.0)
MCV: 81.7 fL (ref 80.0–100.0)
Platelets: 237 10*3/uL (ref 150–400)
RBC: 5.3 MIL/uL (ref 4.22–5.81)
RDW: 13.7 % (ref 11.5–15.5)
WBC: 8.7 10*3/uL (ref 4.0–10.5)
nRBC: 0 % (ref 0.0–0.2)

## 2018-10-15 MED ORDER — SODIUM CHLORIDE 0.9% FLUSH
3.0000 mL | Freq: Once | INTRAVENOUS | Status: AC
  Administered 2018-10-15: 3 mL via INTRAVENOUS

## 2018-10-15 MED ORDER — ASPIRIN 81 MG PO CHEW
324.0000 mg | CHEWABLE_TABLET | Freq: Once | ORAL | Status: AC
  Administered 2018-10-15: 12:00:00 324 mg via ORAL
  Filled 2018-10-15: qty 4

## 2018-10-15 MED ORDER — IOHEXOL 350 MG/ML SOLN
100.0000 mL | Freq: Once | INTRAVENOUS | Status: AC | PRN
  Administered 2018-10-15: 14:00:00 75 mL via INTRAVENOUS

## 2018-10-15 NOTE — ED Notes (Signed)
Patient verbalizes understanding of discharge instructions. Opportunity for questioning and answers were provided. Armband removed by staff, pt discharged from ED.  

## 2018-10-15 NOTE — ED Notes (Signed)
Xray tech at bedside.

## 2018-10-15 NOTE — ED Triage Notes (Signed)
Pt here for eval of chest pain to right/center chest that radiates up into the throat. Denies shortness of breath but reports pain is worse with deep breathes. Denies pain to shoulder blades. Pt reports low grade temps in 99s. Pt works in health care. Thinks his pain could be related to work outs. Pain is constant. Currently 6/10.

## 2018-10-15 NOTE — ED Provider Notes (Signed)
[facility] EMERGENCY DEPARTMENT Provider Note   CSN: [id number] Arrival date & time: 10/15/18  1104    History   Chief Complaint Chief Complaint  Patient presents with   Chest Pain    HPI Matthew Hoffman is a 53 y.o. male.     HPI   Patient is a 53 year old male with a history of arthritis, diverticulosis, hiatal hernia, hypertension, OSA on CPAP, thoracic aortic aneurysm without rupture s/p repair 08/2017, who presents the emergency department today complaining of midsternal chest pain along his sternotomy scar that began about 3 days ago.  Pain radiates upwards.  No radiation to the back.  Rates pain 6-7/10.  Pain has been constant.  He denies any shortness of breath, dyspnea exertion, or worsening chest pain with exertion.  He does state that he has some pain to his sternal scar with deep inspiration.  No known fevers or cough.  No bilateral lower extremity swelling, calf pain or weight gain.  Reports fatigue a few days ago.  States he has been working out for several weeks and doing a lot of heavy lifting over the last few weeks.  He is concerned that there is an issue with his prior sternotomy.  He has not taken any medications prior to arrival. Denies h/o tobacco use. No fam hx of early heart dz or sudden cardiac death.   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   Thoracic aortic aneurysm without rupture (HCC)    Abdominal wall hernia 08/15/2017   Hypertension 08/15/2017   Sleep apnea 08/15/2017    Past Surgical History:  Procedure Laterality Date   CARDIOVERSION N/A 09/15/2017   Procedure:  CARDIOVERSION;  Surgeon: Wendall Stade, MD;  Location: [facility] ENDOSCOPY;  Service: Cardiovascular;  Laterality: N/A;   COLON RESECTION     ELBOW SURGERY Right 1999   KNEE ARTHROSCOPY Left 1995   RIGHT/LEFT HEART CATH AND CORONARY ANGIOGRAPHY N/A 08/24/2017   Procedure: RIGHT/LEFT HEART CATH AND CORONARY ANGIOGRAPHY;  Surgeon: Kathleene Hazel, MD;  Location: [facility] INVASIVE CV LAB;  Service: Cardiovascular;  Laterality: N/A;   SHOULDER SURGERY Bilateral right 1992/  left 1987   TEE WITHOUT CARDIOVERSION N/A 09/08/2017   Procedure: TRANSESOPHAGEAL ECHOCARDIOGRAM (TEE);  Surgeon: Loreli Slot, MD;  Location: [facility] OR;  Service: Open Heart Surgery;  Laterality: N/A;   TEE WITHOUT CARDIOVERSION N/A 09/15/2017   Procedure: TRANSESOPHAGEAL ECHOCARDIOGRAM (TEE);  Surgeon: Wendall Stade, MD;  Location: [facility] END[facility]COPY;  Service: Cardiovascular;  Laterality: N/A;   THORACIC AORTIC ANEURYSM REPAIR N/A 09/08/2017   Procedure: REPAIR OF SINUS OF VALSALVA ANEURYSM;  Surgeon: Loreli Slot, MD;  Location: [facility] OR;  Service: Open Heart Surgery;  Laterality: N/A;  Using 30mm Valsalva Gelweave Graft   UMBILICAL HERNIA REPAIR  10/2015   VASECTOMY Bilateral 06/10/2016   Procedure: VASECTOMY;  Surgeon: Malen Gauze, MD;  Location[facility];  Service: Urology;  Laterality: Bilateral;       Home Medications    Prior  to Admission medications   Medication Sig Start Date End Date Taking? Authorizing Provider  acetaminophen (TYLENOL) 325 MG tablet Take 2 tablets (650 mg total) by mouth every 6 (six) hours as needed for mild pain or moderate pain. 09/16/17  Yes Sharlene Doryonte, Tessa N, PA-C  aspirin EC 81 MG tablet Take 81 mg by mouth daily.   Yes [provider]  diltiazem (CARDIZEM CD) 120 MG 24 hr capsule Take 1 capsule (120 mg total) by mouth daily. 08/17/18 11/15/18 Yes Lewayne Buntingrenshaw, Brian S, MD  EPINEPHrine (EPIPEN 2-PAK) 0.3 mg/0.3 mL IJ SOAJ injection Inject 0.3 mg into the  muscle as needed (for allergic reaction).    Yes [provider]  fluticasone (FLONASE) 50 MCG/ACT nasal spray Place 1 spray into both nostrils as needed. 09/04/18  Yes [provider]  Multiple Vitamin (MULTIVITAMIN) tablet Take 1 tablet by mouth daily. 50 plus   Yes [provider]  valsartan-hydrochlorothiazide (DIOVAN-HCT) 320-12.5 MG tablet Take 1 tablet by mouth daily. 12/15/17  Yes Lewayne Buntingrenshaw, Brian S, MD  metoprolol tartrate (LOPRESSOR) 50 MG tablet TAKE 1 TABLET BY MOUTH TWICE DAILY Patient not taking: Reported on 10/15/2018 09/01/18   Lewayne Buntingrenshaw, Brian S, MD  sildenafil (VIAGRA) 100 MG tablet Take 100 mg by mouth as needed. 10/04/18   [provider]    Family History Family History  Problem Relation Age of Onset   Hypertension Mother    COPD Father     Social History Social History   Tobacco Use   Smoking status: Former Smoker    Years: 0.00    Types: Cigars   Smokeless tobacco: Never Used   Tobacco comment: average cigar 2 per month  Substance Use Topics   Alcohol use: Yes    Comment: OCCASIONAL   Drug use: No     Allergies   Shellfish allergy; Oxycodone; and Other   Review of Systems Review of Systems  Constitutional: Negative for chills and fever.  HENT: Negative for ear pain and sore throat.   Eyes: Negative for visual disturbance.  Respiratory: Negative for cough and shortness of breath.   Cardiovascular: Positive for chest pain. Negative for palpitations and leg swelling.  Gastrointestinal: Negative for abdominal pain, diarrhea, nausea and vomiting.  Genitourinary: Negative for flank pain.  Musculoskeletal: Negative for back pain.  Skin: Negative for rash.  Neurological: Negative for dizziness, weakness, light-headedness, numbness and headaches.  All other systems reviewed and are negative.    Physical Exam Updated Vital Signs BP 128/84 (BP Location: Right Arm)    Pulse 75    Temp 99.7 F (37.6 C) (Oral)    Resp 13     SpO2 98%   Physical Exam Vitals signs and nursing note reviewed.  Constitutional:      Appearance: He is well-developed.  HENT:     Head: Normocephalic and atraumatic.  Eyes:     Conjunctiva/sclera: Conjunctivae normal.  Neck:     Musculoskeletal: Neck supple.     Trachea: No tracheal deviation.  Cardiovascular:     Rate and Rhythm: Normal rate and regular rhythm.     Pulses:          Radial pulses are 2+ on the right side and 2+ on the left side.       Dorsalis pedis pulses are 2+ on the right side and 2+ on the left side.     Heart sounds: Normal heart sounds. No murmur.  Pulmonary:     Effort: Pulmonary effort is normal. No  respiratory distress.     Breath sounds: No decreased breath sounds, wheezing, rhonchi or rales.  Chest:     Comments: Midline sternotomy scar that is well healed. TTP to the lower aspect of the sternotomy scar that reproduces pain. No erythema, induration, or fluctuance.  Abdominal:     Palpations: Abdomen is soft.     Tenderness: There is no abdominal tenderness.  Musculoskeletal:     Right lower leg: He exhibits no tenderness. No edema.     Left lower leg: He exhibits no tenderness. No edema.  Skin:    General: Skin is warm and dry.  Neurological:     Mental Status: He is alert.     ED Treatments / Results  Labs (all labs ordered are listed, but only abnormal results are displayed) Labs Reviewed  BASIC METABOLIC PANEL - Abnormal; Notable for the following components:      Result Value   Glucose, Bld 110 (*)    All other components within normal limits  CBC  TROPONIN I  TROPONIN I  URINALYSIS, ROUTINE W REFLEX MICROSCOPIC    EKG EKG Interpretation  Date/Time:  Sunday October 15 2018 11:19:01 EDT Ventricular Rate:  88 PR Interval:    QRS Duration: 121 QT Interval:  372 QTC Calculation: 451 R Axis:   82 Text Interpretation:  Sinus rhythm Nonspecific intraventricular conduction delay Abnormal inferior Q waves No significant change  since last tracing Confirmed by Linwood Dibbles [phone]) on 10/15/2018 11:27:53 AM   Radiology Dg Chest Portable 1 View  Result Date: 10/15/2018 CLINICAL DATA:  Chest pain EXAM: PORTABLE CHEST 1 VIEW COMPARISON:  None. FINDINGS: The heart size and mediastinal contours are within normal limits. Prior median sternotomy. Both lungs are clear. The visualized skeletal structures are unremarkable. IMPRESSION: No active disease. Electronically Signed   By: Elige Ko   On: 10/15/2018 12:04   Ct Angio Chest Aorta W/cm &/or Wo/cm  Result Date: 10/15/2018 CLINICAL DATA:  Pain. History of thoracic aortic aneurysm. Pain is sternotomy site. Pleuritic pain. EXAM: CT ANGIOGRAPHY CHEST WITH CONTRAST TECHNIQUE: Multidetector CT imaging of the chest was performed using the standard protocol during bolus administration of intravenous contrast. Multiplanar CT image reconstructions and MIPs were obtained to evaluate the vascular anatomy. CONTRAST:  21mL OMNIPAQUE IOHEXOL 350 MG/ML SOLN COMPARISON:  August 12, 2017 CT scan.  October 14, 2018 chest x-ray. FINDINGS: Cardiovascular: The heart size is unchanged. Coronary artery calcifications involve the right and left coronary arteries. The central pulmonary arteries are normal. The previous proximal ascending thoracic aortic aneurysm has been repaired in the interval with no evidence of leak, dissection, or aneurysm on today's study. No significant atherosclerotic change. The remainder of the thoracic aorta is normal with no aneurysm or dissection. Mediastinum/Nodes: There is a small hiatal hernia. No effusion or adenopathy. Visualized thyroid is normal. Lungs/Pleura: Central airways are normal. No pneumothorax. No pulmonary nodules, masses, or focal infiltrates. Upper Abdomen: A partially calcified mass in the spleen is stable, of no significance. Upper abdomen is otherwise unremarkable. Musculoskeletal: No evidence of osteomyelitis or infection along the sternotomy. The sternotomy  wires are intact. The surrounding soft tissues are normal. Mild increased lucency along the sternotomy site at the level the manubrium likely represents some fibrous union with no bony destructive changes identified. Review of the MIP images confirms the above findings. IMPRESSION: 1. No cause for the patient's symptoms identified. No acute abnormalities. 2. Coronary artery calcifications. 3. Surgical changes of previous aneurysm repair. 4. Small hiatal  hernia. Electronically Signed   By: Gerome Sam III M.D   On: 10/15/2018 14:18    Procedures Procedures (including critical care time)  Medications Ordered in ED Medications  sodium chloride flush (NS) 0.9 % injection 3 mL (3 mLs Intravenous Given 10/15/18 1136)  aspirin chewable tablet 324 mg (324 mg Oral Given 10/15/18 1138)  iohexol (OMNIPAQUE) 350 MG/ML injection 100 mL (75 mLs Intravenous Contrast Given 10/15/18 1332)     Initial Impression / Assessment and Plan / ED Course  I have reviewed the triage vital signs and the nursing notes.  Pertinent labs & imaging results that were available during my care of the patient were reviewed by me and considered in my medical decision making (see chart for details).     Final Clinical Impressions(s) / ED Diagnoses   Final diagnoses:  Chest wall pain   53 y/o male with h/o thoracic aortic aneurysm s/p repair (08/2017) presents with c/o chest pain along his sternotomy scar that began 3 days ago. Pain nonexertional. No SOB, lightheadedness, or dizziness. Pain reproducible on exam. No signs of infection to sternotomy site.   Will obtain labs, EKG, CXR, give ASA and reassess.  CBC with mild anemia, improved from prior. BMP WNL Trop WNL Delta trop WNL  EKG with NSR, with nonspecific IVCD, Abnormal inferior Q waves, unchanged from prior   CXR is WNL  CT dissection protocol with no cause for the patient's symptoms identified. No acute abnormalities. Coronary artery calcifications.  Surgical  changes of previous aneurysm repair. Small hiatal hernia.   HEART score is 3. His sxs do not sound typical of ACS given I can reproduce his pain on exam. I do not think that he has a PE. His CTA is negative for dissection or recurrent aneurysm.   I suspect he may have some muscle soreness from his recent reports of increased exercise and heavy lifting. I believe he is stable for discharge. I discussed results with the patient and advised him to contact his surgeon in regards to his sxs today. He is comfortable with the plan for discharge and f/u. I discussed strict return precautions. He voices understanding of the plan and reasons to return. All questions answered.   Case discussed with Dr. Lynelle Doctor who is in agreement with plan.  ED Discharge Orders    None       Rayne Du 10/15/18 1542    Linwood Dibbles, MD 10/17/18 [phone]

## 2018-10-15 NOTE — Discharge Instructions (Addendum)
Please follow up with your primary care doctor or the doctor who completed your aneurysm repair within the next week.  You may take tylenol and use warm or cool compresses for your symptoms.  Please return to the emergency department for any new or worsening symptoms.

## 2018-11-08 NOTE — Progress Notes (Signed)
Virtual Visit via Video Note   This visit type was conducted due to national recommendations for restrictions regarding the COVID-19 Pandemic (e.g. social distancing) in an effort to limit this patient's exposure and mitigate transmission in our community.  Due to his co-morbid illnesses, this patient is at least at moderate risk for complications without adequate follow up.  This format is felt to be most appropriate for this patient at this time.  All issues noted in this document were discussed and addressed.  A limited physical exam was performed with this format.  Please refer to the patient's chart for his consent to telehealth for [facility].   Date:  11/10/2018   ID:  Matthew Hoffman, DOB [birth year], MRN [id number]  Patient Location: Home Provider Location: Home  PCP:  Ralene Ok, MD  Cardiologist:  Olga Millers, MD   Evaluation Performed:  Follow-Up Visit  Chief Complaint:  Thoracic aortic aneurysm and CP  History of Present Illness:    Follow-up thoracic aortic aneurysm. 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 Preoperative echocardiogram showed normal LV function, moderate diastolic dysfunction, aortic aneurysm with mild aortic insufficiency and moderate left atrial enlargement. Cardiac catheterization preoperatively showed no coronary disease. 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 Preoperative carotid Dopplers showed no significant stenosis. Patient had repair of sinus of Valsalva aneurysm using a graft with resuspension of aortic valve 3/19. Postoperative course complicated by atrial flutter and patient had TEE guided cardioversion. Patient had dehiscence of his upper portion of the sternal wound and was treated with Keflex.  Patient seen with chest pain April 2020.  CTA showed no acute abnormalities with surgical changes of previous aneurysm repair.  Troponins were normal.  Hemoglobin 14.5.   Since last seen patient denies dyspnea, palpitations or syncope.  He did have chest soreness for 4 continuous days after doing push-ups but no other chest pain.  The patient does not have symptoms concerning for COVID-19 infection (fever, chills, cough, or new shortness of breath).    Past Medical History:  Diagnosis Date   Arthritis    Diverticulosis    Hiatal hernia    small per ct 2016   Hypertension    Hypertrophy of prostate    mild   OSA on CPAP    per pt moderate osa per study   Sinus of Valsalva aneurysm    Wears glasses    Past Surgical History:  Procedure Laterality Date   CARDIOVERSION N/A 09/15/2017   Procedure: CARDIOVERSION;  Surgeon: Wendall Stade, MD;  Location: [facility] ENDOSCOPY;  Service: Cardiovascular;  Laterality: N/A;   COLON RESECTION     ELBOW SURGERY Right 1999   KNEE ARTHROSCOPY Left 1995   RIGHT/LEFT HEART CATH AND CORONARY ANGIOGRAPHY N/A 08/24/2017   Procedure: RIGHT/LEFT HEART CATH AND CORONARY ANGIOGRAPHY;  Surgeon: Kathleene Hazel, MD;  Location: [facility] INVASIVE CV LAB;  Service: Cardiovascular;  Laterality: N/A;   SHOULDER SURGERY Bilateral right 1992/  left 1987   TEE WITHOUT CARDIOVERSION N/A 09/08/2017   Procedure: TRANSESOPHAGEAL ECHOCARDIOGRAM (TEE);  Surgeon: Loreli Slot, MD;  Location: [facility] OR;  Service: Open Heart Surgery;  Laterality: N/A;   TEE WITHOUT CARDIOVERSION N/A 09/15/2017   Procedure: TRANSESOPHAGEAL ECHOCARDIOGRAM (TEE);  Surgeon: Wendall Stade, MD;  Location: [facility] ENDOSCOPY;  Service: Cardiovascular;  Laterality: N/A;   THORACIC AORTIC ANEURYSM REPAIR N/A 09/08/2017   Procedure: REPAIR OF SINUS OF VALSALVA ANEURYSM;  Surgeon: Dorris Fetch,  Salvatore Decent, MD;  Location: [facility] OR;  Service: Open Heart Surgery;  Laterality: N/A;  Using 30mm Valsalva Gelweave Graft   UMBILICAL HERNIA REPAIR  10/2015   VASECTOMY Bilateral 06/10/2016   Procedure: VASECTOMY;  Surgeon: Malen Gauze, MD;  Location: [facility];  Service: Urology;  Laterality: Bilateral;     Current Meds  Medication Sig   acetaminophen (TYLENOL) 325 MG tablet Take 2 tablets (650 mg total) by mouth every 6 (six) hours as needed for mild pain or moderate pain.   aspirin EC 81 MG tablet Take 81 mg by mouth daily.   diltiazem (CARDIZEM CD) 120 MG 24 hr capsule Take 1 capsule (120 mg total) by mouth daily.   EPINEPHrine (EPIPEN 2-PAK) 0.3 mg/0.3 mL IJ SOAJ injection Inject 0.3 mg into the muscle as needed (for allergic reaction).    fluticasone (FLONASE) 50 MCG/ACT nasal spray Place 1 spray into both nostrils as needed.   Multiple Vitamin (MULTIVITAMIN) tablet Take 1 tablet by mouth daily. 50 plus   sildenafil (VIAGRA) 100 MG tablet Take 100 mg by mouth as needed.   valsartan-hydrochlorothiazide (DIOVAN-HCT) 320-12.5 MG tablet Take 1 tablet by mouth daily.     Allergies:   Shellfish allergy; Oxycodone; and Other   Social History   Tobacco Use   Smoking status: Former Smoker    Years: 0.00    Types: Cigars   Smokeless tobacco: Never Used   Tobacco comment: average cigar 2 per month  Substance Use Topics   Alcohol use: Yes    Comment: OCCASIONAL   Drug use: No     Family Hx: The patient's family history includes COPD in his father; Hypertension in his mother.  ROS:   Please see the history of present illness.    No fevers, chills or productive cough. All other systems reviewed and are negative.  Labs/Other Tests and Data Reviewed:    EKG:  An ECG dated 10/15/18 was personally reviewed today and demonstrated:  Normal sinus rhythm with nonspecific ST changes.  Recent Labs: 10/15/2018: BUN 18; Creatinine, Ser 1.17; Hemoglobin 14.5; Platelets 237; Potassium 4.1; Sodium 137    Wt Readings from Last 3 Encounters:  11/10/18 288 lb (130.6 kg)  03/27/18 297 lb (134.7 kg)  01/23/18 292 lb 12.3 oz (132.8 kg)     Objective:    Vital Signs:  BP (!) 145/81    Ht 6' 2.5" (1.892 m)    Wt 288 lb  (130.6 kg)    BMI 36.48 kg/m    VITAL SIGNS:  reviewed  No acute distress Answers questions appropriately Normal affect Remainder of physical examination not performed (telehealth visit; coronavirus pandemic)  ASSESSMENT & PLAN:    1. Chest pain-felt to be musculoskeletal as was continuous for 4 days and began after push-ups.  CTA showed no issues with prior aneurysm repair.  Troponins normal.  Preoperative cardiac catheterization previously showed no obstructive coronary disease. 2. Thoracic aortic aneurysm status post repair-patient had CTA during recent evaluation and there were no issues with prior repair. 3. Hypertension-blood pressure is elevated; increase Cardizem CD to 180 mg daily and follow. 4. Postoperative atrial flutter-no evidence of recurrence.  COVID-19 Education: The importance of social distancing was discussed today.  Time:   Today, I have spent 12 minutes with the patient with telehealth technology discussing the above problems.     Medication Adjustments/Labs and Tests Ordered: Current medicines are reviewed at length with the patient today.  Concerns regarding medicines are outlined  above.   Tests Ordered: No orders of the defined types were placed in this encounter.   Medication Changes: No orders of the defined types were placed in this encounter.   Disposition:  Follow up in 6 month(s)  Signed, Olga MillersBrian Tiquan Bouch, MD  11/10/2018 9:21 AM    [facility]

## 2018-11-09 ENCOUNTER — Telehealth: Payer: Self-pay | Admitting: Cardiology

## 2018-11-10 ENCOUNTER — Telehealth (INDEPENDENT_AMBULATORY_CARE_PROVIDER_SITE_OTHER): Payer: BLUE CROSS/BLUE SHIELD | Admitting: Cardiology

## 2018-11-10 ENCOUNTER — Encounter: Payer: Self-pay | Admitting: Cardiology

## 2018-11-10 VITALS — BP 145/81 | Ht 74.5 in | Wt 288.0 lb

## 2018-11-10 DIAGNOSIS — I712 Thoracic aortic aneurysm, without rupture, unspecified: Secondary | ICD-10-CM

## 2018-11-10 DIAGNOSIS — R072 Precordial pain: Secondary | ICD-10-CM | POA: Diagnosis not present

## 2018-11-10 DIAGNOSIS — I10 Essential (primary) hypertension: Secondary | ICD-10-CM

## 2018-11-10 MED ORDER — DILTIAZEM HCL ER COATED BEADS 180 MG PO CP24: 180.0000 mg | ORAL_CAPSULE | Freq: Every day | ORAL | 3 refills | Status: DC

## 2018-11-10 NOTE — Patient Instructions (Signed)
Medication Instructions:  INCREASE DILTIAZEM TO 180 MG ONCE DAILY If you need a refill on your cardiac medications before your next appointment, please call your pharmacy.   Lab work: If you have labs (blood work) drawn today and your tests are completely normal, you will receive your results only by: Marland Kitchen MyChart Message (if you have MyChart) OR . A paper copy in the mail If you have any lab test that is abnormal or we need to change your treatment, we will call you to review the results.  Follow-Up: At [facility], you and your health needs are our priority.  As part of our continuing mission to provide you with exceptional heart care, we have created designated Provider Care Teams.  These Care Teams include your primary Cardiologist (physician) and Advanced Practice Providers (APPs -  Physician Assistants and Nurse Practitioners) who all work together to provide you with the care you need, when you need it. You will need a follow up appointment in 6 months.  Please call our office 2 months in advance to schedule this appointment.  You may see Olga Millers, MD or one of the following Advanced Practice Providers on your designated Care Team:   Corine Shelter, PA-C Judy Pimple, [place] . Marjie Skiff, PA-C

## 2018-12-08 ENCOUNTER — Other Ambulatory Visit: Payer: Self-pay | Admitting: Cardiology

## 2018-12-08 DIAGNOSIS — I10 Essential (primary) hypertension: Secondary | ICD-10-CM

## 2018-12-26 NOTE — Telephone Encounter (Signed)
Open n error °

## 2019-07-12 ENCOUNTER — Other Ambulatory Visit: Payer: Self-pay

## 2019-07-12 ENCOUNTER — Encounter: Payer: Self-pay | Admitting: Cardiology

## 2019-07-12 ENCOUNTER — Ambulatory Visit (INDEPENDENT_AMBULATORY_CARE_PROVIDER_SITE_OTHER): Payer: BC Managed Care – PPO | Admitting: Cardiology

## 2019-07-12 VITALS — BP 122/86 | HR 62 | Temp 97.2°F | Ht 73.0 in | Wt 285.0 lb

## 2019-07-12 DIAGNOSIS — R072 Precordial pain: Secondary | ICD-10-CM

## 2019-07-12 DIAGNOSIS — I712 Thoracic aortic aneurysm, without rupture, unspecified: Secondary | ICD-10-CM

## 2019-07-12 DIAGNOSIS — I10 Essential (primary) hypertension: Secondary | ICD-10-CM | POA: Diagnosis not present

## 2019-07-12 NOTE — Patient Instructions (Signed)
Medication Instructions:  NO CHANGE *If you need a refill on your cardiac medications before your next appointment, please call your pharmacy*  Lab Work: If you have labs (blood work) drawn today and your tests are completely normal, you will receive your results only by: . MyChart Message (if you have MyChart) OR . A paper copy in the mail If you have any lab test that is abnormal or we need to change your treatment, we will call you to review the results.  Follow-Up: At [facility], you and your health needs are our priority.  As part of our continuing mission to provide you with exceptional heart care, we have created designated Provider Care Teams.  These Care Teams include your primary Cardiologist (physician) and Advanced Practice Providers (APPs -  Physician Assistants and Nurse Practitioners) who all work together to provide you with the care you need, when you need it.  Your next appointment:   12 month(s)  The format for your next appointment:   Either In Person or Virtual  Provider:   You may see Brian Crenshaw, MD or one of the following Advanced Practice Providers on your designated Care Team:    Luke Kilroy, PA-C  Callie Goodrich, PA-C  Jesse Cleaver, FNP    

## 2019-07-12 NOTE — Progress Notes (Signed)
HPI: Follow-up thoracic aortic aneurysm. 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 Preoperative echocardiogram showed normal LV function, moderate diastolic dysfunction, aortic aneurysm with mild aortic insufficiency and moderate left atrial enlargement. Cardiac catheterization preoperatively showed no coronary disease. 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 Preoperative carotid Dopplers showed no significant stenosis. Patient had repair of sinus of Valsalva aneurysm using a graft with resuspension of aortic valve 3/19. Postoperative course complicated by atrial flutter and patient had TEE guided cardioversion. Patient had dehiscence of his upper portion of the sternal wound and was treated with Keflex.  Patient seen with chest pain April 2020.  CTA showed no acute abnormalities with surgical changes of previous aneurysm repair. Since last seen  there is no dyspnea on exertion, orthopnea, PND, pedal edema or syncope.  Occasional soreness in his chest but no other chest pain.  Current Outpatient Medications  Medication Sig Dispense Refill  . acetaminophen (TYLENOL) 325 MG tablet Take 2 tablets (650 mg total) by mouth every 6 (six) hours as needed for mild pain or moderate pain.    Marland Kitchen aspirin EC 81 MG tablet Take 81 mg by mouth daily.    Marland Kitchen diltiazem (CARDIZEM CD) 180 MG 24 hr capsule Take 1 capsule (180 mg total) by mouth daily. 90 capsule 3  . EPINEPHrine (EPIPEN 2-PAK) 0.3 mg/0.3 mL IJ SOAJ injection Inject 0.3 mg into the muscle as needed (for allergic reaction).     . fluticasone (FLONASE) 50 MCG/ACT nasal spray Place 1 spray into both nostrils as needed.    . metoprolol tartrate (LOPRESSOR) 50 MG tablet TAKE 1 TABLET BY MOUTH TWICE DAILY (Patient not taking: Reported on 11/10/2018) 60 tablet 1  . Multiple Vitamin (MULTIVITAMIN) tablet Take 1 tablet by mouth daily. 50 plus    . sildenafil (VIAGRA) 100 MG tablet Take  100 mg by mouth as needed.    . valsartan-hydrochlorothiazide (DIOVAN-HCT) 320-12.5 MG tablet TAKE 1 TABLET BY MOUTH DAILY 90 tablet 2   No current facility-administered medications for this visit.     Past Medical History:  Diagnosis Date  . Arthritis   . Diverticulosis   . Hiatal hernia    small per ct 2016  . Hypertension   . Hypertrophy of prostate    mild  . OSA on CPAP    per pt moderate osa per study  . Sinus of Valsalva aneurysm   . Wears glasses     Past Surgical History:  Procedure Laterality Date  . CARDIOVERSION N/A 09/15/2017   Procedure: CARDIOVERSION;  Surgeon: Josue Hector, MD;  Location: [facility] ENDOSCOPY;  Service: Cardiovascular;  Laterality: N/A;  . COLON RESECTION    . ELBOW SURGERY Right 1999  . KNEE ARTHROSCOPY Left 1995  . RIGHT/LEFT HEART CATH AND CORONARY ANGIOGRAPHY N/A 08/24/2017   Procedure: RIGHT/LEFT HEART CATH AND CORONARY ANGIOGRAPHY;  Surgeon: Burnell Blanks, MD;  Location: [place] CV LAB;  Service: Cardiovascular;  Laterality: N/A;  . SHOULDER SURGERY Bilateral right 1992/  left 1987  . TEE WITHOUT CARDIOVERSION N/A 09/08/2017   Procedure: TRANSESOPHAGEAL ECHOCARDIOGRAM (TEE);  Surgeon: Melrose Nakayama, MD;  Location: [place];  Service: Open Heart Surgery;  Laterality: N/A;  . TEE WITHOUT CARDIOVERSION N/A 09/15/2017   Procedure: TRANSESOPHAGEAL ECHOCARDIOGRAM (TEE);  Surgeon: Josue Hector, MD;  Location: [place];  Service: Cardiovascular;  Laterality: N/A;  . THORACIC AORTIC ANEURYSM REPAIR N/A 09/08/2017   Procedure:  REPAIR OF SINUS OF VALSALVA ANEURYSM;  Surgeon: Loreli Slot, MD;  [facility] OR;  Service: Open Heart Surgery;  Laterality: N/A;  Using 54mm Valsalva Gelweave Graft  . UMBILICAL HERNIA REPAIR  10/2015  . VASECTOMY Bilateral 06/10/2016   Procedure: VASECTOMY;  Surgeon: Malen Gauze, MD;  Location: [facility];  Service: Urology;  Laterality: Bilateral;    Social History    Socioeconomic History  . Marital status: Divorced    Spouse name: Not on file  . Number of children: 2  . Years of education: Not on file  . Highest education level: Not on file  Occupational History    Comment: Counseling  Tobacco Use  . Smoking status: Former Smoker    Years: 0.00    Types: Cigars  . Smokeless tobacco: Never Used  . Tobacco comment: average cigar 2 per month  Substance and Sexual Activity  . Alcohol use: Yes    Comment: OCCASIONAL  . Drug use: No  . Sexual activity: Not on file  Other Topics Concern  . Not on file  Social History Narrative  . Not on file   Social Determinants of Health   Financial Resource Strain:   . Difficulty of Paying Living Expenses: Not on file  Food Insecurity:   . Worried About Programme researcher, broadcasting/film/video in the Last Year: Not on file  . Ran Out of Food in the Last Year: Not on file  Transportation Needs:   . Lack of Transportation (Medical): Not on file  . Lack of Transportation (Non-Medical): Not on file  Physical Activity:   . Days of Exercise per Week: Not on file  . Minutes of Exercise per Session: Not on file  Stress:   . Feeling of Stress : Not on file  Social Connections:   . Frequency of Communication with Friends and Family: Not on file  . Frequency of Social Gatherings with Friends and Family: Not on file  . Attends Religious Services: Not on file  . Active Member of Clubs or Organizations: Not on file  . Attends Banker Meetings: Not on file  . Marital Status: Not on file  Intimate Partner Violence:   . Fear of Current or Ex-Partner: Not on file  . Emotionally Abused: Not on file  . Physically Abused: Not on file  . Sexually Abused: Not on file    Family History  Problem Relation Age of Onset  . Hypertension Mother   . COPD Father     ROS: no fevers or chills, productive cough, hemoptysis, dysphasia, odynophagia, melena, hematochezia, dysuria, hematuria, rash, seizure activity, orthopnea, PND,  pedal edema, claudication. Remaining systems are negative.  Physical Exam: Well-developed well-nourished in no acute distress.  Skin is warm and dry.  HEENT is normal.  Neck is supple.  Chest is clear to auscultation with normal expansion.  Cardiovascular exam is regular rate and rhythm.  Abdominal exam nontender or distended. No masses palpated. Extremities show no edema. neuro grossly intact  A/P  1 thoracic aortic aneurysm-status post repair-most recent CTA shows stable repair.  2 hypertension-blood pressure controlled.  Continue present medications and follow.  3 chest pain-no recurrent episodes.  Previous symptoms felt secondary to musculoskeletal pain.  4 postoperative atrial flutter-no recurrences.  Olga Millers, MD

## 2019-07-30 ENCOUNTER — Ambulatory Visit: Payer: BC Managed Care – PPO | Attending: Internal Medicine

## 2019-07-30 DIAGNOSIS — Z20822 Contact with and (suspected) exposure to covid-19: Secondary | ICD-10-CM

## 2019-07-31 LAB — NOVEL CORONAVIRUS, NAA: SARS-CoV-2, NAA: NOT DETECTED

## 2019-10-21 ENCOUNTER — Other Ambulatory Visit: Payer: Self-pay | Admitting: Cardiology

## 2019-10-21 DIAGNOSIS — I10 Essential (primary) hypertension: Secondary | ICD-10-CM

## 2019-10-23 ENCOUNTER — Encounter: Payer: Self-pay | Admitting: *Deleted

## 2020-01-06 ENCOUNTER — Other Ambulatory Visit: Payer: Self-pay | Admitting: Cardiology

## 2020-06-07 ENCOUNTER — Other Ambulatory Visit: Payer: Self-pay

## 2020-06-07 ENCOUNTER — Emergency Department (HOSPITAL_COMMUNITY)
Admission: EM | Admit: 2020-06-07 | Discharge: 2020-06-08 | Disposition: A | Discharge status: Home / Self Care | Payer: BC Managed Care – PPO | Attending: Emergency Medicine | Admitting: Emergency Medicine

## 2020-06-07 DIAGNOSIS — R112 Nausea with vomiting, unspecified: Secondary | ICD-10-CM | POA: Insufficient documentation

## 2020-06-07 DIAGNOSIS — Z20822 Contact with and (suspected) exposure to covid-19: Secondary | ICD-10-CM | POA: Diagnosis not present

## 2020-06-07 DIAGNOSIS — Z87891 Personal history of nicotine dependence: Secondary | ICD-10-CM | POA: Insufficient documentation

## 2020-06-07 DIAGNOSIS — R109 Unspecified abdominal pain: Secondary | ICD-10-CM

## 2020-06-07 DIAGNOSIS — Z79899 Other long term (current) drug therapy: Secondary | ICD-10-CM | POA: Insufficient documentation

## 2020-06-07 DIAGNOSIS — I10 Essential (primary) hypertension: Secondary | ICD-10-CM | POA: Diagnosis not present

## 2020-06-07 DIAGNOSIS — R197 Diarrhea, unspecified: Secondary | ICD-10-CM | POA: Insufficient documentation

## 2020-06-07 LAB — CBC
HCT: 47.6 % (ref 39.0–52.0)
Hemoglobin: 15.9 g/dL (ref 13.0–17.0)
MCH: 27.6 pg (ref 26.0–34.0)
MCHC: 33.4 g/dL (ref 30.0–36.0)
MCV: 82.6 fL (ref 80.0–100.0)
Platelets: 238 K/uL (ref 150–400)
RBC: 5.76 MIL/uL (ref 4.22–5.81)
RDW: 14.4 % (ref 11.5–15.5)
WBC: 4.8 K/uL (ref 4.0–10.5)
nRBC: 0 % (ref 0.0–0.2)

## 2020-06-07 LAB — COMPREHENSIVE METABOLIC PANEL WITH GFR
ALT: 24 U/L (ref 0–44)
AST: 25 U/L (ref 15–41)
Albumin: 4.1 g/dL (ref 3.5–5.0)
Alkaline Phosphatase: 40 U/L (ref 38–126)
Anion gap: 10 (ref 5–15)
BUN: 20 mg/dL (ref 6–20)
CO2: 25 mmol/L (ref 22–32)
Calcium: 8.9 mg/dL (ref 8.9–10.3)
Chloride: 102 mmol/L (ref 98–111)
Creatinine, Ser: 1.15 mg/dL (ref 0.61–1.24)
GFR, Estimated: >60 mL/min
Glucose, Bld: 109 mg/dL — ABNORMAL HIGH (ref 70–99)
Potassium: 3.8 mmol/L (ref 3.5–5.1)
Sodium: 137 mmol/L (ref 135–145)
Total Bilirubin: 0.6 mg/dL (ref 0.3–1.2)
Total Protein: 7.6 g/dL (ref 6.5–8.1)

## 2020-06-07 LAB — URINALYSIS, ROUTINE W REFLEX MICROSCOPIC
Bilirubin Urine: NEGATIVE
Glucose, UA: NEGATIVE mg/dL
Hgb urine dipstick: NEGATIVE
Ketones, ur: NEGATIVE mg/dL
Leukocytes,Ua: NEGATIVE
Nitrite: NEGATIVE
Protein, ur: NEGATIVE mg/dL
Specific Gravity, Urine: 1.026 (ref 1.005–1.030)
pH: 5 (ref 5.0–8.0)

## 2020-06-07 LAB — LIPASE, BLOOD: Lipase: 61 U/L — ABNORMAL HIGH (ref 11–51)

## 2020-06-07 NOTE — ED Triage Notes (Signed)
Pt with generalized abdominal pain, n/v/d since 2200 yesterday.

## 2020-06-08 ENCOUNTER — Emergency Department (HOSPITAL_COMMUNITY): Payer: BC Managed Care – PPO

## 2020-06-08 LAB — LACTIC ACID, PLASMA
Lactic Acid, Venous: 1.4 mmol/L (ref 0.5–1.9)
Lactic Acid, Venous: 1 mmol/L (ref 0.5–1.9)

## 2020-06-08 LAB — RESP PANEL BY RT-PCR (FLU A&B, COVID) ARPGX2
Influenza A by PCR: NEGATIVE
Influenza B by PCR: NEGATIVE
SARS Coronavirus 2 by RT PCR: NEGATIVE

## 2020-06-08 MED ORDER — MORPHINE SULFATE (PF) 4 MG/ML IV SOLN
4.0000 mg | Freq: Once | INTRAVENOUS | Status: AC
  Administered 2020-06-08: 4 mg via INTRAVENOUS
  Filled 2020-06-08: qty 1

## 2020-06-08 MED ORDER — ONDANSETRON HCL 4 MG/2ML IJ SOLN
4.0000 mg | Freq: Once | INTRAMUSCULAR | Status: AC
  Administered 2020-06-08: 4 mg via INTRAVENOUS
  Filled 2020-06-08: qty 2

## 2020-06-08 MED ORDER — ONDANSETRON HCL 4 MG PO TABS: 4.0000 mg | ORAL_TABLET | Freq: Three times a day (TID) | ORAL | 0 refills | Status: DC | PRN

## 2020-06-08 MED ORDER — SODIUM CHLORIDE 0.9 % IV BOLUS
1000.0000 mL | Freq: Once | INTRAVENOUS | Status: AC
  Administered 2020-06-08: 1000 mL via INTRAVENOUS

## 2020-06-08 MED ORDER — IOHEXOL 300 MG/ML  SOLN
100.0000 mL | Freq: Once | INTRAMUSCULAR | Status: AC | PRN
  Administered 2020-06-08: 100 mL via INTRAVENOUS

## 2020-06-08 NOTE — Discharge Instructions (Signed)
Your history and exam today were concerning for possible food poisoning versus irritation in your GI tract due to your oral intake last night.  Given your history of abdominal surgery and your description of symptoms, we did get labs and imaging to rule out bowel obstruction or partial bowel obstruction.  Imaging and labs are reassuring.  We feel you are now safe for discharge home.  Please maintain your hydration with the nausea medicine and follow-up with your primary doctor.  If any symptoms change or worsen, please return to the nearest emergency department.

## 2020-06-08 NOTE — ED Provider Notes (Signed)
[facility] Provider Note   CSN: [id number] Arrival date & time: 06/07/20  1631     History Chief Complaint  Patient presents with  . Abdominal Pain  . Emesis  . Diarrhea    Matthew GuilesFoster L Hoffman is a 54 y.o. male.  The history is provided by the patient and medical records. No language interpreter was used.  Abdominal Pain Pain location:  Generalized Pain quality: aching, bloating and cramping   Pain radiates to:  Does not radiate Pain severity:  Moderate Onset quality:  Gradual Duration:  1 day Timing:  Constant Progression:  Waxing and waning Chronicity:  New Relieved by:  Nothing Worsened by:  Nothing Ineffective treatments:  None tried Associated symptoms: belching, constipation, diarrhea (resolved), nausea and vomiting   Associated symptoms: no chest pain, no chills, no cough, no dysuria, no fatigue, no fever, no flatus and no shortness of breath   Risk factors: multiple surgeries        Past Medical History:  Diagnosis Date  . Arthritis   . Diverticulosis   . Hiatal hernia    small per ct 2016  . Hypertension   . Hypertrophy of prostate    mild  . OSA on CPAP    per pt moderate osa per study  . Sinus of Valsalva aneurysm   . Wears glasses     Patient Active Problem List   Diagnosis Date Noted  . Typical atrial flutter (HCC)   . Sinus of Valsalva aneurysm 09/08/2017  . Thoracic aortic aneurysm without rupture (HCC)   . Abdominal wall hernia 08/15/2017  . Hypertension 08/15/2017  . Sleep apnea 08/15/2017    Past Surgical History:  Procedure Laterality Date  . CARDIOVERSION N/A 09/15/2017   Procedure: CARDIOVERSION;  Surgeon: Wendall StadeNishan, Peter C, MD;  [facility];  Service: Cardiovascular;  Laterality: N/A;  . COLON RESECTION    . ELBOW SURGERY Right 1999  . KNEE ARTHROSCOPY Left 1995  . RIGHT/LEFT HEART CATH AND CORONARY ANGIOGRAPHY N/A 08/24/2017   Procedure: RIGHT/LEFT HEART CATH AND CORONARY  ANGIOGRAPHY;  Surgeon: Kathleene HazelMcAlhany, Kentrell Guettler D, MD;  Location: [facility] INVASIVE CV LAB;  Service: Cardiovascular;  Laterality: N/A;  . SHOULDER SURGERY Bilateral right 1992/  left 1987  . TEE WITHOUT CARDIOVERSION N/A 09/08/2017   Procedure: TRANSESOPHAGEAL ECHOCARDIOGRAM (TEE);  Surgeon: Loreli SlotHendrickson, Steven C, MD;  [facility];  Service: Open Heart Surgery;  Laterality: N/A;  . TEE WITHOUT CARDIOVERSION N/A 09/15/2017   Procedure: TRANSESOPHAGEAL ECHOCARDIOGRAM (TEE);  Surgeon: Wendall StadeNishan, Peter C, MD;  [facility];  Service: Cardiovascular;  Laterality: N/A;  . THORACIC AORTIC ANEURYSM REPAIR N/A 09/08/2017   Procedure: REPAIR OF SINUS OF VALSALVA ANEURYSM;  Surgeon: Loreli SlotHendrickson, Steven C, MD;  [facility];  Service: Open Heart Surgery;  Laterality: N/A;  Using 30mm Valsalva Gelweave Graft  . UMBILICAL HERNIA REPAIR  10/2015  . VASECTOMY Bilateral 06/10/2016   Procedure: VASECTOMY;  Surgeon: Malen GauzePatrick L McKenzie, MD;  [facility];  Service: Urology;  Laterality: Bilateral;       Family History  Problem Relation Age of Onset  . Hypertension Mother   . COPD Father     Social History   Tobacco Use  . Smoking status: Former Smoker    Years: 0.00    Types: Cigars  . Smokeless tobacco: Never Used  . Tobacco comment: average cigar 2 per month  Vaping Use  . Vaping Use: Never used  Substance Use Topics  .  Alcohol use: Yes    Comment: OCCASIONAL  . Drug use: No    Home Medications Prior to Admission medications   Medication Sig Start Date End Date Taking? Authorizing Provider  acetaminophen (TYLENOL) 325 MG tablet Take 2 tablets (650 mg total) by mouth every 6 (six) hours as needed for mild pain or moderate pain. 09/16/17   Sharlene Dory, PA-C  aspirin EC 81 MG tablet Take 81 mg by mouth daily.    [provider]  diltiazem (CARDIZEM CD) 180 MG 24 hr capsule TAKE 1 CAPSULE BY MOUTH EVERY DAY 01/08/20   Lewayne Bunting, MD  EPINEPHrine  (EPIPEN 2-PAK) 0.3 mg/0.3 mL IJ SOAJ injection Inject 0.3 mg into the muscle as needed (for allergic reaction).     [provider]  fluticasone (FLONASE) 50 MCG/ACT nasal spray Place 1 spray into both nostrils as needed. 09/04/18   [provider]  metoprolol tartrate (LOPRESSOR) 50 MG tablet TAKE 1 TABLET BY MOUTH TWICE DAILY 09/01/18   Lewayne Bunting, MD  Multiple Vitamin (MULTIVITAMIN) tablet Take 1 tablet by mouth daily. 50 plus    [provider]  sildenafil (VIAGRA) 100 MG tablet Take 100 mg by mouth as needed. 10/04/18   [provider]  valsartan-hydrochlorothiazide (DIOVAN-HCT) 320-12.5 MG tablet Take 1 tablet by mouth daily. 10/23/19   Lewayne Bunting, MD    Allergies    Shellfish allergy, Oxycodone, and Other  Review of Systems   Review of Systems  Constitutional: Negative for chills, diaphoresis, fatigue and fever.  HENT: Negative for congestion.   Respiratory: Negative for cough, chest tightness, shortness of breath, wheezing and stridor.   Cardiovascular: Negative for chest pain, palpitations and leg swelling.  Gastrointestinal: Positive for abdominal distention, abdominal pain, constipation, diarrhea (resolved), nausea and vomiting. Negative for flatus.  Genitourinary: Negative for dysuria, flank pain and frequency.  Musculoskeletal: Negative for back pain and neck pain.  Neurological: Negative for light-headedness and headaches.  Psychiatric/Behavioral: Negative for agitation and confusion.  All other systems reviewed and are negative.   Physical Exam Updated Vital Signs BP 119/78   Pulse 85   Temp 98.5 F (36.9 C) (Oral)   Resp 19   Ht 6' 2.5" (1.892 m)   Wt 129.3 kg   SpO2 95%   BMI 36.10 kg/m   Physical Exam Vitals and nursing note reviewed.  Constitutional:      General: He is not in acute distress.    Appearance: He is well-developed and well-nourished. He is not ill-appearing, toxic-appearing or diaphoretic.  HENT:      Head: Normocephalic and atraumatic.     Mouth/Throat:     Pharynx: Oropharynx is clear. No oropharyngeal exudate.  Eyes:     Conjunctiva/sclera: Conjunctivae normal.  Cardiovascular:     Rate and Rhythm: Normal rate and regular rhythm.     Heart sounds: No murmur heard.   Pulmonary:     Effort: Pulmonary effort is normal. No respiratory distress.     Breath sounds: Normal breath sounds.  Abdominal:     General: Abdomen is flat. A surgical scar is present. There is no distension.     Palpations: Abdomen is soft.     Tenderness: There is generalized abdominal tenderness. There is no right CVA tenderness, left CVA tenderness, guarding or rebound.  Musculoskeletal:        General: No edema.     Cervical back: Neck supple.  Skin:    General: Skin is warm and dry.  Capillary Refill: Capillary refill takes less than 2 seconds.     Coloration: Skin is not pale.     Findings: No rash.  Neurological:     General: No focal deficit present.     Mental Status: He is alert.  Psychiatric:        Mood and Affect: Mood and affect and mood normal.     ED Results / Procedures / Treatments   Labs (all labs ordered are listed, but only abnormal results are displayed) Labs Reviewed  LIPASE, BLOOD - Abnormal; Notable for the following components:      Result Value   Lipase 61 (*)    All other components within normal limits  COMPREHENSIVE METABOLIC PANEL - Abnormal; Notable for the following components:   Glucose, Bld 109 (*)    All other components within normal limits  RESP PANEL BY RT-PCR (FLU A&B, COVID) ARPGX2  CBC  URINALYSIS, ROUTINE W REFLEX MICROSCOPIC  LACTIC ACID, PLASMA  LACTIC ACID, PLASMA    EKG None  Radiology CT ABDOMEN PELVIS W CONTRAST  Result Date: 06/08/2020 CLINICAL DATA:  Bowel obstruction suspected. History of partial colectomy and diverticulitis. EXAM: CT ABDOMEN AND PELVIS WITH CONTRAST TECHNIQUE: Multidetector CT imaging of the abdomen and pelvis was  performed using the standard protocol following bolus administration of intravenous contrast. CONTRAST:  OMNIPAQUE IOHEXOL 300 MG/ML  SOLN COMPARISON:  July 25, 2017 FINDINGS: Lower chest: The lung bases are clear. The heart size is normal. Hepatobiliary: The liver is normal. Normal gallbladder.There is no biliary ductal dilation. Pancreas: Normal contours without ductal dilatation. No peripancreatic fluid collection. Spleen: Unremarkable. Adrenals/Urinary Tract: --Adrenal glands: Unremarkable. --Right kidney/ureter: No hydronephrosis or radiopaque kidney stones. --Left kidney/ureter: No hydronephrosis or radiopaque kidney stones. --Urinary bladder: There is a subtle filling defect in the posterolateral urinary bladder on the left (axial series 3, image 88, sagittal series 7, image 84). This is not well evaluated secondary to underdistention. Stomach/Bowel: --Stomach/Duodenum: No hiatal hernia or other gastric abnormality. Normal duodenal course and caliber. --Small bowel: Unremarkable. --Colon: The patient is status post prior right hemicolectomy. There is no evidence for a bowel obstruction. --Appendix: Surgically absent. Vascular/Lymphatic: Atherosclerotic calcification is present within the non-aneurysmal abdominal aorta, without hemodynamically significant stenosis. --No retroperitoneal lymphadenopathy. --No mesenteric lymphadenopathy. --No pelvic or inguinal lymphadenopathy. Reproductive: The prostate gland is enlarged. Other: No ascites or free air. The abdominal wall is normal. Musculoskeletal. No acute displaced fractures. IMPRESSION: 1. No acute abdominopelvic abnormality. 2. Status post right hemicolectomy. No evidence for a bowel obstruction. 3. Subtle filling defect in the posterolateral urinary bladder on the left. This is not well evaluated secondary to underdistention. Recommend correlation with urinalysis and cystoscopy. Aortic Atherosclerosis (ICD10-I70.0). Electronically Signed   By:  Katherine Mantle M.D.   On: 06/08/2020 02:33    Procedures Procedures (including critical care time)  Medications Ordered in ED Medications  sodium chloride 0.9 % bolus 1,000 mL (0 mLs Intravenous Stopped 06/08/20 0235)  morphine 4 MG/ML injection 4 mg (4 mg Intravenous Given 06/08/20 0136)  ondansetron (ZOFRAN) injection 4 mg (4 mg Intravenous Given 06/08/20 0135)  iohexol (OMNIPAQUE) 300 MG/ML solution 100 mL (100 mLs Intravenous Contrast Given 06/08/20 0217)    ED Course  I have reviewed the triage vital signs and the nursing notes.  Pertinent labs & imaging results that were available during my care of the patient were reviewed by me and considered in my medical decision making (see chart for details).    MDM Rules/Calculators/A&P  Matthew Hoffman is a 54 y.o. male with a past medical history significant for thoracic aortic aneurysm status post repair, prior diverticulitis with partial colon resection, hiatal hernia, prior atrial flutter not on anticoagulation, and hypertension who presents with nausea, vomiting, abdominal pain, abdominal bloating, decreased bowel movement, no flatus, and suspicious food intake.  Patient reports that last night, he had Chipotle, pizza, and eggnog which he reported look suspicious.  He says that last night he started having abdominal cramping and had nausea and vomiting.  He also had some nonbloody diarrhea however overnight he stopped having the diarrhea and is now not passed any gas or had any bowel movement.  He is concerned given his history of abdominal surgeries for partial obstruction.  He reports his belly feels bloated and he is having moderate diffuse abdominal pain.  He describes as a 7 out of 10 in severity on arrival.  Of note, patient was in the waiting for approximately 8 hours prior to my initial evaluation.  Patient reports his vomiting has improved but he is still having nausea.  He denies fevers, chills, chest  pain, shortness of breath, palpitations.  Denies any preceding symptoms before eating the food last night.  He denies any chest pain and does not radiate to his back.  On exam, abdomen is tender.  Bowel sounds were appreciated.  Lungs were clear and chest was nontender.  Backs and flanks nontender.  No CVA tenderness.  No rash seen.  Patient has surgical scars on his abdomen.  Intact lower extremity pulses present.  Clinically I do suspect this is likely bowel irritation in the setting of drinking eggnog which he thinks was spoiled.  However, given the patient's history of abdominal surgeries, the decreased flatus, bowel movements, and the abdominal pain, will get imaging to rule out bowel obstruction.  He will get some pain medicine, nausea medicine, and fluids initially.  Anticipate reassessment after work-up.  6:21 AM Work-up is returned and was reassuring.  Lactic acid negative x2.  Mild elevation in lipase however given the amount, do not suspect acute pancreatitis.  CT imaging showed prior hemicolectomy with no evidence of obstruction or other abnormality.  Based on his report of suspicious food intake, I do suspect either food poisoning versus gastroenteritis.  He reports that he is feeling better now after medications.  He was able to tolerate some ginger ale.  Given his well appearance, we feel he safe for discharge home now.  He was instructed to maintain hydration and given prescription for nausea medicine here.  He will follow-up with a PCP.  He understood return precautions and follow-up instructions and had no other questions or concerns.  Patient discharged in good condition.   Final Clinical Impression(s) / ED Diagnoses Final diagnoses:  Nausea vomiting and diarrhea  Abdominal cramping    Rx / DC Orders ED Discharge Orders         Ordered    ondansetron (ZOFRAN) 4 MG tablet  Every 8 hours PRN        06/08/20 0624         Clinical Impression: 1. Nausea vomiting and  diarrhea   2. Abdominal cramping     Disposition: Discharge  Condition: Good  I have discussed the results, Dx and Tx plan with the pt(& family if present). He/she/they expressed understanding and agree(s) with the plan. Discharge instructions discussed at great length. Strict return precautions discussed and pt &/or family have verbalized understanding of the instructions. No further  questions at time of discharge.    New Prescriptions   ONDANSETRON (ZOFRAN) 4 MG TABLET    Take 1 tablet (4 mg total) by mouth every 8 (eight) hours as needed for nausea or vomiting.    Follow Up: Ralene Ok, MD [address] [place] [address] [phone]     [facility] EMERGENCY DEPARTMENT [address] [id number] mc [place] [address] [phone]       Shemia Bevel, Canary Brim, MD 06/08/20 [phone]

## 2020-06-28 DIAGNOSIS — Z8616 Personal history of COVID-19: Secondary | ICD-10-CM

## 2020-06-28 HISTORY — DX: Personal history of COVID-19: Z86.16

## 2020-07-17 ENCOUNTER — Other Ambulatory Visit: Payer: Self-pay

## 2020-07-17 ENCOUNTER — Other Ambulatory Visit: Payer: BC Managed Care – PPO

## 2020-07-17 DIAGNOSIS — Z20822 Contact with and (suspected) exposure to covid-19: Secondary | ICD-10-CM

## 2020-07-19 LAB — NOVEL CORONAVIRUS, NAA: SARS-CoV-2, NAA: DETECTED — AB

## 2020-07-19 LAB — SARS-COV-2, NAA 2 DAY TAT

## 2020-07-22 ENCOUNTER — Other Ambulatory Visit: Payer: BC Managed Care – PPO

## 2020-07-22 DIAGNOSIS — Z20822 Contact with and (suspected) exposure to covid-19: Secondary | ICD-10-CM

## 2020-07-23 LAB — SARS-COV-2, NAA 2 DAY TAT

## 2020-07-23 LAB — NOVEL CORONAVIRUS, NAA: SARS-CoV-2, NAA: DETECTED — AB

## 2020-07-28 ENCOUNTER — Other Ambulatory Visit: Payer: Self-pay

## 2020-07-28 ENCOUNTER — Other Ambulatory Visit: Payer: BC Managed Care – PPO

## 2020-07-28 DIAGNOSIS — Z20822 Contact with and (suspected) exposure to covid-19: Secondary | ICD-10-CM

## 2020-07-29 LAB — SARS-COV-2, NAA 2 DAY TAT

## 2020-07-29 LAB — NOVEL CORONAVIRUS, NAA: SARS-CoV-2, NAA: NOT DETECTED

## 2020-08-14 ENCOUNTER — Other Ambulatory Visit: Payer: Self-pay | Admitting: Cardiology

## 2020-08-14 DIAGNOSIS — I10 Essential (primary) hypertension: Secondary | ICD-10-CM

## 2020-09-23 NOTE — Progress Notes (Deleted)
HPI: Follow-up thoracic aortic aneurysm. 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 Preoperative echocardiogram showed normal LV function, moderate diastolic dysfunction, aortic aneurysm with mild aortic insufficiency and moderate left atrial enlargement. Cardiac catheterization preoperatively showed no coronary disease. 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 Preoperative carotid Dopplers showed no significant stenosis. Patient had repair of sinus of Valsalva aneurysm using a graft with resuspension of aortic valve3/19. Postoperative course complicated by atrial flutter and patient had TEE guided cardioversion. Patient had dehiscence of his upper portion of the sternal wound and was treated with Keflex.Patient seen with chest pain April 2020. CTA showed no acute abnormalities with surgical changes of previous aneurysm repair. Abdominal CT December 2021 showed subtle filling defect in the posterior lateral urinary bladder and urinalysis/cystoscopy recommended.  Since last seen  Current Outpatient Medications  Medication Sig Dispense Refill  . diltiazem (CARDIZEM CD) 180 MG 24 hr capsule TAKE 1 CAPSULE BY MOUTH EVERY DAY (Patient taking differently: Take 180 mg by mouth daily.) 90 capsule 3  . EPINEPHrine 0.3 mg/0.3 mL IJ SOAJ injection Inject 0.3 mg into the muscle as needed (for allergic reaction).     . fluticasone (FLONASE) 50 MCG/ACT nasal spray Place 1 spray into both nostrils as needed for allergies.    . metoprolol tartrate (LOPRESSOR) 50 MG tablet TAKE 1 TABLET BY MOUTH TWICE DAILY (Patient not taking: No sig reported) 60 tablet 1  . Multiple Vitamin (MULTIVITAMIN) tablet Take 1 tablet by mouth daily. 50 plus    . ondansetron (ZOFRAN) 4 MG tablet Take 1 tablet (4 mg total) by mouth every 8 (eight) hours as needed for nausea or vomiting. 12 tablet 0  . valsartan-hydrochlorothiazide (DIOVAN-HCT) 320-12.5 MG  tablet TAKE 1 TABLET BY MOUTH DAILY 90 tablet 0   No current facility-administered medications for this visit.     Past Medical History:  Diagnosis Date  . Arthritis   . Diverticulosis   . Hiatal hernia    small per ct 2016  . Hypertension   . Hypertrophy of prostate    mild  . OSA on CPAP    per pt moderate osa per study  . Sinus of Valsalva aneurysm   . Wears glasses     Past Surgical History:  Procedure Laterality Date  . CARDIOVERSION N/A 09/15/2017   Procedure: CARDIOVERSION;  Surgeon: Wendall Stade, MD;  Location: [facility] ENDOSCOPY;  Service: Cardiovascular;  Laterality: N/A;  . COLON RESECTION    . ELBOW SURGERY Right 1999  . KNEE ARTHROSCOPY Left 1995  . RIGHT/LEFT HEART CATH AND CORONARY ANGIOGRAPHY N/A 08/24/2017   Procedure: RIGHT/LEFT HEART CATH AND CORONARY ANGIOGRAPHY;  Surgeon: Kathleene Hazel, MD;  Location: [facility] INVASIVE CV LAB;  Service: Cardiovascular;  Laterality: N/A;  . SHOULDER SURGERY Bilateral right 1992/  left 1987  . TEE WITHOUT CARDIOVERSION N/A 09/08/2017   Procedure: TRANSESOPHAGEAL ECHOCARDIOGRAM (TEE);  Surgeon: Loreli Slot, MD;  Location: [facility] OR;  Service: Open Heart Surgery;  Laterality: N/A;  . TEE WITHOUT CARDIOVERSION N/A 09/15/2017   Procedure: TRANSESOPHAGEAL ECHOCARDIOGRAM (TEE);  Surgeon: Wendall Stade, MD;  Location: [facility] ENDOSCOPY;  Service: Cardiovascular;  Laterality: N/A;  . THORACIC AORTIC ANEURYSM REPAIR N/A 09/08/2017   Procedure: REPAIR OF SINUS OF VALSALVA ANEURYSM;  Surgeon: Loreli Slot, MD;  Location: [facility] OR;  Service: Open Heart Surgery;  Laterality: N/A;  Using 10mm Valsalva Gelweave Graft  . UMBILICAL HERNIA REPAIR  10/2015  . VASECTOMY Bilateral 06/10/2016   Procedure: VASECTOMY;  Surgeon: Malen Gauze, MD;  Location: [facility];  Service: Urology;  Laterality: Bilateral;    Social History   Socioeconomic History  . Marital status: Divorced    Spouse name: Not on file  .  Number of children: 2  . Years of education: Not on file  . Highest education level: Not on file  Occupational History    Comment: Counseling  Tobacco Use  . Smoking status: Former Smoker    Years: 0.00    Types: Cigars  . Smokeless tobacco: Never Used  . Tobacco comment: average cigar 2 per month  Vaping Use  . Vaping Use: Never used  Substance and Sexual Activity  . Alcohol use: Yes    Comment: OCCASIONAL  . Drug use: No  . Sexual activity: Not on file  Other Topics Concern  . Not on file  Social History Narrative  . Not on file   Social Determinants of Health   Financial Resource Strain: Not on file  Food Insecurity: Not on file  Transportation Needs: Not on file  Physical Activity: Not on file  Stress: Not on file  Social Connections: Not on file  Intimate Partner Violence: Not on file    Family History  Problem Relation Age of Onset  . Hypertension Mother   . COPD Father     ROS: no fevers or chills, productive cough, hemoptysis, dysphasia, odynophagia, melena, hematochezia, dysuria, hematuria, rash, seizure activity, orthopnea, PND, pedal edema, claudication. Remaining systems are negative.  Physical Exam: Well-developed well-nourished in no acute distress.  Skin is warm and dry.  HEENT is normal.  Neck is supple.  Chest is clear to auscultation with normal expansion.  Cardiovascular exam is regular rate and rhythm.  Abdominal exam nontender or distended. No masses palpated. Extremities show no edema. neuro grossly intact  ECG- personally reviewed  A/P  1 thoracic aortic aneurysm-status post repair.  Continue blood pressure control.  2 hypertension-blood pressure controlled.  Continue present medical regimen.  3 history of postoperative atrial flutter-patient has had no documented recurrences.  4 filling defect noted in bladder on CT scan-follow-up primary care.  Olga Millers, MD

## 2020-09-24 NOTE — Progress Notes (Signed)
HPI: Follow-up thoracic aortic aneurysm. 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 Preoperative echocardiogram showed normal LV function, moderate diastolic dysfunction, aortic aneurysm with mild aortic insufficiency and moderate left atrial enlargement. Cardiac catheterization preoperatively showed no coronary disease. 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 Preoperative carotid Dopplers showed no significant stenosis. Patient had repair of sinus of Valsalva aneurysm using a graft with resuspension of aortic valve3/19. Postoperative course complicated by atrial flutter and patient had TEE guided cardioversion. Patient had dehiscence of his upper portion of the sternal wound and was treated with Keflex.Patient seen with chest pain April 2020. CTA showed no acute abnormalities with surgical changes of previous aneurysm repair. Abdominal CT December 2021 showed subtle filling defect in the posterior lateral urinary bladder and urinalysis/cystoscopy recommended.  Since last seenhe denies dyspnea, chest pain, palpitations or syncope.  Current Outpatient Medications  Medication Sig Dispense Refill  . diltiazem (CARDIZEM CD) 180 MG 24 hr capsule TAKE 1 CAPSULE BY MOUTH EVERY DAY (Patient taking differently: Take 180 mg by mouth daily.) 90 capsule 3  . EPINEPHrine 0.3 mg/0.3 mL IJ SOAJ injection Inject 0.3 mg into the muscle as needed (for allergic reaction).     . fluticasone (FLONASE) 50 MCG/ACT nasal spray Place 1 spray into both nostrils as needed for allergies.    . Multiple Vitamin (MULTIVITAMIN) tablet Take 1 tablet by mouth daily. 50 plus    . valsartan-hydrochlorothiazide (DIOVAN-HCT) 320-12.5 MG tablet TAKE 1 TABLET BY MOUTH DAILY 90 tablet 0   No current facility-administered medications for this visit.     Past Medical History:  Diagnosis Date  . Arthritis   . Diverticulosis   . Hiatal hernia    small  per ct 2016  . Hypertension   . Hypertrophy of prostate    mild  . OSA on CPAP    per pt moderate osa per study  . Sinus of Valsalva aneurysm   . Wears glasses     Past Surgical History:  Procedure Laterality Date  . CARDIOVERSION N/A 09/15/2017   Procedure: CARDIOVERSION;  Surgeon: Wendall Stade, MD;  Location: [facility] ENDOSCOPY;  Service: Cardiovascular;  Laterality: N/A;  . COLON RESECTION    . ELBOW SURGERY Right 1999  . KNEE ARTHROSCOPY Left 1995  . RIGHT/LEFT HEART CATH AND CORONARY ANGIOGRAPHY N/A 08/24/2017   Procedure: RIGHT/LEFT HEART CATH AND CORONARY ANGIOGRAPHY;  Surgeon: Kathleene Hazel, MD;  Location: [facility] INVASIVE CV LAB;  Service: Cardiovascular;  Laterality: N/A;  . SHOULDER SURGERY Bilateral right 1992/  left 1987  . TEE WITHOUT CARDIOVERSION N/A 09/08/2017   Procedure: TRANSESOPHAGEAL ECHOCARDIOGRAM (TEE);  Surgeon: Loreli Slot, MD;  Location: [facility] OR;  Service: Open Heart Surgery;  Laterality: N/A;  . TEE WITHOUT CARDIOVERSION N/A 09/15/2017   Procedure: TRANSESOPHAGEAL ECHOCARDIOGRAM (TEE);  Surgeon: Wendall Stade, MD;  Location: [facility] ENDOSCOPY;  Service: Cardiovascular;  Laterality: N/A;  . THORACIC AORTIC ANEURYSM REPAIR N/A 09/08/2017   Procedure: REPAIR OF SINUS OF VALSALVA ANEURYSM;  Surgeon: Loreli Slot, MD;  Location: [facility] OR;  Service: Open Heart Surgery;  Laterality: N/A;  Using 21mm Valsalva Gelweave Graft  . UMBILICAL HERNIA REPAIR  10/2015  . VASECTOMY Bilateral 06/10/2016   Procedure: VASECTOMY;  Surgeon: Malen Gauze, MD;  Location: [facility];  Service: Urology;  Laterality: Bilateral;    Social History   Socioeconomic History  . Marital status: Divorced  Spouse name: Not on file  . Number of children: 2  . Years of education: Not on file  . Highest education level: Not on file  Occupational History    Comment: Counseling  Tobacco Use  . Smoking status: Former Smoker    Years: 0.00    Types: Cigars   . Smokeless tobacco: Never Used  . Tobacco comment: average cigar 2 per month  Vaping Use  . Vaping Use: Never used  Substance and Sexual Activity  . Alcohol use: Yes    Comment: OCCASIONAL  . Drug use: No  . Sexual activity: Not on file  Other Topics Concern  . Not on file  Social History Narrative  . Not on file   Social Determinants of Health   Financial Resource Strain: Not on file  Food Insecurity: Not on file  Transportation Needs: Not on file  Physical Activity: Not on file  Stress: Not on file  Social Connections: Not on file  Intimate Partner Violence: Not on file    Family History  Problem Relation Age of Onset  . Hypertension Mother   . COPD Father     ROS: no fevers or chills, productive cough, hemoptysis, dysphasia, odynophagia, melena, hematochezia, dysuria, hematuria, rash, seizure activity, orthopnea, PND, pedal edema, claudication. Remaining systems are negative.  Physical Exam: Well-developed well-nourished in no acute distress.  Skin is warm and dry.  HEENT is normal.  Neck is supple.  Chest is clear to auscultation with normal expansion.  Cardiovascular exam is regular rate and rhythm.  Abdominal exam nontender or distended. No masses palpated. Extremities show no edema. neuro grossly intact  ECG-normal sinus rhythm at a rate of 63, normal axis, nonspecific T wave changes.  Personally reviewed  A/P  1 thoracic aortic aneurysm-status post repair.  Continue blood pressure control.  2 hypertension-blood pressure elevated; however he states typically controlled.  Continue present medications and advance if needed.  3 history of postoperative atrial flutter-patient has had no documented recurrences.  4 filling defect noted in bladder on CT scan-Per patient he has followed up with urology.  Olga Millers, MD

## 2020-09-29 ENCOUNTER — Ambulatory Visit: Payer: BC Managed Care – PPO | Admitting: Cardiology

## 2020-10-02 ENCOUNTER — Encounter: Payer: Self-pay | Admitting: Cardiology

## 2020-10-02 ENCOUNTER — Ambulatory Visit (INDEPENDENT_AMBULATORY_CARE_PROVIDER_SITE_OTHER): Payer: BC Managed Care – PPO | Admitting: Cardiology

## 2020-10-02 ENCOUNTER — Other Ambulatory Visit: Payer: Self-pay

## 2020-10-02 VITALS — BP 138/98 | HR 63 | Ht 74.0 in | Wt 294.8 lb

## 2020-10-02 DIAGNOSIS — I10 Essential (primary) hypertension: Secondary | ICD-10-CM

## 2020-10-02 DIAGNOSIS — I712 Thoracic aortic aneurysm, without rupture, unspecified: Secondary | ICD-10-CM

## 2020-10-02 NOTE — Patient Instructions (Signed)

## 2020-11-05 ENCOUNTER — Ambulatory Visit: Payer: BC Managed Care – PPO | Admitting: Urology

## 2020-11-10 ENCOUNTER — Other Ambulatory Visit: Payer: Self-pay | Admitting: Cardiology

## 2020-11-10 DIAGNOSIS — I10 Essential (primary) hypertension: Secondary | ICD-10-CM

## 2020-12-10 ENCOUNTER — Ambulatory Visit (INDEPENDENT_AMBULATORY_CARE_PROVIDER_SITE_OTHER): Payer: BC Managed Care – PPO | Admitting: Urology

## 2020-12-10 ENCOUNTER — Other Ambulatory Visit: Payer: Self-pay

## 2020-12-10 ENCOUNTER — Encounter: Payer: Self-pay | Admitting: Urology

## 2020-12-10 DIAGNOSIS — N5201 Erectile dysfunction due to arterial insufficiency: Secondary | ICD-10-CM

## 2020-12-10 DIAGNOSIS — R310 Gross hematuria: Secondary | ICD-10-CM | POA: Diagnosis not present

## 2020-12-10 MED ORDER — TADALAFIL 5 MG PO TABS: 5.0000 mg | ORAL_TABLET | ORAL | 11 refills | Status: DC | PRN

## 2020-12-10 NOTE — Patient Instructions (Signed)
Erectile Dysfunction Erectile dysfunction (ED) is the inability to get or keep an erection in order to have sexual intercourse. ED is considered a symptom of an underlying disorder and not considered a disease. Erectile dysfunction may include: Inability to get an erection. Lack of enough hardness of the erection to allow penetration. Loss of the erection before sex is finished. What are the causes? This condition may be caused by: Certain medicines, such as: Pain relievers. Antihistamines. Antidepressants. Blood pressure medicines. Water pills (diuretics). Ulcer medicines. Muscle relaxants. Drugs. Excessive drinking. Psychological causes, such as: Anxiety. Depression. Sadness. Exhaustion. Performance fear. Stress. Physical causes, such as: Artery problems. This may include diabetes, smoking, liver disease, or atherosclerosis. High blood pressure. Hormonal problems, such as low testosterone. Obesity. Nerve problems. This may include back or pelvic injuries, diabetes mellitus, multiple sclerosis, or Parkinson's disease. What are the signs or symptoms? Symptoms of this condition include: Inability to get an erection. Lack of enough hardness of the erection to allow penetration. Loss of the erection before sex is finished. Normal erections at some times, but with frequent unsatisfactory episodes. Low sexual satisfaction in either partner due to erection problems. A curved penis occurring with erection. The curve may cause pain or the penis may be too curved to allow for intercourse. Never having nighttime erections. How is this diagnosed? This condition is often diagnosed by: Performing a physical exam to find other diseases or specific problems with the penis. Asking you detailed questions about the problem. Performing blood tests to check for diabetes mellitus or to measure hormone levels. Performing other tests to check for underlying health conditions. Performing an  ultrasound exam to check for scarring. Performing a test to check blood flow to the penis. Doing a sleep study at home to measure nighttime erections. How is this treated? This condition may be treated by: Medicine taken by mouth to help you achieve an erection (oral medicine). Hormone replacement therapy to replace low testosterone levels. Medicine that is injected into the penis. Your health care provider may instruct you how to give yourself these injections at home. Vacuum pump. This is a pump with a ring on it. The pump and ring are placed on the penis and used to create pressure that helps the penis become erect. Penile implant surgery. In this procedure, you may receive: An inflatable implant. This consists of cylinders, a pump, and a reservoir. The cylinders can be inflated with a fluid that helps to create an erection, and they can be deflated after intercourse. A semi-rigid implant. This consists of two silicone rubber rods. The rods provide some rigidity. They are also flexible, so the penis can both curve downward in its normal position and become straight for sexual intercourse. Blood vessel surgery, to improve blood flow to the penis. During this procedure, a blood vessel from a different part of the body is placed into the penis to allow blood to flow around (bypass) damaged or blocked blood vessels. Lifestyle changes, such as exercising more, losing weight, and quitting smoking. Follow these instructions at home: Medicines  Take over-the-counter and prescription medicines only as told by your health care provider. Do not increase the dosage without first discussing it with your health care provider. If you are using self-injections, perform injections as directed by your health care provider. Make sure to avoid any veins that are on the surface of the penis. After giving an injection, apply pressure to the injection site for 5 minutes.  General instructions Exercise regularly, as    directed by your health care provider. Work with your health care provider to lose weight, if needed. Do not use any products that contain nicotine or tobacco, such as cigarettes and e-cigarettes. If you need help quitting, ask your health care provider. Before using a vacuum pump, read the instructions that come with the pump and discuss any questions with your health care provider. Keep all follow-up visits as told by your health care provider. This is important. Contact a health care provider if: You feel nauseous. You vomit. Get help right away if: You are taking oral or injectable medicines and you have an erection that lasts longer than 4 hours. If your health care provider is unavailable, go to the nearest emergency room for evaluation. An erection that lasts much longer than 4 hours can result in permanent damage to your penis. You have severe pain in your groin or abdomen. You develop redness or severe swelling of your penis. You have redness spreading up into your groin or lower abdomen. You are unable to urinate. You experience chest pain or a rapid heart beat (palpitations) after taking oral medicines. Summary Erectile dysfunction (ED) is the inability to get or keep an erection during sexual intercourse. This problem can usually be treated successfully. This condition is diagnosed based on a physical exam, your symptoms, and tests to determine the cause. Treatment varies depending on the cause and may include medicines, hormone therapy, surgery, or a vacuum pump. You may need follow-up visits to make sure that you are using your medicines or devices correctly. Get help right away if you are taking or injecting medicines and you have an erection that lasts longer than 4 hours. This information is not intended to replace advice given to you by your health care provider. Make sure you discuss any questions you have with your healthcare provider. Document Revised: 06/03/2020 Document  Reviewed: 02/29/2020 Elsevier Patient Education  2022 [employer]  

## 2020-12-10 NOTE — Progress Notes (Signed)
Urological Symptom Review  Patient is experiencing the following symptoms:  Gross hematuria 1 year fol/up   Review of Systems  Gastrointestinal (upper)  : Negative for upper GI symptoms  Gastrointestinal (lower) : Negative for lower GI symptoms  Constitutional : Negative for symptoms  Skin: Negative for skin symptoms  Eyes: Negative for eye symptoms  Ear/Nose/Throat : Negative for Ear/Nose/Throat symptoms  Hematologic/Lymphatic: Negative for Hematologic/Lymphatic symptoms  Cardiovascular : Negative for cardiovascular symptoms  Respiratory : Negative for respiratory symptoms  Endocrine: Negative for endocrine symptoms  Musculoskeletal: Negative for musculoskeletal symptoms  Neurological: Negative for neurological symptoms  Psychologic: Negative for psychiatric symptoms

## 2020-12-10 NOTE — Progress Notes (Signed)
12/10/2020 11:30 AM   Matthew Hoffman 1966/02/10 [id number]  Referring provider: Ralene Ok, MD [id number] [place],  [place] [address]  Followup gross hematuria   HPI: Matthew Hoffman is a 54yo here for followup for gross hematuria. NO recent gross hematuria. He was seen by Dr. Liliane Shi for a bladder filling defect on CT and was found to have a external compression near the left ureteral orifice. No worsening LUTS. No dysuria. He doe snot have any issues with ED currently.  His records from [facility] are as follows: I have blood in my urine.  HPI: Matthew Hoffman is a 55 year-old male established patient who is here for blood in the urine.  He did see the blood in his urine. He has seen blood clots.   He does not have a burning sensation when he urinates. He is not currently having trouble urinating.   He is not having pain. He has not recently had unwanted weight loss.   10/05/2018: He developed light pink urine 1 week ago when he was taking beet juice. He new LUTS. UA today shows no blood. No flank pain.   11/01/2019: NO new LUTS. UA negative fro blood. No gross hematuria since last visit     CC: I am having trouble with my erections.  HPI: He first stated noticing pain on approximately 08/27/2018. His symptoms did not begin gradually. His symptoms did begin suddenly. His symptoms have been stable over the last year.   He does have difficulties achieving an erection. He does not have problems maintaining his erections. His erections are straight.   He does not have premature ejaculation. He does not have trouble reaching climax. He does have anxiety because of the symptoms.   10/05/2018: Since starting metoprolol he noted issues getting an erection. He was switched to diltiazem which improved his ED   11/01/2019: No issues with ED since last vist     IIEF-5 Score: The patient's confidence that he can get an erection is high. The patient's erections were hard enough for penetration  sometimes. The patient was able to maintain his erection after he had penetrated his partner sometimes. During sexual intercouse, it was difficult to maintain his erection to the completion of intercourse. The patient found sexual intercourse satisfactory sometimes.   Calculated IIEF-5 Symptom Score: 16    PMH: Past Medical History:  Diagnosis Date   Arthritis    Diverticulosis    Hiatal hernia    small per ct 2016   Hypertension    Hypertrophy of prostate    mild   OSA on CPAP    per pt moderate osa per study   Sinus of Valsalva aneurysm    Wears glasses     Surgical History: Past Surgical History:  Procedure Laterality Date   CARDIOVERSION N/A 09/15/2017   Procedure: CARDIOVERSION;  Surgeon: Wendall Stade, MD;  Location: [facility] ENDOSCOPY;  Service: Cardiovascular;  Laterality: N/A;   COLON RESECTION     ELBOW SURGERY Right 1999   KNEE ARTHROSCOPY Left 1995   RIGHT/LEFT HEART CATH AND CORONARY ANGIOGRAPHY N/A 08/24/2017   Procedure: RIGHT/LEFT HEART CATH AND CORONARY ANGIOGRAPHY;  Surgeon: Kathleene Hazel, MD;  Location: [facility] INVASIVE CV LAB;  Service: Cardiovascular;  Laterality: N/A;   SHOULDER SURGERY Bilateral right 1992/  left 1987   TEE WITHOUT CARDIOVERSION N/A 09/08/2017   Procedure: TRANSESOPHAGEAL ECHOCARDIOGRAM (TEE);  Surgeon: Loreli Slot, MD;  Location: [facility] OR;  Service: Open Heart Surgery;  Laterality: N/A;  TEE WITHOUT CARDIOVERSION N/A 09/15/2017   Procedure: TRANSESOPHAGEAL ECHOCARDIOGRAM (TEE);  Surgeon: Wendall Stade, MD;  Location: [facility] ENDOSCOPY;  Service: Cardiovascular;  Laterality: N/A;   THORACIC AORTIC ANEURYSM REPAIR N/A 09/08/2017   Procedure: REPAIR OF SINUS OF VALSALVA ANEURYSM;  Surgeon: Loreli Slot, MD;  Location: [facility] OR;  Service: Open Heart Surgery;  Laterality: N/A;  Using 35mm Valsalva Gelweave Graft   UMBILICAL HERNIA REPAIR  10/2015   VASECTOMY Bilateral 06/10/2016   Procedure: VASECTOMY;  Surgeon: Malen Gauze,  MD;  Location: [facility];  Service: Urology;  Laterality: Bilateral;    Home Medications:  Allergies as of 12/10/2020       Reactions   Shellfish Allergy Hives, Swelling, Rash   SWELLING REACTION UNSPECIFIED  SWELLING REACTION UNSPECIFIED    Oxycodone Other (See Comments)   Having severe hallucinations   Other Rash        Medication List        Accurate as of December 10, 2020 11:30 AM. If you have any questions, ask your nurse or doctor.          diltiazem 180 MG 24 hr capsule Commonly known as: CARDIZEM CD TAKE 1 CAPSULE BY MOUTH EVERY DAY What changed: how much to take   EPINEPHrine 0.3 mg/0.3 mL Soaj injection Commonly known as: EPI-PEN Inject 0.3 mg into the muscle as needed (for allergic reaction).   fluticasone 50 MCG/ACT nasal spray Commonly known as: FLONASE Place 1 spray into both nostrils as needed for allergies.   multivitamin tablet Take 1 tablet by mouth daily. 50 plus   valsartan-hydrochlorothiazide 320-12.5 MG tablet Commonly known as: DIOVAN-HCT TAKE 1 TABLET BY MOUTH DAILY        Allergies:  Allergies  Allergen Reactions   Shellfish Allergy Hives, Swelling and Rash    SWELLING REACTION UNSPECIFIED  SWELLING REACTION UNSPECIFIED    Oxycodone Other (See Comments)    Having severe hallucinations   Other Rash    Family History: Family History  Problem Relation Age of Onset   Hypertension Mother    COPD Father     Social History:  reports that he has quit smoking. His smoking use included cigars. He has never used smokeless tobacco. He reports current alcohol use. He reports that he does not use drugs.  ROS: All other review of systems were reviewed and are negative except what is noted above in HPI  Physical Exam: There were no vitals taken for this visit.  Constitutional:  Alert and oriented, No acute distress. HEENT: [place] AT, moist mucus membranes.  Trachea midline, no masses. Cardiovascular: No clubbing,  cyanosis, or edema. Respiratory: Normal respiratory effort, no increased work of breathing. GI: Abdomen is soft, nontender, nondistended, no abdominal masses GU: No CVA tenderness.  Lymph: No cervical or inguinal lymphadenopathy. Skin: No rashes, bruises or suspicious lesions. Neurologic: Grossly intact, no focal deficits, moving all 4 extremities. Psychiatric: Normal mood and affect.  Laboratory Data: Lab Results  Component Value Date   WBC 4.8 06/07/2020   HGB 15.9 06/07/2020   HCT 47.6 06/07/2020   MCV 82.6 06/07/2020   PLT 238 06/07/2020    Lab Results  Component Value Date   CREATININE 1.15 06/07/2020    No results found for: PSA  No results found for: TESTOSTERONE  Lab Results  Component Value Date   HGBA1C 5.8 (H) 09/06/2017    Urinalysis    Component Value Date/Time   COLORURINE YELLOW 06/07/2020 1802   APPEARANCEUR  CLEAR 06/07/2020 1802   APPEARANCEUR Clear 10/04/2017 0829   LABSPEC 1.026 06/07/2020 1802   PHURINE 5.0 06/07/2020 1802   GLUCOSEU NEGATIVE 06/07/2020 1802   HGBUR NEGATIVE 06/07/2020 1802   BILIRUBINUR NEGATIVE 06/07/2020 1802   BILIRUBINUR Negative 10/04/2017 0829   KETONESUR NEGATIVE 06/07/2020 1802   PROTEINUR NEGATIVE 06/07/2020 1802   NITRITE NEGATIVE 06/07/2020 1802   LEUKOCYTESUR NEGATIVE 06/07/2020 1802    No results found for: LABMICR, WBCUA, RBCUA, LABEPIT, MUCUS, BACTERIA  Pertinent Imaging:  Results for orders placed during the hospital encounter of 01/13/04  DG Abd 1 View  Narrative CLINICAL DATA: Mid abdominal pain. History of diverticulosis. SINGLE VIEW ABDOMEN: Unremarkable bowel gas pattern. Multiple calcifications in the anatomic pelvis probably represent phlebolith. Degenerative changes in the hips are noted right greater than left.  Impression Unremarkable bowel gas pattern.  Provider: Kellie Moor  No results found for this or any previous visit.  No results found for this or any previous  visit.  No results found for this or any previous visit.  No results found for this or any previous visit.  No results found for this or any previous visit.  No results found for this or any previous visit.  No results found for this or any previous visit.   Assessment & Plan:    1. Gross hematuria -resolved. RTC 6 months for cystoscopy  2. Erectile dysfunction due to arterial insufficiency -tadalafil 5mg  prn   No follow-ups on file.  , MD  [facility] [place]

## 2020-12-11 LAB — URINALYSIS, ROUTINE W REFLEX MICROSCOPIC
Bilirubin, UA: NEGATIVE
Glucose, UA: NEGATIVE
Ketones, UA: NEGATIVE
Leukocytes,UA: NEGATIVE
Nitrite, UA: NEGATIVE
Protein,UA: NEGATIVE
RBC, UA: NEGATIVE
Specific Gravity, UA: 1.02 (ref 1.005–1.030)
Urobilinogen, Ur: 0.2 mg/dL (ref 0.2–1.0)
pH, UA: 5.5 (ref 5.0–7.5)

## 2021-01-08 ENCOUNTER — Other Ambulatory Visit: Payer: Self-pay | Admitting: Cardiology

## 2021-01-15 IMAGING — CT CT ABD-PELV W/ CM
2 of 5 series · 16 of 46 positions shown, 18 images · IV contrast (omnipaque)
Comparison: July 25, 2017

CLINICAL DATA: Bowel obstruction suspected. History of partial
colectomy and diverticulitis.

EXAM:
CT ABDOMEN AND PELVIS WITH CONTRAST
TECHNIQUE: Multidetector CT imaging of the abdomen and pelvis was performed
using the standard protocol following bolus administration of
intravenous contrast.
CONTRAST:  100mL OMNIPAQUE IOHEXOL 300 MG/ML  SOLN

[Series 3: abdomen 5.0 · axial · 0.98mm/px · z∈[+736,+1221]mm · 13 of 113 slices shown, 15 images]
[im 8/113  soft-tissue]
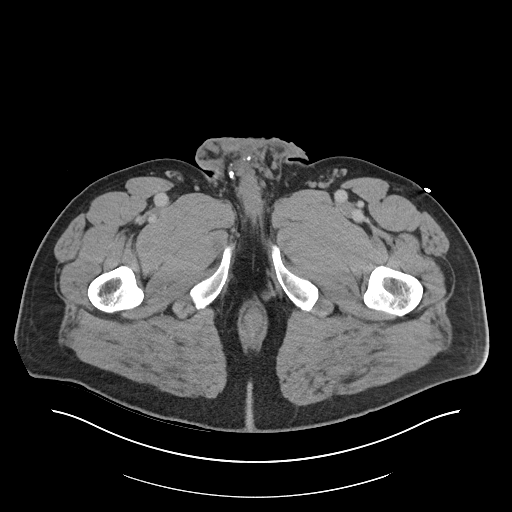
[im 8/113  bone]
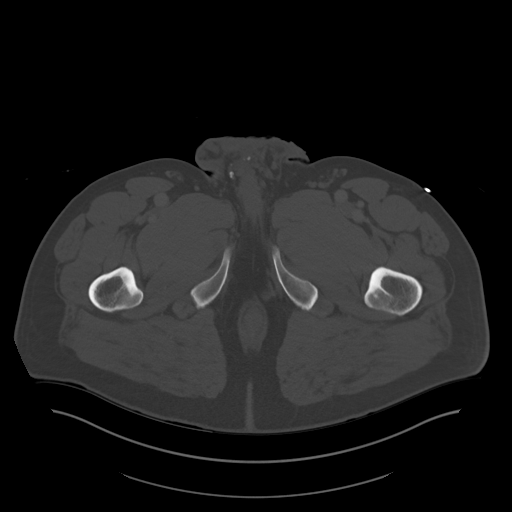
[im 15/113  soft-tissue]
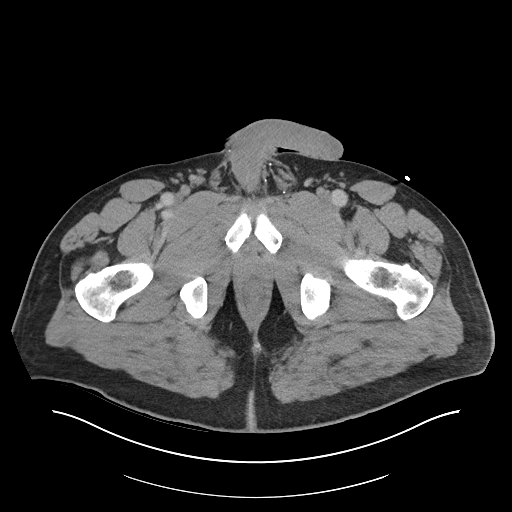
[im 23/113  soft-tissue]
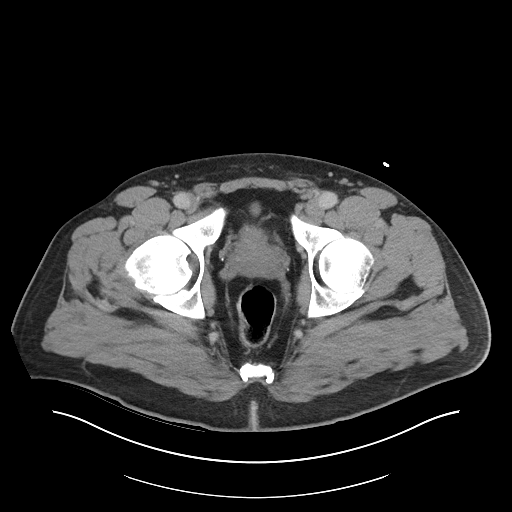
[im 30/113  soft-tissue]
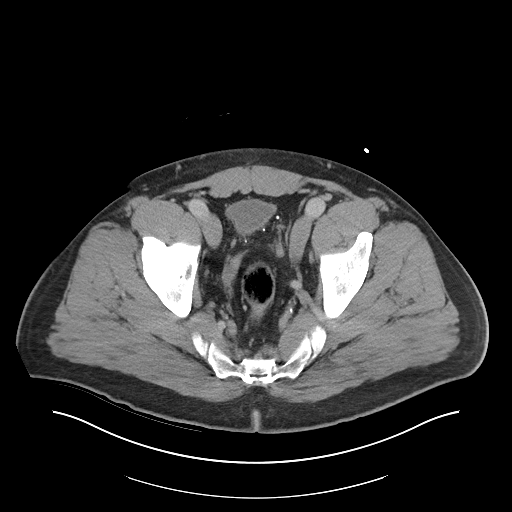
[im 38/113  soft-tissue]
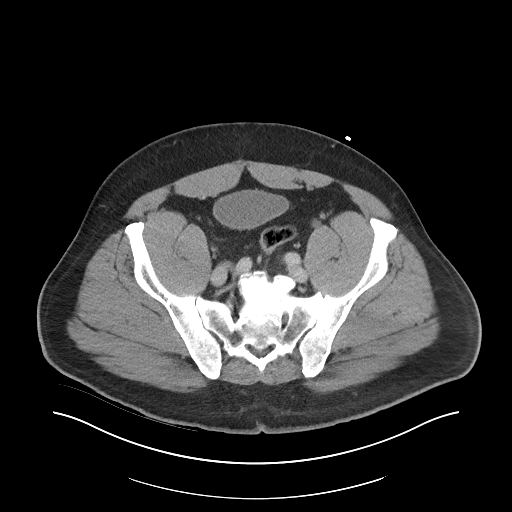
[im 45/113  soft-tissue]
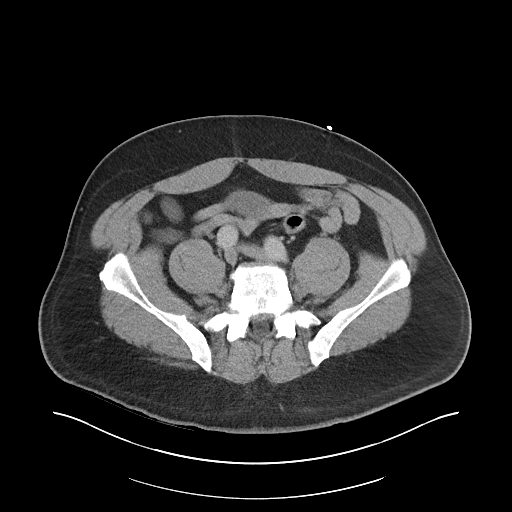
[im 60/113  soft-tissue]
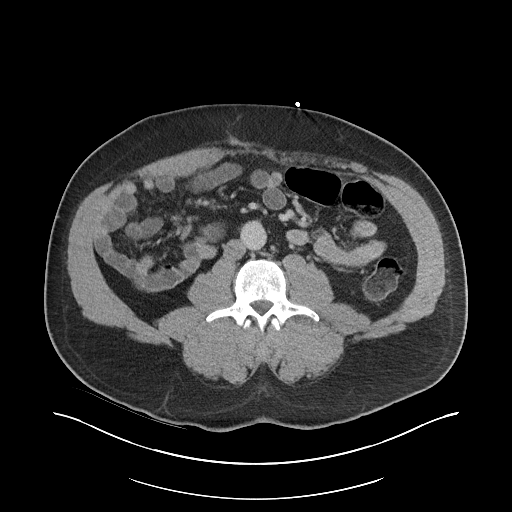
[im 68/113  soft-tissue]
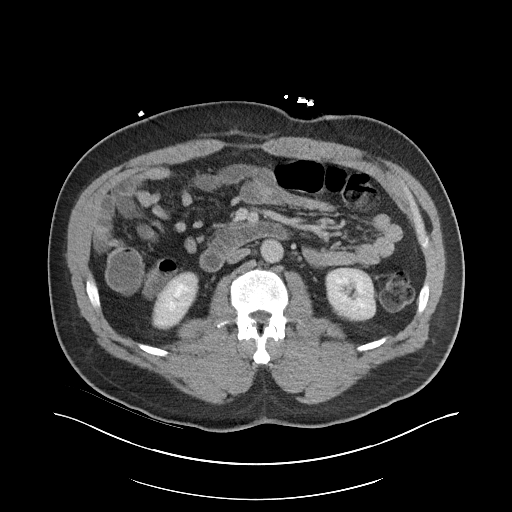
[im 75/113  soft-tissue]
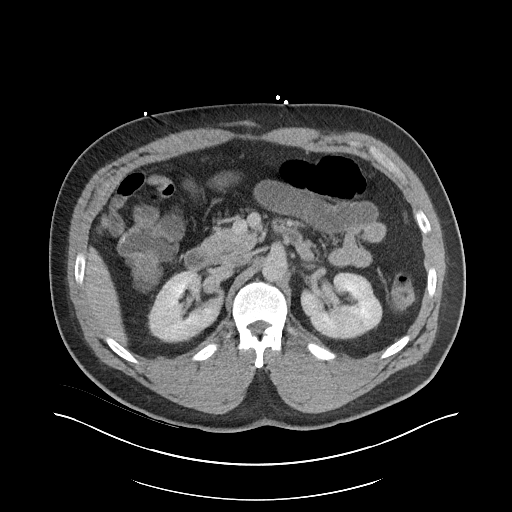
[im 75/113  bone]
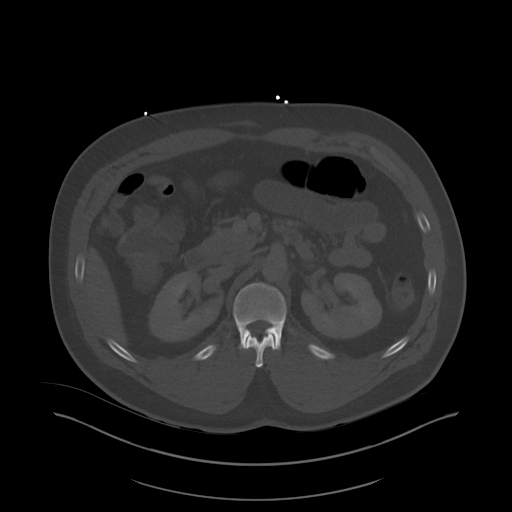
[im 83/113  soft-tissue]
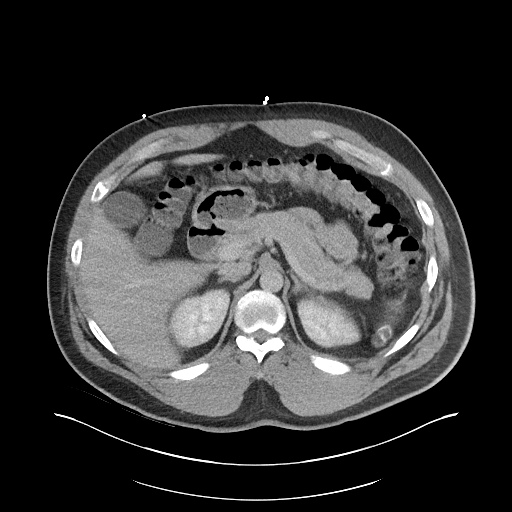
[im 90/113  soft-tissue]
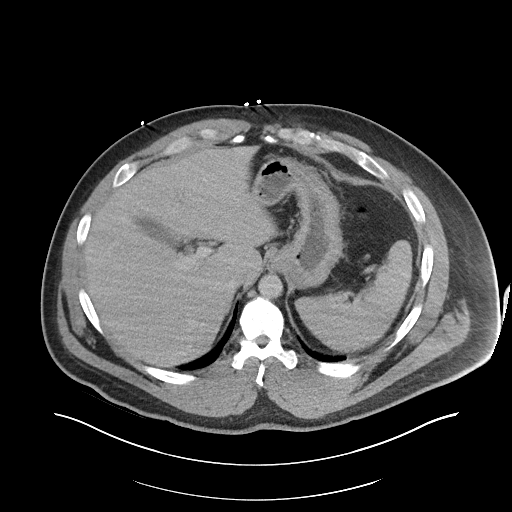
[im 98/113  soft-tissue]
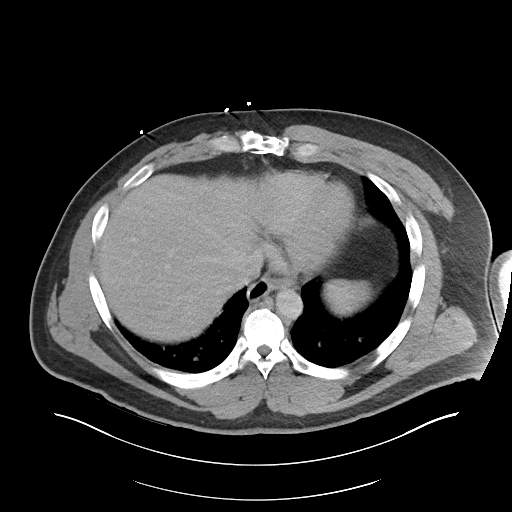
[im 105/113  soft-tissue]
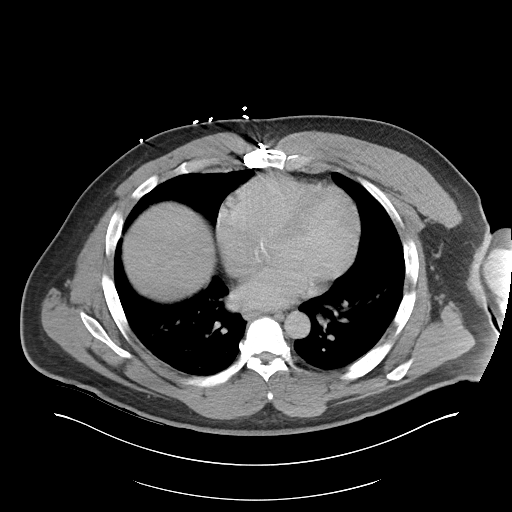

[Series 6: abdomen 3.0 mpr cor · coronal · 0.96mm/px · 3 of 112 slices shown]
[im 38/112  soft-tissue]
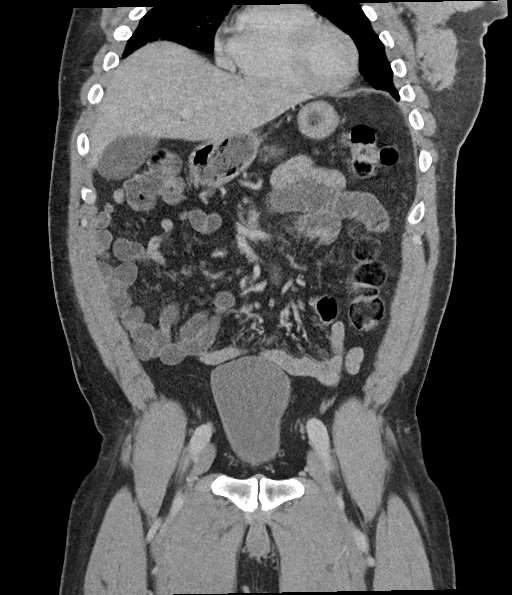
[im 50/112  soft-tissue]
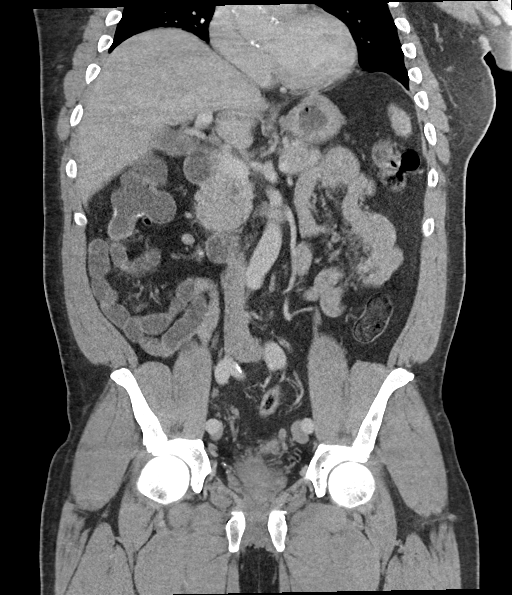
[im 62/112  soft-tissue]
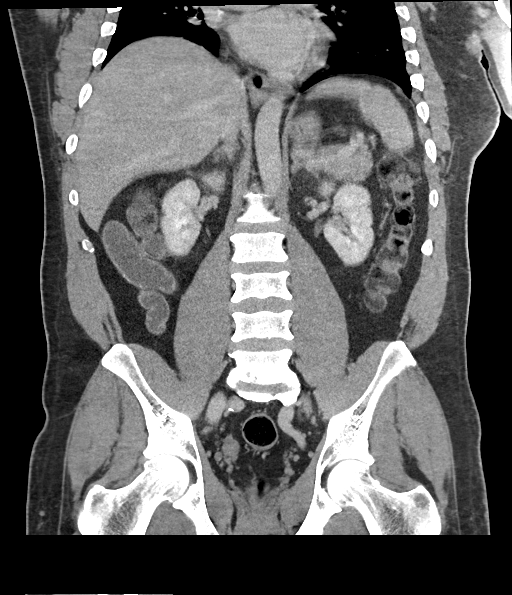

[16 of 46 positions shown; findings below may reference images not displayed]

FINDINGS: Lower chest: The lung bases are clear. The heart size is normal.

Hepatobiliary: The liver is normal. Normal gallbladder.There is no
biliary ductal dilation.

Pancreas: Normal contours without ductal dilatation. No
peripancreatic fluid collection.

Spleen: Unremarkable.

Adrenals/Urinary Tract:

--Adrenal glands: Unremarkable.

--Right kidney/ureter: No hydronephrosis or radiopaque kidney
stones.

--Left kidney/ureter: No hydronephrosis or radiopaque kidney stones.

--Urinary bladder: There is a subtle filling defect in the
posterolateral urinary bladder on the left (axial series 3, image
88, sagittal series 7, image 84). This is not well evaluated
secondary to underdistention.

Stomach/Bowel:

--Stomach/Duodenum: No hiatal hernia or other gastric abnormality.
Normal duodenal course and caliber.

--Small bowel: Unremarkable.

--Colon: The patient is status post prior right hemicolectomy. There
is no evidence for a bowel obstruction.

--Appendix: Surgically absent.

Vascular/Lymphatic: Atherosclerotic calcification is present within
the non-aneurysmal abdominal aorta, without hemodynamically
significant stenosis.

--No retroperitoneal lymphadenopathy.

--No mesenteric lymphadenopathy.

--No pelvic or inguinal lymphadenopathy.

Reproductive: The prostate gland is enlarged.

Other: No ascites or free air. The abdominal wall is normal.

Musculoskeletal. No acute displaced fractures.
IMPRESSION: 1. No acute abdominopelvic abnormality.
2. Status post right hemicolectomy. No evidence for a bowel
obstruction.
3. Subtle filling defect in the posterolateral urinary bladder on
the left. This is not well evaluated secondary to underdistention.
Recommend correlation with urinalysis and cystoscopy.

Aortic Atherosclerosis ([id number]).

## 2021-02-08 ENCOUNTER — Other Ambulatory Visit: Payer: Self-pay | Admitting: Cardiology

## 2021-02-08 DIAGNOSIS — I10 Essential (primary) hypertension: Secondary | ICD-10-CM

## 2021-02-22 ENCOUNTER — Other Ambulatory Visit: Payer: Self-pay | Admitting: Cardiology

## 2021-02-22 DIAGNOSIS — I10 Essential (primary) hypertension: Secondary | ICD-10-CM

## 2021-03-09 ENCOUNTER — Telehealth: Payer: Self-pay

## 2021-03-09 NOTE — Telephone Encounter (Signed)
   Name: Matthew Hoffman  DOB: 03-Nov-1965  MRN: [id number]   Primary Cardiologist: Olga Millers, MD  Chart reviewed as part of pre-operative protocol coverage. Patient was contacted 03/09/2021 in reference to pre-operative risk assessment for pending surgery as outlined below.  Matthew Hoffman was last seen on 10/02/20 by Dr. Jens Som.  Since that day, Matthew Hoffman has done well.  He does not have a history of CAD by heart cath in 2019. He can complete over 4.0 METS without angina.   Therefore, based on ACC/AHA guidelines, the patient would be at acceptable risk for the planned procedure without further cardiovascular testing.   The patient was advised that if he develops new symptoms prior to surgery to contact our office to arrange for a follow-up visit, and he verbalized understanding.  I will route this recommendation to the requesting party via Epic fax function and remove from pre-op pool. Please call with questions.  Matthew Rutherford Denia Mcvicar, PA 03/09/2021, 4:26 PM

## 2021-03-09 NOTE — Telephone Encounter (Signed)
   [facility] Pre-operative Risk Assessment    Patient Name: Matthew Hoffman  DOB: April 12, 1966 MRN: [id number]  HEARTCARE STAFF:  - IMPORTANT!!!!!! Under Visit Info/Reason for Call, type in Other and utilize the format Clearance MM/DD/YY or Clearance TBD. Do not use dashes or single digits. - Please review there is not already an duplicate clearance open for this procedure. - If request is for dental extraction, please clarify the # of teeth to be extracted. - If the patient is currently at the dentist's office, call Pre-Op Callback Staff (MA/nurse) to input urgent request.  - If the patient is not currently in the dentist office, please route to the Pre-Op pool.  Request for surgical clearance:  What type of surgery is being performed? Left Knee Scope Menisectomies   When is this surgery scheduled? 05/14/21  What type of clearance is required (medical clearance vs. Pharmacy clearance to hold med vs. Both)? Medical clearance   Are there any medications that need to be held prior to surgery and how long? None listed  Practice name and name of physician performing surgery? [facility], Dr. Edmonia Lynch  What is the office phone number? [email or web address]   7.   What is the office fax number? [phone] Attn: Claiborne Billings  8.   Anesthesia type (None, local, MAC, general) ?    Jacqulynn Cadet 03/09/2021, 10:58 AM  _________________________________________________________________   (provider comments below)

## 2021-05-14 HISTORY — PX: OTHER SURGICAL HISTORY: SHX169

## 2021-06-10 ENCOUNTER — Other Ambulatory Visit: Payer: BC Managed Care – PPO | Admitting: Urology

## 2021-06-24 ENCOUNTER — Other Ambulatory Visit: Payer: Self-pay | Admitting: Urology

## 2021-07-02 ENCOUNTER — Encounter (HOSPITAL_BASED_OUTPATIENT_CLINIC_OR_DEPARTMENT_OTHER): Payer: Self-pay | Admitting: Urology

## 2021-07-03 ENCOUNTER — Other Ambulatory Visit: Payer: Self-pay

## 2021-07-03 ENCOUNTER — Encounter (HOSPITAL_BASED_OUTPATIENT_CLINIC_OR_DEPARTMENT_OTHER): Payer: Self-pay | Admitting: Urology

## 2021-07-03 NOTE — Progress Notes (Addendum)
Spoke w/ via phone for pre-op interview---pt Lab needs dos---- I stat              COVID test -----patient states asymptomatic no test needed Arrive at -------630 am 07-08-2021 NPO after MN NO Solid Food.  Clear liquids from MN until---530 am Med rec completed Medications to take morning of surgery -----diltiazem, atorvastatin Diabetic medication -----n/a Patient instructed no nail polish to be worn day of surgery Patient instructed to bring photo id and insurance card day of surgery Patient aware to have Driver (ride ) / caregiver    for 24 hours after surgery driver friend Magazine features editor, caregiver: friend jeff or daughter Patient Special Instructions -----bring cpap mask tubing and machine and leave in car Pre-Op special Istructions -----none Patient verbalized understanding of instructions that were given at this phone interview. Patient denies shortness of breath, chest pain, fever, cough at this phone interview.    Ekg 10-02-2020 chart/epic Echo tee 09-15-2017 Pf test 09-06-2017 Cardiac cath 08-24-2017 [facility] cardiology dr crensahw 10-02-2020 epic [facility] cardiothoracic dr s hendrickson 01-17-2018 f/u prn Ct angio chest 10-15-2018 epic  Patient requested dos instructions to be email, [employer] and pt relied he had received email with [employer], email placed on pt chart

## 2021-07-07 NOTE — Anesthesia Preprocedure Evaluation (Addendum)
Anesthesia Evaluation  Patient identified by MRN, date of birth, ID band Patient awake    Reviewed: Allergy & Precautions, NPO status , Patient's Chart, lab work & pertinent test results  Airway Mallampati: II  TM Distance: >3 FB Neck ROM: Full    Dental no notable dental hx.    Pulmonary sleep apnea and Continuous Positive Airway Pressure Ventilation , former smoker,    Pulmonary exam normal breath sounds clear to auscultation       Cardiovascular Exercise Tolerance: Good hypertension, Pt. on medications Normal cardiovascular exam Rhythm:Regular Rate:Normal  H/o aneurysm repair in 2019   Neuro/Psych negative neurological ROS  negative psych ROS   GI/Hepatic Neg liver ROS, hiatal hernia,   Endo/Other  negative endocrine ROS  Renal/GU negative Renal ROS   Bladder lesion    Musculoskeletal  (+) Arthritis ,   Abdominal   Peds  Hematology negative hematology ROS (+)   Anesthesia Other Findings   Reproductive/Obstetrics negative OB ROS                            Anesthesia Physical Anesthesia Plan  ASA: 2  Anesthesia Plan: General   Post-op Pain Management:    Induction: Intravenous  PONV Risk Score and Plan: 2 and Treatment may vary due to age or medical condition  Airway Management Planned: LMA  Additional Equipment:   Intra-op Plan:   Post-operative Plan: Extubation in OR  Informed Consent: I have reviewed the patients History and Physical, chart, labs and discussed the procedure including the risks, benefits and alternatives for the proposed anesthesia with the patient or authorized representative who has indicated his/her understanding and acceptance.     Dental advisory given  Plan Discussed with: Anesthesiologist and CRNA  Anesthesia Plan Comments:        Anesthesia Quick Evaluation

## 2021-07-08 ENCOUNTER — Encounter (HOSPITAL_BASED_OUTPATIENT_CLINIC_OR_DEPARTMENT_OTHER)
Admission: RE | Disposition: A | Discharge status: Home / Self Care | Payer: Self-pay | Source: Ambulatory Visit | Attending: Urology

## 2021-07-08 ENCOUNTER — Encounter (HOSPITAL_BASED_OUTPATIENT_CLINIC_OR_DEPARTMENT_OTHER): Payer: Self-pay | Admitting: Urology

## 2021-07-08 ENCOUNTER — Ambulatory Visit (HOSPITAL_BASED_OUTPATIENT_CLINIC_OR_DEPARTMENT_OTHER)
Admission: RE | Admit: 2021-07-08 | Discharge: 2021-07-08 | Disposition: A | Discharge status: Home / Self Care | Payer: BC Managed Care – PPO | Source: Ambulatory Visit | Attending: Urology | Admitting: Urology

## 2021-07-08 ENCOUNTER — Ambulatory Visit (HOSPITAL_BASED_OUTPATIENT_CLINIC_OR_DEPARTMENT_OTHER): Payer: BC Managed Care – PPO | Admitting: Anesthesiology

## 2021-07-08 DIAGNOSIS — Z79899 Other long term (current) drug therapy: Secondary | ICD-10-CM | POA: Diagnosis not present

## 2021-07-08 DIAGNOSIS — N3080 Other cystitis without hematuria: Secondary | ICD-10-CM | POA: Insufficient documentation

## 2021-07-08 DIAGNOSIS — Z96651 Presence of right artificial knee joint: Secondary | ICD-10-CM | POA: Diagnosis not present

## 2021-07-08 DIAGNOSIS — Z9049 Acquired absence of other specified parts of digestive tract: Secondary | ICD-10-CM | POA: Insufficient documentation

## 2021-07-08 DIAGNOSIS — G4730 Sleep apnea, unspecified: Secondary | ICD-10-CM | POA: Insufficient documentation

## 2021-07-08 DIAGNOSIS — I714 Abdominal aortic aneurysm, without rupture, unspecified: Secondary | ICD-10-CM | POA: Insufficient documentation

## 2021-07-08 DIAGNOSIS — Z87891 Personal history of nicotine dependence: Secondary | ICD-10-CM | POA: Diagnosis not present

## 2021-07-08 DIAGNOSIS — N3289 Other specified disorders of bladder: Secondary | ICD-10-CM | POA: Diagnosis present

## 2021-07-08 DIAGNOSIS — I10 Essential (primary) hypertension: Secondary | ICD-10-CM | POA: Diagnosis not present

## 2021-07-08 HISTORY — PX: TRANSURETHRAL RESECTION OF BLADDER TUMOR: SHX2575

## 2021-07-08 HISTORY — PX: CYSTOSCOPY WITH STENT PLACEMENT: SHX5790

## 2021-07-08 HISTORY — DX: Other complications of anesthesia, initial encounter: T88.59XA

## 2021-07-08 HISTORY — DX: Bladder disorder, unspecified: N32.9

## 2021-07-08 HISTORY — DX: Diverticulitis of intestine, part unspecified, without perforation or abscess without bleeding: K57.92

## 2021-07-08 HISTORY — DX: Hyperlipidemia, unspecified: E78.5

## 2021-07-08 LAB — POCT I-STAT, CHEM 8
BUN: 8 mg/dL (ref 6–20)
Calcium, Ion: 1.1 mmol/L — ABNORMAL LOW (ref 1.15–1.40)
Chloride: 106 mmol/L (ref 98–111)
Creatinine, Ser: 1 mg/dL (ref 0.61–1.24)
Glucose, Bld: 108 mg/dL — ABNORMAL HIGH (ref 70–99)
HCT: 47 % (ref 39.0–52.0)
Hemoglobin: 16 g/dL (ref 13.0–17.0)
Potassium: 4.1 mmol/L (ref 3.5–5.1)
Sodium: 139 mmol/L (ref 135–145)
TCO2: 22 mmol/L (ref 22–32)

## 2021-07-08 SURGERY — TURBT (TRANSURETHRAL RESECTION OF BLADDER TUMOR)
Anesthesia: General | Site: Renal

## 2021-07-08 MED ORDER — PHENAZOPYRIDINE HCL 200 MG PO TABS: 200.0000 mg | ORAL_TABLET | Freq: Three times a day (TID) | ORAL | 0 refills | Status: DC | PRN

## 2021-07-08 MED ORDER — PROPOFOL 10 MG/ML IV BOLUS
INTRAVENOUS | Status: DC | PRN
  Administered 2021-07-08: 200 mg via INTRAVENOUS

## 2021-07-08 MED ORDER — PROPOFOL 10 MG/ML IV BOLUS
INTRAVENOUS | Status: AC
  Filled 2021-07-08: qty 20

## 2021-07-08 MED ORDER — KETOROLAC TROMETHAMINE 10 MG PO TABS: 10.0000 mg | ORAL_TABLET | Freq: Four times a day (QID) | ORAL | 0 refills | Status: DC | PRN

## 2021-07-08 MED ORDER — TRAMADOL HCL 50 MG PO TABS
50.0000 mg | ORAL_TABLET | Freq: Once | ORAL | Status: AC
  Administered 2021-07-08: 50 mg via ORAL

## 2021-07-08 MED ORDER — OXYBUTYNIN CHLORIDE 5 MG PO TABS
5.0000 mg | ORAL_TABLET | Freq: Three times a day (TID) | ORAL | 1 refills | Status: DC | PRN
Start: 2021-07-08 — End: 2021-11-24

## 2021-07-08 MED ORDER — CEFAZOLIN SODIUM-DEXTROSE 2-4 GM/100ML-% IV SOLN
2.0000 g | Freq: Once | INTRAVENOUS | Status: AC
  Administered 2021-07-08: 2 g via INTRAVENOUS

## 2021-07-08 MED ORDER — FENTANYL CITRATE (PF) 100 MCG/2ML IJ SOLN
INTRAMUSCULAR | Status: AC
  Filled 2021-07-08: qty 2

## 2021-07-08 MED ORDER — ACETAMINOPHEN 500 MG PO TABS
1000.0000 mg | ORAL_TABLET | Freq: Once | ORAL | Status: AC
  Administered 2021-07-08: 1000 mg via ORAL

## 2021-07-08 MED ORDER — SODIUM CHLORIDE 0.9 % IR SOLN
Status: DC | PRN
Start: 2021-07-08 — End: 2021-07-08
  Administered 2021-07-08 (×2): 3000 mL

## 2021-07-08 MED ORDER — MIDAZOLAM HCL 5 MG/5ML IJ SOLN
INTRAMUSCULAR | Status: DC | PRN
  Administered 2021-07-08: 2 mg via INTRAVENOUS

## 2021-07-08 MED ORDER — DEXAMETHASONE SODIUM PHOSPHATE 4 MG/ML IJ SOLN
INTRAMUSCULAR | Status: DC | PRN
  Administered 2021-07-08: 10 mg via INTRAVENOUS

## 2021-07-08 MED ORDER — ACETAMINOPHEN 500 MG PO TABS
ORAL_TABLET | ORAL | Status: AC
  Filled 2021-07-08: qty 2

## 2021-07-08 MED ORDER — 0.9 % SODIUM CHLORIDE (POUR BTL) OPTIME
TOPICAL | Status: DC | PRN
  Administered 2021-07-08: 500 mL

## 2021-07-08 MED ORDER — PROMETHAZINE HCL 25 MG/ML IJ SOLN: 6.2500 mg | INTRAMUSCULAR | Status: DC | PRN

## 2021-07-08 MED ORDER — ONDANSETRON HCL 4 MG/2ML IJ SOLN
INTRAMUSCULAR | Status: AC
  Filled 2021-07-08: qty 2

## 2021-07-08 MED ORDER — LACTATED RINGERS IV SOLN: INTRAVENOUS | Status: DC

## 2021-07-08 MED ORDER — TRAMADOL HCL 50 MG PO TABS
ORAL_TABLET | ORAL | Status: AC
  Filled 2021-07-08: qty 1

## 2021-07-08 MED ORDER — FENTANYL CITRATE (PF) 100 MCG/2ML IJ SOLN
25.0000 ug | INTRAMUSCULAR | Status: DC | PRN
  Administered 2021-07-08: 50 ug via INTRAVENOUS

## 2021-07-08 MED ORDER — LIDOCAINE HCL (CARDIAC) PF 100 MG/5ML IV SOSY
PREFILLED_SYRINGE | INTRAVENOUS | Status: DC | PRN
  Administered 2021-07-08: 100 mg via INTRAVENOUS

## 2021-07-08 MED ORDER — AMISULPRIDE (ANTIEMETIC) 5 MG/2ML IV SOLN: 10.0000 mg | Freq: Once | INTRAVENOUS | Status: DC | PRN

## 2021-07-08 MED ORDER — MIDAZOLAM HCL 2 MG/2ML IJ SOLN
INTRAMUSCULAR | Status: AC
  Filled 2021-07-08: qty 2

## 2021-07-08 MED ORDER — LIDOCAINE 2% (20 MG/ML) 5 ML SYRINGE
INTRAMUSCULAR | Status: AC
  Filled 2021-07-08: qty 5

## 2021-07-08 MED ORDER — ONDANSETRON HCL 4 MG/2ML IJ SOLN
INTRAMUSCULAR | Status: DC | PRN
  Administered 2021-07-08: 4 mg via INTRAVENOUS

## 2021-07-08 MED ORDER — DEXAMETHASONE SODIUM PHOSPHATE 10 MG/ML IJ SOLN
INTRAMUSCULAR | Status: AC
  Filled 2021-07-08: qty 1

## 2021-07-08 MED ORDER — CEFAZOLIN SODIUM-DEXTROSE 2-4 GM/100ML-% IV SOLN
INTRAVENOUS | Status: AC
  Filled 2021-07-08: qty 100

## 2021-07-08 MED ORDER — FENTANYL CITRATE (PF) 100 MCG/2ML IJ SOLN
INTRAMUSCULAR | Status: DC | PRN
  Administered 2021-07-08 (×2): 50 ug via INTRAVENOUS

## 2021-07-08 MED ORDER — PHENYLEPHRINE HCL (PRESSORS) 10 MG/ML IV SOLN
INTRAVENOUS | Status: DC | PRN
  Administered 2021-07-08: 80 ug via INTRAVENOUS
  Administered 2021-07-08: 120 ug via INTRAVENOUS
  Administered 2021-07-08: 80 ug via INTRAVENOUS
  Administered 2021-07-08: 120 ug via INTRAVENOUS

## 2021-07-08 SURGICAL SUPPLY — 29 items
APL SKNCLS STERI-STRIP NONHPOA (GAUZE/BANDAGES/DRESSINGS) ×2
BAG DRAIN URO-CYSTO SKYTR STRL (DRAIN) ×3 IMPLANT
BAG DRN RND TRDRP ANRFLXCHMBR (UROLOGICAL SUPPLIES)
BAG DRN UROCATH (DRAIN) ×2
BAG URINE DRAIN 2000ML AR STRL (UROLOGICAL SUPPLIES) IMPLANT
BAG URINE LEG 500ML (DRAIN) IMPLANT
BENZOIN TINCTURE PRP APPL 2/3 (GAUZE/BANDAGES/DRESSINGS) ×1 IMPLANT
DRSG TEGADERM 2-3/8X2-3/4 SM (GAUZE/BANDAGES/DRESSINGS) ×1 IMPLANT
GLOVE SURG ENC MOIS LTX SZ7.5 (GLOVE) ×3 IMPLANT
GLOVE SURG POLYISO LF SZ7 (GLOVE) ×1 IMPLANT
GLOVE SURG UNDER POLY LF SZ7 (GLOVE) ×1 IMPLANT
GOWN STRL REUS W/TWL LRG LVL3 (GOWN DISPOSABLE) ×1 IMPLANT
GOWN STRL REUS W/TWL XL LVL3 (GOWN DISPOSABLE) ×3 IMPLANT
GUIDEWIRE ZIPWRE .038 STRAIGHT (WIRE) ×1 IMPLANT
HOLDER FOLEY CATH W/STRAP (MISCELLANEOUS) IMPLANT
IV NS IRRIG 3000ML ARTHROMATIC (IV SOLUTION) ×4 IMPLANT
KIT TURNOVER CYSTO (KITS) ×3 IMPLANT
LOOP CUT BIPOLAR 24F LRG (ELECTROSURGICAL) IMPLANT
MANIFOLD NEPTUNE II (INSTRUMENTS) ×3 IMPLANT
NS IRRIG 500ML POUR BTL (IV SOLUTION) ×1 IMPLANT
PACK CYSTO (CUSTOM PROCEDURE TRAY) ×3 IMPLANT
STENT POLARIS 6X26 (STENTS) ×1 IMPLANT
STRIP CLOSURE SKIN 1/2X4 (GAUZE/BANDAGES/DRESSINGS) ×1 IMPLANT
SYR TOOMEY IRRIG 70ML (MISCELLANEOUS) ×3
SYRINGE TOOMEY IRRIG 70ML (MISCELLANEOUS) IMPLANT
TUBE CONNECTING 12X1/4 (SUCTIONS) ×3 IMPLANT
TUBING UROLOGY SET (TUBING) ×2 IMPLANT
WATER STERILE IRR 3000ML UROMA (IV SOLUTION) IMPLANT
WATER STERILE IRR 500ML POUR (IV SOLUTION) IMPLANT

## 2021-07-08 NOTE — Anesthesia Postprocedure Evaluation (Signed)
Anesthesia Post Note  Patient: Matthew Hoffman  Procedure(s) Performed: TRANSURETHRAL RESECTION OF BLADDER  LESION (Bladder) CYSTOSCOPY WITH STENT PLACEMENT (Left: Renal)     Patient location during evaluation: PACU Anesthesia Type: General Level of consciousness: awake Pain management: pain level controlled Vital Signs Assessment: post-procedure vital signs reviewed and stable Respiratory status: spontaneous breathing and respiratory function stable Cardiovascular status: stable Postop Assessment: no apparent nausea or vomiting Anesthetic complications: no   No notable events documented.  Last Vitals:  Vitals:   07/08/21 0945 07/08/21 1000  BP: 102/86 (!) 129/97  Pulse: (!) 54 (!) 55  Resp: 12 12  Temp:    SpO2: 95% 97%    Last Pain:  Vitals:   07/08/21 1000  TempSrc:   PainSc: 3                  Candra R Zubair Lofton

## 2021-07-08 NOTE — Op Note (Signed)
Operative Note  Preoperative diagnosis:  1.  Bladder neck lesions  Postoperative diagnosis: 1.  Bladder trigone lesions   Procedure(s): 1.  Cystoscopy with transurethral resection of bladder lesions measuring approximately 5 mm in greatest dimension 2.  Left ureteral stent placement  Surgeon: Ellison Hughs, MD  Assistants:  None  Anesthesia:  General  Complications:  None  EBL: Less than 5 mL  Specimens: 1.  Bladder trigone lesions  Drains/Catheters: 1.  Left 6 French, 26 cm Polaris stent with tether  Intraoperative findings:   Bulbous mucosa involving the bladder trigone and adjacent to both ureteral orifices.  The lesion adjacent to the left ureteral orifice was in very close proximity to the UO and a ureteral stent was placed to prevent occlusion of the left UO.  The lesion adjacent to the right ureteral orifice was well away from the resection and did not necessitate ureteral stent placement.  No other concerning intravesical lesions were identified.  Indication:  Matthew Hoffman is a 56 y.o. male with a history of microscopic hematuria as well as abnormal appearing mucosa adjacent to the bladder neck and trigone.  During a surveillance cystoscopy, the patient is found to have persistent mucosal abnormalities and is here today for a biopsy for further investigation.  He has been consented for the above procedures, voices understanding and wishes to proceed.  Description of procedure:  After informed consent was obtained, the patient was brought to the operating room and general LMA anesthesia was administered. The patient was then placed in the dorsolithotomy position and prepped and draped in the usual sterile fashion. A timeout was performed. A 23 French rigid cystoscope was then inserted into the urethral meatus and advanced into the bladder under direct vision. A complete bladder survey revealed bulbous mucosa involving the bladder trigone and extending within close  recommended to both ureteral orifices.    The 23 French rigid cystoscope was then exchanged for a 26 [place] resectoscope with a bipolar loop working element.  The bladder lesions were then resected and the specimens were sent to pathology for permanent section.  The area of resection was fulgurated until hemostasis was achieved.  The lesion that was adjacent to the left ureteral orifice was in very close proximity to the left UO and I felt it necessary to leave a ureteral stent to prevent occlusion of the UO.    A Glidewire was then advanced up the left ureteral orifice and up to the left renal pelvis, fluoroscopic guidance.  A 6 French, 26 cm Polaris stent was then advanced over the wire and into good position within the left collecting system, confirming placement via fluoroscopy.  The patient's bladder was drained.  He tolerated the procedure well and was transferred to the postanesthesia in stable condition  Plan: The patient has been instructed to remove his stent at 7 AM on 07/10/2021.  Follow-up in 2 weeks to discuss pathology results

## 2021-07-08 NOTE — Transfer of Care (Signed)
Immediate Anesthesia Transfer of Care Note  Patient: Matthew Hoffman  Procedure(s) Performed: TRANSURETHRAL RESECTION OF BLADDER  LESION (Bladder) CYSTOSCOPY WITH STENT PLACEMENT (Left: Renal)  Patient Location: PACU  Anesthesia Type:General  Level of Consciousness: awake, alert  and oriented  Airway & Oxygen Therapy: Patient Spontanous Breathing and Patient connected to nasal cannula oxygen  Post-op Assessment: Report given to RN and Post -op Vital signs reviewed and stable  Post vital signs: Reviewed and stable  Last Vitals:  Vitals Value Taken Time  BP    Temp    Pulse 57 07/08/21 0925  Resp 7 07/08/21 0925  SpO2 99 % 07/08/21 0925  Vitals shown include unvalidated device data.  Last Pain:  Vitals:   07/08/21 0706  TempSrc: Oral  PainSc: 0-No pain      Patients Stated Pain Goal: 4 ([id number])  Complications: No notable events documented.

## 2021-07-08 NOTE — Anesthesia Procedure Notes (Signed)
Procedure Name: LMA Insertion Date/Time: 07/08/2021 8:39 AM Performed by: Cleda Clarks, CRNA Pre-anesthesia Checklist: Patient identified, Emergency Drugs available, Suction available and Patient being monitored Patient Re-evaluated:Patient Re-evaluated prior to induction Oxygen Delivery Method: Circle system utilized Preoxygenation: Pre-oxygenation with 100% oxygen Induction Type: IV induction Ventilation: Mask ventilation without difficulty LMA: LMA inserted LMA Size: 5.0 Number of attempts: 1 Placement Confirmation: positive ETCO2 Tube secured with: Tape Dental Injury: Teeth and Oropharynx as per pre-operative assessment

## 2021-07-08 NOTE — Discharge Instructions (Addendum)
°  Post Anesthesia Home Care Instructions  Activity: Get plenty of rest for the remainder of the day. A responsible individual must stay with you for 24 hours following the procedure.  For the next 24 hours, DO NOT: -Drive a car -Advertising copywriter -Drink alcoholic beverages -Take any medication unless instructed by your physician -Make any legal decisions or sign important papers.  Meals: Start with liquid foods such as gelatin or soup. Progress to regular foods as tolerated. Avoid greasy, spicy, heavy foods. If nausea and/or vomiting occur, drink only clear liquids until the nausea and/or vomiting subsides. Call your physician if vomiting continues.  Special Instructions/Symptoms: Your throat may feel dry or sore from the anesthesia or the breathing tube placed in your throat during surgery. If this causes discomfort, gargle with warm salt water. The discomfort should disappear within 24 hours.    May take Tylenol as needed for pain beginning at 1 PM today.

## 2021-07-08 NOTE — H&P (Signed)
PRE-OP H&P  Office Visit Report     06/19/2021   --------------------------------------------------------------------------------   Matthew Hoffman  MRN: [id number]  DOB: 23-Jan-1966, 56 year old Male   PRIMARY CARE:  Jilda Panda, MD  REFERRING:  Jilda Panda, MD  PROVIDER:  Ellison Hughs, M.D.  LOCATION:  [facility] [phone]     --------------------------------------------------------------------------------   CC/HPI: CC: Bladder defect   HPI: Mr. Curiale is a 56 year old male with a history of gross hematuria, a bladder mucosal lesion with underlying artery and ED. Hx of open AAA repair in Oct 22, 2017 and partial colectomy in October 22, 2000 with subsequent hernia repair. Possible traumatic Foley placement during his AAA repair and recovery.   06/19/20: The patient was recently seen in the ED for abdominal pain and was found to have a filling defect on CT. The patient reports a good force of stream and feels like he is emptying his bladder well. He has no urgency or frequency. Nocturia x0-1. He denies any recent episodes of dysuria, hematuria or UTIs.   06/19/21: The patient is here today for a routine follow-up and cystoscopy to reassess the mucosal bladder lesion noted last year. He is recovering from recent right knee replacement. Otherwise, no new health issues. He is voiding w/o difficulty and denies interval UTIs, dysuria or hematuria.     ALLERGIES: Shellfish    MEDICATIONS: Diltiazem 24Hr Er (Cd) 180 mg capsule, ext release 24 hr  Valsartan-Hydrochlorothiazide     GU PSH: Cystoscopy - 06/19/2020 Locm 300-399Mg /Ml Iodine,1Ml - 10/22/2017 Vasectomy - 2015/10/23       PSH Notes: Elbow Surgery, Arthroscopy Knee Left, Gastric Surgery, Shoulder Surgery   NON-GU PSH: Inguinal Hernia Repair > 5 yrs - 10-23-15 Knee Arthroscopy; Dx 2015/10/23     GU PMH: Bladder disorder, Unspec - 06/19/2020 ED (drug-induced) 10-23-2019, 10-23-18 Gross hematuria - 23-Oct-2019, 23-Oct-2018, Oct 22, 2017, 10/22/2017 (Acute), 10/22/2017 Encounter for Prostate Cancer screening (Chronic) - October 22, 2017 Encounter for sterilization - 10/23/15 Nocturia, Nocturia - 10/23/15 BPH w/o LUTS, Benign prostatic hypertrophy without lower urinary tract symptoms (LUTS) - 10-23-15      PMH Notes:  2008-11-08 16:02:59 - Note: Apnea   NON-GU PMH: Encounter for general adult medical examination without abnormal findings, Encounter for preventive health examination - 23-Oct-2015 Personal history of other diseases of the musculoskeletal system and connective tissue, History of arthritis - 10/23/15 Personal history of other diseases of the nervous system and sense organs, History of sleep apnea - 10/23/2015 Personal history of other diseases of the circulatory system, History of hypertension - October 22, 2012 Sebaceous cyst, Sebaceous cyst - 2012-10-22    FAMILY HISTORY: Death - Father No pertinent family history - Runs In Family   SOCIAL HISTORY: Marital Status: Married Preferred Language: English; Ethnicity: Not Hispanic Or Latino; Race: Black or African American Current Smoking Status: Patient has never smoked.  Social Drinker.  Drinks 2 caffeinated drinks per day. 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     Notes: Caffeine Use, Current smoker on some days, Divorced, Alcohol Use   REVIEW OF SYSTEMS:    GU Review Male:   Patient denies frequent urination, hard to postpone urination, burning/ pain with urination, get up at night to urinate, leakage of urine, stream starts and stops, trouble starting your stream, have to strain to urinate , erection problems, and penile pain.  Gastrointestinal (Upper):   Patient denies nausea, vomiting, and indigestion/ heartburn.  Gastrointestinal (Lower):   Patient denies diarrhea and  constipation.  Constitutional:   Patient denies fever, night sweats, weight loss, and fatigue.  Skin:   Patient denies skin rash/ lesion and itching.  Eyes:   Patient denies blurred vision and double vision.  Ears/ Nose/ Throat:   Patient denies sore  throat and sinus problems.  Hematologic/Lymphatic:   Patient denies swollen glands and easy bruising.  Cardiovascular:   Patient denies leg swelling and chest pains.  Respiratory:   Patient denies cough and shortness of breath.  Endocrine:   Patient denies excessive thirst.  Musculoskeletal:   Patient denies back pain and joint pain.  Neurological:   Patient denies headaches and dizziness.  Psychologic:   Patient denies depression and anxiety.   VITAL SIGNS:      06/19/2021 11:08 AM  Weight 292 lb / 132.45 kg  Height 74 in / 187.96 cm  BP 150/91 mmHg  Pulse 78 /min  Temperature 97.1 F / 36.1 C  BMI 37.5 kg/m   GU PHYSICAL EXAMINATION:    Urethral Meatus: Normal size. No lesion, no wart, no discharge, no polyp. Normal location.  Penis: Circumcised, no warts, no cracks. No dorsal Peyronie's plaques, no left corporal Peyronie's plaques, no right corporal Peyronie's plaques, no scarring, no warts. No balanitis, no meatal stenosis.   MULTI-SYSTEM PHYSICAL EXAMINATION:    Constitutional: Well-nourished. No physical deformities. Normally developed. Good grooming.  Neurologic / Psychiatric: Oriented to time, oriented to place, oriented to person. No depression, no anxiety, no agitation.     Complexity of Data:  Records Review:   Previous Patient Records   11/01/19 07/15/17 10/09/15  PSA  Total PSA 0.91 ng/mL 0.83 ng/mL 1.00     PROCEDURES:         Flexible Cystoscopy - 52000  Risks, benefits, and some of the potential complications of the procedure were discussed at length with the patient including infection, bleeding, voiding discomfort, urinary retention, fever, chills, sepsis, and others. All questions were answered. Informed consent was obtained. Antibiotic prophylaxis was given. Sterile technique and intraurethral analgesia were used.  Meatus:  Normal size. Normal location. Normal condition.  Urethra:  No strictures.  External Sphincter:  Normal.  Verumontanum:  Normal.   Prostate:  Non-obstructing. No hyperplasia. Erractic architecture of prostatic urethra  Bladder Neck:  Non-obstructing.  Ureteral Orifices:  Normal location. Normal size. Normal shape. Effluxed clear urine.  Bladder:  No trabeculation. No tumors. Normal mucosa. No stones. Persistent and stable mucosal mound adjacent to the left UO and bladder neck.       The lower urinary tract was carefully examined. The procedure was well-tolerated and without complications. Antibiotic instructions were given. Instructions were given to call the office immediately for bloody urine, difficulty urinating, urinary retention, painful or frequent urination, fever, chills, nausea, vomiting or other illness. The patient stated that he understood these instructions and would comply with them.         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 He was also found to have a widely patent prostatic urethral with erratic lobe architecture.  -The risks, benefits and alternatives of cystoscopy with TURBT was discussed with the patient. The risks include, but are not limited to, bleeding, urinary tract infection, bladder perforation requiring prolonged catheterization and/or ureteral obstruction, voiding dysfunction and the inherent risks of general anesthesia. The patient  voices understanding and wishes to proceed.

## 2021-07-09 ENCOUNTER — Encounter (HOSPITAL_BASED_OUTPATIENT_CLINIC_OR_DEPARTMENT_OTHER): Payer: Self-pay | Admitting: Urology

## 2021-07-09 LAB — SURGICAL PATHOLOGY

## 2021-11-18 ENCOUNTER — Telehealth: Payer: Self-pay

## 2021-11-18 NOTE — Telephone Encounter (Signed)
   Pre-operative Risk Assessment    Patient Name: Matthew Hoffman  DOB: 09-05-1965 MRN: [id number]      Request for Surgical Clearance    Procedure:  Colonoscopy   Date of Surgery:  Clearance 11/26/21                                 Surgeon:  Dr. Kyla Balzarine Surgeon's Group or Practice Name:  [facility]  Phone number:  [phone] Fax number:  [phone]   Type of Clearance Requested:   - Medical    Type of Anesthesia:  Not Indicated   Additional requests/questions:    SignedDorris Fetch   11/18/2021, 4:40 PM

## 2021-11-19 NOTE — Telephone Encounter (Signed)
I s/w the pt and he is agreeable to in office appt. I offered a time for today but pt could not make it. Pt tells me that he is taking care of his sister who has cancer and lives in [place]. I then offered 11/24/21 at [facility] either 1:55 Tereso Newcomer, PAC or 2:45 Ronie Spies, PAC. Pt tells me that he knows he would not be able to make the 1:55 and opted for the 2:45 appt.   I will send notes to [facility] for upcoming appt. I s/w the requesting office a few minutes earlier when their office called. I informed them that the pt will need an appt in the office as he was last seen 09/2020. Assured that I will send FYI to requesting office the pt has appt.  I asked the pt if he thought he might push out his procedure a little as he tells me that he has a lot on his plate right now. Pt says no he cannot move his procedure out as he has a bleed, which is the reason for the colonoscopy.

## 2021-11-19 NOTE — Telephone Encounter (Signed)
Primary Cardiologist:Brian Jens Som, MD  Chart reviewed as part of pre-operative protocol coverage. Because of Matthew Hoffman past medical history and time since last visit, he/she will require a follow-up visit in order to better assess preoperative cardiovascular risk.  Pre-op covering staff: - Please schedule appointment and call patient to inform them. - Please contact requesting surgeon's office via preferred method (i.e, phone, fax) to inform them of need for appointment prior to surgery.  If applicable, this message will also be routed to pharmacy pool and/or primary cardiologist for input on holding anticoagulant/antiplatelet agent as requested below so that this information is available at time of patient's appointment.   Ronney Asters, NP  11/19/2021, 9:22 AM

## 2021-11-23 ENCOUNTER — Other Ambulatory Visit: Payer: Self-pay | Admitting: Cardiology

## 2021-11-23 ENCOUNTER — Encounter: Payer: Self-pay | Admitting: Physician Assistant

## 2021-11-23 NOTE — Progress Notes (Unsigned)
Cardiology Office Note    Date:  11/24/2021   ID:  [name], DOB 1966-05-31, MRN [id number]  PCP:  Ralene Ok, MD  Cardiologist:  Olga Millers, MD  Electrophysiologist:  None   Chief Complaint: f/u aneurysm repair, clearance for colonoscopy  History of Present Illness:   Matthew Hoffman is a 56 y.o. 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  Matthew Hoffman originally had his aneurysm discovered on CTA in 2019 as part of evaluation for hematuria showing 7.3cm sinus of valsalva aneurysm.  Preop echo showed EF 55-60%, moderate focal basal hypertrophy of septum and mild concentric LVH, G2DD, severe aneurysmal dilation at level of sinus of valsalva, moderate LAE, mild AI. Cardiac cath showed that the prox RCA was aneurysmal but otherwise no significant CAD. Preoperative carotid Dopplers showed no significant stenosis. Patient had repair of sinus of Valsalva aneurysm using a graft with resuspension of aortic valve 08/2017.  Postoperative course was complicated by atrial flutter and patient had TEE guided cardioversion. This TEE showed no significant AI. Anticoagulation was later able to be stopped due to no recurrence. Patient had Matthew Hoffman of his upper portion of the sternal wound and was treated with Keflex with improvement. He was seen with chest pain April 2020.  CTA showed no acute abnormalities with surgical changes of previous aneurysm repair. He was last seen 09/2020 and was doing well, was no longer on ASA at that visit.  We received a clearance request for colonoscopy later this week and therefore visit was recommended for overdue follow-up. He is seen for follow-up today feeling well without any  complaints. No CP, SOB, palpitations, dizziness or syncope. BP is running slightly above goal but he reports some dietary indiscretion over the weekend. He has gained weight since last visit but is working with a coach to monitor his meal intake and track his steps. He is hoping to lose about 20lb.   Labwork independently reviewed: 06/2021 K 4.1, Cr 1.0 05/2020 Hgb 15.9, plt 238, K 3.8, Cr 1.15, LFTs ok   Cardiology Studies:   Studies reviewed are outlined and summarized above. Reports included below if pertinent.   TEE DCCV 08/2017 Study Conclusions   - Left ventricle: Systolic function was normal. The estimated    ejection fraction was in the range of 55% to 60%.  - Aortic valve: Recent biological valved conduit with no    significant AR and normal leaflet motion. Normal aortic    root size 3.0 cm  - Left atrium: No evidence of thrombus in the atrial cavity or    appendage.  - Right atrium: No evidence of thrombus in the atrial cavity or    appendage.  - Atrial septum: No defect or patent foramen ovale was identified.    Echo contrast study showed no right-to-left atrial level shunt,    following an increase in RA pressure induced by provocative    maneuvers.  - Impressions: No LAA thrombus    Proceeded with [facility] x 1    Patient converted to NSR.   Impressions:   - No LAA thrombus    Proceeded with [facility] x 1    Patient converted to NSR.   Carotids 2019 Final Interpretation:  Right Carotid: The extracranial vessels were near-normal with  only minimal  wall                 thickening or plaque.   Left Carotid: The extracranial vessels were near-normal with only minimal  wall                thickening or plaque.   Bilateral Upper Extremity: Radial Doppler waveform obliterate with  compression. Ulnar Doppler waveforms remain within normal limits with  compression.      Cath 07/2017 1. Technically difficult cardiac catheterization given the severe enlargement of the aortic  root.  2. No angiographic evidence of CAD. 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  3. Normal right and left heart pressures.    Recommendations: Continue planning for aortic root replacement.   Echo 07/2017 Study Conclusions   - Left ventricle: The cavity size was normal. There was moderate    focal basal hypertrophy of the septum with otherwise mild    concentric hypertrophy. Systolic function was normal. The    estimated ejection fraction was in the range of 55% to 60%. Wall    motion was normal; there were no regional wall motion    abnormalities. Features are consistent with a pseudonormal left    ventricular filling pattern, with concomitant abnormal relaxation    and increased filling pressure (grade 2 diastolic dysfunction).  - Aortic valve: Severe aneurysmal dilation at the level of the    sinus of Valsalva worse in the left cornary cusp. Transvalvular    velocity was within the normal range. There was no stenosis.    There was mild regurgitation.  - Aorta: Ascending aortic diameter: 43.91 mm (S).  - Ascending aorta: The ascending aorta was moderately dilated.  - Mitral valve: Elongation of the anterior leaflet. Transvalvular    velocity was within the normal range. There was no evidence for    stenosis. There was no regurgitation.  - Left atrium: The atrium was moderately dilated.  - Right ventricle: The cavity size was normal. Wall thickness was    normal. Systolic function was normal.  - Atrial septum: No defect or patent foramen ovale was identified.  - Tricuspid valve: There was trivial regurgitation.  - Pulmonary arteries: Systolic pressure was within the normal    range. PA peak pressure: 17 mm Hg (S).    Past Medical History:  Diagnosis Date   Aortic insufficiency    Arthritis    Atrial flutter (HCC)    Complication of anesthesia    hx of a flutter postop 2019 aneurysm repair with no recurrance, no problems with 05-14-2021 left knee meniscus  repair   Diverticulitis    no flare ups in years no meds taken   Hiatal hernia    small per ct 2016   History of COVID-19 06/2020   asymptomatic   Hyperlipidemia    Hypertension    Hypertrophy of prostate    mild   Lesion of bladder    OSA on CPAP    per pt moderate osa per study   Sinus of Valsalva aneurysm    Thoracic aortic aneurysm (HCC)    Wears glasses     Past Surgical History:  Procedure Laterality Date   CARDIOVERSION N/A 09/15/2017   Procedure: CARDIOVERSION;  Surgeon: Wendall Stade, MD;  Location: [facility] ENDOSCOPY;  Service: Cardiovascular;  Laterality: N/A;   COLON RESECTION  2002   colonscopy  05/2018   CYSTOSCOPY WITH STENT PLACEMENT Left 07/08/2021   Procedure: CYSTOSCOPY WITH  STENT PLACEMENT;  Surgeon: Rene PaciWinter, Christopher Aaron, MD;  [facility] [place];  Service: Urology;  Laterality: Left;   ELBOW SURGERY Right 1999   KNEE ARTHROSCOPY Left 1995   left knee meniscus  repair  05/14/2021   RIGHT/LEFT HEART CATH AND CORONARY ANGIOGRAPHY N/A 08/24/2017   Procedure: RIGHT/LEFT HEART CATH AND CORONARY ANGIOGRAPHY;  Surgeon: Kathleene HazelMcAlhany, Christopher D, MD;  Location: [facility] INVASIVE CV LAB;  Service: Cardiovascular;  Laterality: N/A;   SHOULDER SURGERY Bilateral right 1992/  left 1987   TEE WITHOUT CARDIOVERSION N/A 09/08/2017   Procedure: TRANSESOPHAGEAL ECHOCARDIOGRAM (TEE);  Surgeon: Loreli SlotHendrickson, Steven C, MD;  [facility];  Service: Open Heart Surgery;  Laterality: N/A;   TEE WITHOUT CARDIOVERSION N/A 09/15/2017   Procedure: TRANSESOPHAGEAL ECHOCARDIOGRAM (TEE);  Surgeon: Wendall StadeNishan, Peter C, MD;  [facility];  Service: Cardiovascular;  Laterality: N/A;   THORACIC AORTIC ANEURYSM REPAIR N/A 09/08/2017   Procedure: REPAIR OF SINUS OF VALSALVA ANEURYSM;  Surgeon: Loreli SlotHendrickson, Steven C, MD;  [facility];  Service: Open Heart Surgery;  Laterality: N/A;  Using 30mm Valsalva Gelweave Graft   TRANSURETHRAL RESECTION OF BLADDER TUMOR N/A 07/08/2021    Procedure: TRANSURETHRAL RESECTION OF BLADDER  LESION;  Surgeon: Rene PaciWinter, Christopher Aaron, MD;  [facility] [place];  Service: Urology;  Laterality: N/A;   UMBILICAL HERNIA REPAIR  10/2015   VASECTOMY Bilateral 06/10/2016   Procedure: VASECTOMY;  Surgeon: Malen GauzePatrick L McKenzie, MD;  [facility] ;  Service: Urology;  Laterality: Bilateral;    Current Medications: Current Meds  Medication Sig   atorvastatin (LIPITOR) 20 MG tablet Take 20 mg by mouth daily.   CINNAMON PO Take by mouth daily.   diltiazem (CARDIZEM CD) 180 MG 24 hr capsule TAKE 1 CAPSULE BY MOUTH EVERY DAY   EPINEPHrine 0.3 mg/0.3 mL IJ SOAJ injection Inject 0.3 mg into the muscle as needed (for allergic reaction).    fluticasone (FLONASE) 50 MCG/ACT nasal spray Place 1 spray into both nostrils as needed for allergies.   latanoprost (XALATAN) 0.005 % ophthalmic solution 1 drop at bedtime.   Multiple Vitamin (MULTIVITAMIN) tablet Take 1 tablet by mouth daily. 50 plus   valsartan-hydrochlorothiazide (DIOVAN-HCT) 320-12.5 MG tablet TAKE 1 TABLET BY MOUTH DAILY      Allergies:   Shellfish allergy and Oxycodone   Social History   Socioeconomic History   Marital status: Divorced    Spouse name: Not on file   Number of children: 2   Years of education: Not on file   Highest education level: Not on file  Occupational History    Comment: Counseling  Tobacco Use   Smoking status: Former    Types: Cigars    Quit date: 06/28/2017    Years since quitting: 4.4   Smokeless tobacco: Never   Tobacco comments:    average cigar 2 per month  Vaping Use   Vaping Use: Never used  Substance and Sexual Activity   Alcohol use: Yes    Comment: OCCASIONAL   Drug use: No   Sexual activity: Not on file  Other Topics Concern   Not on file  Social History Narrative   Not on file   Social Determinants of Health   Financial Resource Strain: Not on file  Food Insecurity: Not on file   Transportation Needs: Not on file  Physical Activity: Not on file  Stress: Not on file  Social Connections: Not on file     Family History:  The patient's family  history includes COPD in his father; Hypertension in his mother.  ROS:   Please see the history of present illness.  All other systems are reviewed and otherwise negative.    EKG(s)/Additional Labs   EKG:  EKG is ordered today, personally reviewed, demonstrating NSR 73bpm, occasional PVCs, nonspecific TW changes.QTc  Recent Labs: 07/08/2021: BUN 8; Creatinine, Ser 1.00; Hemoglobin 16.0; Potassium 4.1; Sodium 139  Recent Lipid Panel No results found for: CHOL, TRIG, HDL, CHOLHDL, VLDL, LDLCALC, LDLDIRECT  PHYSICAL EXAM:    VS:  BP 137/88   Pulse 73   Ht 6' 2.5" (1.892 m)   Wt (!) 306 lb (138.8 kg)   SpO2 96%   BMI 38.76 kg/m   BMI: Body mass index is 38.76 kg/m.  GEN: Well nourished, well developed male in no acute distress HEENT: normocephalic, atraumatic Neck: no JVD, carotid bruits, or masses Cardiac: RRR; occasional ectopy, no murmurs, rubs, or gallops, no edema  Respiratory:  clear to auscultation bilaterally, normal work of breathing GI: soft, nontender, nondistended, + BS MS: no deformity or atrophy Skin: warm and dry, no rash Neuro:  Alert and Oriented x 3, Strength and sensation are intact, follows commands Psych: euthymic mood, full affect  Wt Readings from Last 3 Encounters:  11/24/21 (!) 306 lb (138.8 kg)  07/08/21 295 lb 3.2 oz (133.9 kg)  10/02/20 294 lb 12.8 oz (133.7 kg)     ASSESSMENT & PLAN:   1. Preop cardiovascular exam - he is seen for follow-up today in preparation for colonoscopy. He has been having some bleeding per notes. He denies any concerning cardiac symptoms. His EKG incidentally picked up occasional PVCs but he is asymptomatic with these and they were seen on remote tracing as well. We will undertake repeat labs and echo but I do not think these should keep him from  getting his colonoscopy this week. Wells Guiles would be at acceptable risk for the planned procedure without further cardiovascular testing. I will fax a copy of this note to requesting provider to let them know.  2. Thoracic aortic aneurysm s/p repair - doing well clinically. Reviewed aneurysm precautions with patient and outlined on paperwork (family screening, avoidance of fluoroquinolones, avoidance of heavy lifting). I have reached out to Dr. Jens Som to clarify the interval at which he'd suggest repeat CTA, if any, as well as whether his graft repair warrants SBE prophylaxis for dental procedures. This has not been previously recommended in the past. He has not been on aspirin since last visit per Dr. Jens Som. We need to ensure good BP control. See #4.  3. H/o aortic insufficiency - last assessed on TEE in 08/2017, no significant AI at that time. There was mild PR/TR. We are getting updated echo as above anyway, will be able to reassess at that time.  4. Essential HTN - Suboptimal blood pressure control noted today though patient thinks it may be related to diet over the weekend. He is actively working with a trainer/coach to better his eating and exercise habits. We need a better idea of what it's running at home. The patient was provided instructions on monitoring blood pressure at home for 1 week and relaying results to our office. Would consider addition of carvedilol if SBPs are frequently 130 or higher. Will otherwise continue present regimen of diltiazem and valsartan-HCTZ. Also encouraged he get back in with the provider that manages his OSA as he is not able to tolerate the mask more than about 3 hrs a  night.  5. Atrial flutter - continue diltiazem. Maintaining NSR. Not on oral anticoagulation as he had not had any recurrence.  6. PVCs - incidentally noted on EKG, asymptomatic. Will update labs on file with BMET, CBC, Mg, TSH. Update 2D echocardiogram. Consider carvedilol as addition to  his regimen based on his reported BP readings in 1 week.    Disposition: F/u with myself in 4-6 weeks to review echo and BP plan.   Medication Adjustments/Labs and Tests Ordered: Current medicines are reviewed at length with the patient today.  Concerns regarding medicines are outlined above. Medication changes, Labs and Tests ordered today are summarized above and listed in the Patient Instructions accessible in Encounters.   Signed, Laurann Montana, PA-C  11/24/2021 3:21 PM    [facility] Phone: [phone]; Fax: [phone]

## 2021-11-24 ENCOUNTER — Telehealth: Payer: Self-pay | Admitting: Physician Assistant

## 2021-11-24 ENCOUNTER — Encounter: Payer: Self-pay | Admitting: Physician Assistant

## 2021-11-24 ENCOUNTER — Ambulatory Visit (INDEPENDENT_AMBULATORY_CARE_PROVIDER_SITE_OTHER): Payer: BC Managed Care – PPO | Admitting: Physician Assistant

## 2021-11-24 VITALS — BP 137/88 | HR 73 | Ht 74.5 in | Wt 306.0 lb

## 2021-11-24 DIAGNOSIS — I712 Thoracic aortic aneurysm, without rupture, unspecified: Secondary | ICD-10-CM

## 2021-11-24 DIAGNOSIS — I351 Nonrheumatic aortic (valve) insufficiency: Secondary | ICD-10-CM | POA: Diagnosis not present

## 2021-11-24 DIAGNOSIS — I4892 Unspecified atrial flutter: Secondary | ICD-10-CM

## 2021-11-24 DIAGNOSIS — I10 Essential (primary) hypertension: Secondary | ICD-10-CM

## 2021-11-24 DIAGNOSIS — I493 Ventricular premature depolarization: Secondary | ICD-10-CM

## 2021-11-24 DIAGNOSIS — Z9889 Other specified postprocedural states: Secondary | ICD-10-CM | POA: Diagnosis not present

## 2021-11-24 DIAGNOSIS — Z01810 Encounter for preprocedural cardiovascular examination: Secondary | ICD-10-CM

## 2021-11-24 DIAGNOSIS — Z8679 Personal history of other diseases of the circulatory system: Secondary | ICD-10-CM

## 2021-11-24 NOTE — Patient Instructions (Signed)
Medication Instructions:  Your physician recommends that you continue on your current medications as directed. Please refer to the Current Medication list given to you today.  *If you need a refill on your cardiac medications before your next appointment, please call your pharmacy*   Lab Work: TODAY-BMET, MAG, CBC, TSH If you have labs (blood work) drawn today and your tests are completely normal, you will receive your results only by: MyChart Message (if you have MyChart) OR A paper copy in the mail If you have any lab test that is abnormal or we need to change your treatment, we will call you to review the results.   Testing/Procedures: Your physician has requested that you have an echocardiogram. Echocardiography is a painless test that uses sound waves to create images of your heart. It provides your doctor with information about the size and shape of your heart and how well your heart's chambers and valves are working. This procedure takes approximately one hour. There are no restrictions for this procedure.   Follow-Up: At [facility], you and your health needs are our priority.  As part of our continuing mission to provide you with exceptional heart care, we have created designated Provider Care Teams.  These Care Teams include your primary Cardiologist (physician) and Advanced Practice Providers (APPs -  Physician Assistants and Nurse Practitioners) who all work together to provide you with the care you need, when you need it.  We recommend signing up for the patient portal called "MyChart".  Sign up information is provided on this After Visit Summary.  MyChart is used to connect with patients for Virtual Visits (Telemedicine).  Patients are able to view lab/test results, encounter notes, upcoming appointments, etc.  Non-urgent messages can be sent to your provider as well.   To learn more about what you can do with MyChart, go to [email or web address].    Your next appointment:    6 week(s)  The format for your next appointment:   In Person  Provider:   Olga Millers, MD     Other Instructions Information About Your Aneurysm Repair  Mainstays of therapy for aneurysms include very good blood pressure control, healthy lifestyle, and avoiding tobacco products and street drugs. Research has raised concern that antibiotics in the fluoroquinolone class could be associated with increased risk of having an aneurysm develop or tear. This includes medicines that end in "floxacin," like Cipro or Levaquin. Make sure to discuss this information with other healthcare providers if you require antibiotics.  Since aneurysms can run in families, you should discuss your diagnosis with first degree relatives as they may need to be screened for this. Regular mild-moderate physical exercise is important, but avoid heavy lifting/weight lifting over 30lbs, chopping wood, shoveling snow or digging heavy earth with a shovel. It is best to avoid activities that cause grunting or straining (medically referred to as a "Valsalva maneuver"). This happens when a person bears down against a closed throat to increase the strength of arm or abdominal muscles. There's often a tendency to do this when lifting heavy weights, doing sit-ups, push-ups or chin-ups, etc., but it may be harmful.  This is a finding I would expect to be monitored periodically by your cardiology team. Most unruptured thoracic aortic aneurysms cause no symptoms, so they are often found during exams for other conditions. Contact a health care provider if you develop any discomfort in your upper back, neck, abdomen, trouble swallowing, cough or hoarseness, or unexplained weight loss. Get help right away  if you develop severe pain in your upper back or abdomen that may move into your chest and arms, or any other concerning symptoms such as shortness of breath or fever.  Blood Pressure  We need to get a better idea of what your blood  pressure is running at home. Here are some instructions to follow: - I would recommend using a blood pressure cuff that goes on your arm. The wrist ones can be inaccurate. If you're purchasing one for the first time, try to select one that also reports your heart rate because this can be helpful information as well. - To check your blood pressure, choose a time at least 3 hours after taking your blood pressure medicines. If you can sample it at different times of the day, that's great - it might give you more information about how your blood pressure fluctuates. Remain seated in a chair for 5 minutes quietly beforehand, then check it.  - Please record a list of those readings and call us/send in MyChart message with them for our review in 1 week.  Important Information About Sugar

## 2021-11-24 NOTE — Telephone Encounter (Signed)
   I reviewed today's visit with Dr. Jens Som along with the fact that he is no longer on ASA. Dr. Jens Som is OK with that.  Please let pt know I spoke with Dr. Jens Som after his visits to close the loop on the questions I had. Per discussion with Dr. Jens Som:  - would go ahead and repeat CT angio chest aorta to re-evaluate his surgical repair (does not necessarily need to be done prior to the colonoscopy- routine follow-up). This is in addition to the echo we have planned for him - Dr. Jens Som does favor the idea of antibiotic prophylaxis prior to dental procedures (this includes all cleanings) - please rx amoxicillin 500mg  take 4 tablets 30-60 minutes prior to dental work, disp #4 with 2 refills  Will also have another result note coming your way when labs result so OK to call in one phone call if desired!  Havanah Nelms PA-C

## 2021-11-25 ENCOUNTER — Ambulatory Visit (INDEPENDENT_AMBULATORY_CARE_PROVIDER_SITE_OTHER): Payer: BC Managed Care – PPO

## 2021-11-25 DIAGNOSIS — I493 Ventricular premature depolarization: Secondary | ICD-10-CM

## 2021-11-25 LAB — BASIC METABOLIC PANEL
BUN/Creatinine Ratio: 16 (ref 9–20)
BUN: 19 mg/dL (ref 6–24)
CO2: 23 mmol/L (ref 20–29)
Calcium: 9.7 mg/dL (ref 8.7–10.2)
Chloride: 102 mmol/L (ref 96–106)
Creatinine, Ser: 1.22 mg/dL (ref 0.76–1.27)
Glucose: 90 mg/dL (ref 70–99)
Potassium: 4.3 mmol/L (ref 3.5–5.2)
Sodium: 141 mmol/L (ref 134–144)
eGFR: 71 mL/min/{1.73_m2} (ref >59)

## 2021-11-25 LAB — CBC
Hematocrit: 43.3 % (ref 37.5–51.0)
Hemoglobin: 14.6 g/dL (ref 13.0–17.7)
MCH: 27.1 pg (ref 26.6–33.0)
MCHC: 33.7 g/dL (ref 31.5–35.7)
MCV: 81 fL (ref 79–97)
Platelets: 245 10*3/uL (ref 150–450)
RBC: 5.38 x10E6/uL (ref 4.14–5.80)
RDW: 13.9 % (ref 11.6–15.4)
WBC: 4.9 10*3/uL (ref 3.4–10.8)

## 2021-11-25 LAB — TSH: TSH: 1.21 u[IU]/mL (ref 0.450–4.500)

## 2021-11-25 LAB — MAGNESIUM: Magnesium: 2.2 mg/dL (ref 1.6–2.3)

## 2021-11-25 MED ORDER — AMOXICILLIN 500 MG PO TABS: ORAL_TABLET | ORAL | 2 refills | Status: DC

## 2021-11-25 NOTE — Progress Notes (Unsigned)
Enrolled patient for a 3 day Zio XT monitor to be mailed to patients home  ? ?[facility] to read ?

## 2021-11-25 NOTE — Telephone Encounter (Signed)
Contacted the patient and gave him Dayna and Dr Stanford Breed recommendations.   Patient voiced understanding. Orders placed.

## 2021-11-26 ENCOUNTER — Telehealth: Payer: Self-pay | Admitting: Cardiology

## 2021-11-26 NOTE — Telephone Encounter (Signed)
Spoke with the patient who states he had his colonoscopy today and his doctor stated his PVCs were higher than normal. He states he has always had an abnormal heartrate, but he wanted to make sure this was nothing new. He thinks that he could have been a little worked up today and that why his PVCs were elevated.

## 2021-11-26 NOTE — Telephone Encounter (Signed)
He had PVCs on EKG back in 2019 as well so they appear to come and go for him. The EKG at recent visit also picked them up. We see these even in normal healthy hearts so we just to be sure that the percentage of PVCs is not abnormal and that his heart remains strong with normal heart pump function. The heart monitor and echo will help [place] with this. We can try starting him on Toprol 25mg  daily to see if we can help suppress some of these beats. I would do a short term rx to start - he was going to get back with [place] about his blood pressures in 1 week and if they are high we might consider going up on the dose or changing to carvedilol. Thanks!

## 2021-11-26 NOTE — Telephone Encounter (Signed)
error 

## 2021-11-26 NOTE — Telephone Encounter (Signed)
Pt states he would like a call back to discuss this further along with the PVC diagnosis. Says he has questions about what was told to him and would like to talk again.

## 2021-11-27 MED ORDER — METOPROLOL SUCCINATE ER 25 MG PO TB24: 25.0000 mg | ORAL_TABLET | Freq: Every day | ORAL | 2 refills | Status: DC

## 2021-11-27 NOTE — Addendum Note (Signed)
Addended by: Kandice Robinsons T on: 11/27/2021 01:18 PM   Modules accepted: Orders

## 2021-11-27 NOTE — Telephone Encounter (Addendum)
Spoke with the patient who states his reading yesterday was 130/80 pulse:67 Today: pulse 72 129/82.   Gave patient Dayna's recommendations; he voiced understanding.   Explained to the patient that we are waiting to see what the echo and monitor show.

## 2021-11-29 DIAGNOSIS — I493 Ventricular premature depolarization: Secondary | ICD-10-CM

## 2021-12-02 ENCOUNTER — Other Ambulatory Visit: Payer: Self-pay | Admitting: Cardiology

## 2021-12-02 DIAGNOSIS — I10 Essential (primary) hypertension: Secondary | ICD-10-CM

## 2021-12-09 ENCOUNTER — Telehealth: Payer: Self-pay

## 2021-12-09 MED ORDER — METOPROLOL SUCCINATE ER 25 MG PO TB24: ORAL_TABLET | ORAL | 3 refills | Status: DC

## 2021-12-09 NOTE — Telephone Encounter (Signed)
Called and spoke to patient who understands to stop Diltiazem CD. He states he has not yet picked up the Metoprolol Succ RX that was sent in for him on 11/27/21 for 25mg  daily. New RX sent in for 75mg  daily & noted to pharmacy to d/c 25 QD RX that they have for him. Patient understands to pick up and begin taking these. He confirms that he will have CT done and ECHO. Tests should be completed by 12/15/21 and pt has appt scheduled with [facility] on 01/21/22.

## 2021-12-09 NOTE — Telephone Encounter (Signed)
-----   Message from Laurann Montana, [place] sent at 12/08/2021  4:44 PM EDT ----- Please let pt know monitor showed largely normal rhythm but quite a few extra beats from the bottom of his heart (22% of his heartbeats). These are the skips we saw on the EKG int he office. I discussed with Dr. Jens Som. He would recommend we stop diltiazem and change metoprolol to 75mg  daily (he can do this in the form of 3 tablets daily) and follow symptoms. The echo will help know if we need to adjust his medicines further. Please arrange f/u with either me or Dr. [place] after completion of studies to review how he is doing. Thanks!

## 2021-12-14 ENCOUNTER — Ambulatory Visit (HOSPITAL_COMMUNITY)
Admission: RE | Admit: 2021-12-14 | Discharge: 2021-12-14 | Disposition: A | Discharge status: Home / Self Care | Payer: BC Managed Care – PPO | Source: Ambulatory Visit | Attending: Physician Assistant | Admitting: Physician Assistant

## 2021-12-14 DIAGNOSIS — Z8679 Personal history of other diseases of the circulatory system: Secondary | ICD-10-CM | POA: Insufficient documentation

## 2021-12-14 DIAGNOSIS — Z9889 Other specified postprocedural states: Secondary | ICD-10-CM | POA: Diagnosis not present

## 2021-12-14 MED ORDER — IOHEXOL 350 MG/ML SOLN
100.0000 mL | Freq: Once | INTRAVENOUS | Status: AC | PRN
  Administered 2021-12-14: 100 mL via INTRAVENOUS

## 2021-12-15 ENCOUNTER — Ambulatory Visit (HOSPITAL_COMMUNITY): Payer: BC Managed Care – PPO | Attending: Internal Medicine

## 2021-12-15 DIAGNOSIS — I4892 Unspecified atrial flutter: Secondary | ICD-10-CM

## 2021-12-15 DIAGNOSIS — Z01810 Encounter for preprocedural cardiovascular examination: Secondary | ICD-10-CM | POA: Diagnosis not present

## 2021-12-15 DIAGNOSIS — I10 Essential (primary) hypertension: Secondary | ICD-10-CM | POA: Diagnosis not present

## 2021-12-15 DIAGNOSIS — I351 Nonrheumatic aortic (valve) insufficiency: Secondary | ICD-10-CM

## 2021-12-15 DIAGNOSIS — Z9889 Other specified postprocedural states: Secondary | ICD-10-CM

## 2021-12-15 DIAGNOSIS — Z8679 Personal history of other diseases of the circulatory system: Secondary | ICD-10-CM | POA: Diagnosis present

## 2021-12-15 DIAGNOSIS — I493 Ventricular premature depolarization: Secondary | ICD-10-CM | POA: Diagnosis present

## 2021-12-15 LAB — ECHOCARDIOGRAM COMPLETE
Area-P 1/2: 4.8 cm2
S' Lateral: 3.9 cm

## 2022-01-06 ENCOUNTER — Other Ambulatory Visit: Payer: Self-pay | Admitting: Urology

## 2022-01-21 ENCOUNTER — Ambulatory Visit: Payer: BC Managed Care – PPO | Admitting: Cardiology

## 2022-04-13 NOTE — Progress Notes (Signed)
HPI: Follow-up thoracic aortic aneurysm.  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  Preoperative echocardiogram showed normal LV function, moderate diastolic dysfunction, aortic aneurysm with mild aortic insufficiency and moderate left atrial enlargement. Cardiac catheterization preoperatively showed no coronary disease. 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  Preoperative carotid Dopplers showed no significant stenosis.  Patient had repair of sinus of Valsalva aneurysm using a graft with resuspension of aortic valve 3/19.  Postoperative course complicated by atrial flutter and patient had TEE guided cardioversion.  Patient had dehiscence of his upper portion of the sternal wound and was treated with Keflex. Monitor June 2023 showed sinus rhythm with rare PAC, occasional PVC and 4 beats of nonsustained ventricular tachycardia.  Echocardiogram June 2023 showed ejection fraction 40 to 45%, moderate basal septal hypertrophy.  CTA June 2023 showed stable repair of thoracic aortic aneurysm.  Since last seen the patient denies any dyspnea on exertion, orthopnea, PND, pedal edema, palpitations, syncope or chest pain.   Current Outpatient Medications  Medication Sig Dispense Refill   amoxicillin (AMOXIL) 500 MG tablet Take 4 tablets 30-60 minutes prior to dental work 4 tablet 2   atorvastatin (LIPITOR) 20 MG tablet Take 20 mg by mouth daily.     CINNAMON PO Take by mouth daily.     EPINEPHrine 0.3 mg/0.3 mL IJ SOAJ injection Inject 0.3 mg into the muscle as needed (for allergic reaction).      fluticasone (FLONASE) 50 MCG/ACT nasal spray Place 1 spray into both nostrils as needed for allergies.     latanoprost (XALATAN) 0.005 % ophthalmic solution 1 drop at bedtime.     metoprolol succinate (TOPROL XL) 25 MG 24 hr tablet Take 3 tablets (75mg ) by mouth daily 270 tablet 3   Multiple Vitamin (MULTIVITAMIN) tablet Take 1 tablet by mouth  daily. 50 plus     tadalafil (CIALIS) 5 MG tablet TAKE 1 TABLET (5 MG TOTAL) BY MOUTH AS NEEDED FOR ERECTILE DYSFUNCTION. 10 tablet 0   valsartan (DIOVAN) 40 MG tablet Take 40 mg by mouth daily.     No current facility-administered medications for this visit.     Past Medical History:  Diagnosis Date   Aortic insufficiency    Arthritis    Atrial flutter (HCC)    Complication of anesthesia    hx of a flutter postop 2019 aneurysm repair with no recurrance, no problems with 05-14-2021 left knee meniscus repair   Diverticulitis    no flare ups in years no meds taken   Hiatal hernia    small per ct 2016   History of COVID-19 06/2020   asymptomatic   Hyperlipidemia    Hypertension    Hypertrophy of prostate    mild   Lesion of bladder    OSA on CPAP    per pt moderate osa per study   Sinus of Valsalva aneurysm    Thoracic aortic aneurysm ([place])    Wears glasses     Past Surgical History:  Procedure Laterality Date   CARDIOVERSION N/A 09/15/2017   Procedure: CARDIOVERSION;  Surgeon: Josue Hector, MD;  Location: [place];  Service: Cardiovascular;  Laterality: N/A;   COLON RESECTION  2002   colonscopy  05/2018   CYSTOSCOPY WITH STENT PLACEMENT Left 07/08/2021   Procedure: CYSTOSCOPY WITH STENT PLACEMENT;  Surgeon: Ceasar Mons, MD;  Location: [facility];  Service: Urology;  Laterality: Left;  ELBOW SURGERY Right 1999   KNEE ARTHROSCOPY Left 1995   left knee meniscus  repair  05/14/2021   RIGHT/LEFT HEART CATH AND CORONARY ANGIOGRAPHY N/A 08/24/2017   Procedure: RIGHT/LEFT HEART CATH AND CORONARY ANGIOGRAPHY;  Surgeon: Kathleene Hazel, MD;  Location: [facility];  Service: Cardiovascular;  Laterality: N/A;   SHOULDER SURGERY Bilateral right 1992/  left 1987   TEE WITHOUT CARDIOVERSION N/A 09/08/2017   Procedure: TRANSESOPHAGEAL ECHOCARDIOGRAM (TEE);  Surgeon: Loreli Slot, MD;  Location: [facility] OR;  Service: Open Heart  Surgery;  Laterality: N/A;   TEE WITHOUT CARDIOVERSION N/A 09/15/2017   Procedure: TRANSESOPHAGEAL ECHOCARDIOGRAM (TEE);  Surgeon: Wendall Stade, MD;  Location: [facility];  Service: Cardiovascular;  Laterality: N/A;   THORACIC AORTIC ANEURYSM REPAIR N/A 09/08/2017   Procedure: REPAIR OF SINUS OF VALSALVA ANEURYSM;  Surgeon: Loreli Slot, MD;  Location: [facility] OR;  Service: Open Heart Surgery;  Laterality: N/A;  Using 80mm Valsalva Gelweave Graft   TRANSURETHRAL RESECTION OF BLADDER TUMOR N/A 07/08/2021   Procedure: TRANSURETHRAL RESECTION OF BLADDER  LESION;  Surgeon: Rene Paci, MD;  Location: [facility];  Service: Urology;  Laterality: N/A;   UMBILICAL HERNIA REPAIR  10/2015   VASECTOMY Bilateral 06/10/2016   Procedure: VASECTOMY;  Surgeon: Malen Gauze, MD;  Location: [facility];  Service: Urology;  Laterality: Bilateral;    Social History   Socioeconomic History   Marital status: Divorced    Spouse name: Not on file   Number of children: 2   Years of education: Not on file   Highest education level: Not on file  Occupational History    Comment: Counseling  Tobacco Use   Smoking status: Former    Types: Cigars    Quit date: 06/28/2017    Years since quitting: 4.8   Smokeless tobacco: Never   Tobacco comments:    average cigar 2 per month  Vaping Use   Vaping Use: Never used  Substance and Sexual Activity   Alcohol use: Yes    Comment: OCCASIONAL   Drug use: No   Sexual activity: Not on file  Other Topics Concern   Not on file  Social History Narrative   Not on file   Social Determinants of Health   Financial Resource Strain: Not on file  Food Insecurity: Not on file  Transportation Needs: Not on file  Physical Activity: Not on file  Stress: Not on file  Social Connections: Not on file  Intimate Partner Violence: Not on file    Family History  Problem Relation Age of Onset   Hypertension Mother     COPD Father     ROS: no fevers or chills, productive cough, hemoptysis, dysphasia, odynophagia, melena, hematochezia, dysuria, hematuria, rash, seizure activity, orthopnea, PND, pedal edema, claudication. Remaining systems are negative.  Physical Exam: Well-developed well-nourished in no acute distress.  Skin is warm and dry.  HEENT is normal.  Neck is supple.  Chest is clear to auscultation with normal expansion.  Cardiovascular exam is regular rate and rhythm.  Abdominal exam nontender or distended. No masses palpated. Extremities show no edema. neuro grossly intact   A/P  1 history of thoracic aortic aneurysm-status post repair.  Recent CTA showed stable repair.  Continue tight control of blood pressure.  2 cardiomyopathy-LV function mildly reduced.  Continue ARB and beta-blocker.  Previous catheterization revealed no coronary disease.  We will plan follow-up echoes in the future.  3 hypertension-patient's blood  pressure is mildly elevated.  I have asked him to track this and we will increase medications as needed.  4 history of postoperative atrial flutter-no documented recurrences.  5 hyperlipidemia-continue Lipitor.  Check lipids and liver.  Olga Millers, MD

## 2022-04-27 ENCOUNTER — Ambulatory Visit: Payer: BC Managed Care – PPO | Attending: Cardiology | Admitting: Cardiology

## 2022-04-27 ENCOUNTER — Encounter: Payer: Self-pay | Admitting: Cardiology

## 2022-04-27 VITALS — BP 130/80 | HR 70 | Ht 74.5 in | Wt 308.6 lb

## 2022-04-27 DIAGNOSIS — E7800 Pure hypercholesterolemia, unspecified: Secondary | ICD-10-CM | POA: Diagnosis not present

## 2022-04-27 DIAGNOSIS — I10 Essential (primary) hypertension: Secondary | ICD-10-CM

## 2022-04-27 DIAGNOSIS — Z9889 Other specified postprocedural states: Secondary | ICD-10-CM | POA: Diagnosis not present

## 2022-04-27 DIAGNOSIS — Z8679 Personal history of other diseases of the circulatory system: Secondary | ICD-10-CM | POA: Diagnosis not present

## 2022-04-27 LAB — HEPATIC FUNCTION PANEL
ALT: 26 IU/L (ref 0–44)
AST: 24 IU/L (ref 0–40)
Albumin: 4.6 g/dL (ref 3.8–4.9)
Alkaline Phosphatase: 47 IU/L (ref 44–121)
Bilirubin Total: 0.6 mg/dL (ref 0.0–1.2)
Bilirubin, Direct: 0.2 mg/dL (ref 0.00–0.40)
Total Protein: 7.1 g/dL (ref 6.0–8.5)

## 2022-04-27 LAB — LIPID PANEL
Chol/HDL Ratio: 2.9 ratio (ref 0.0–5.0)
Cholesterol, Total: 97 mg/dL — ABNORMAL LOW (ref 100–199)
HDL: 34 mg/dL — ABNORMAL LOW (ref >39)
LDL Chol Calc (NIH): 48 mg/dL (ref 0–99)
Triglycerides: 68 mg/dL (ref 0–149)
VLDL Cholesterol Cal: 15 mg/dL (ref 5–40)

## 2022-04-27 NOTE — Patient Instructions (Signed)
  Follow-Up: At [facility], you and your health needs are our priority.  As part of our continuing mission to provide you with exceptional heart care, we have created designated Provider Care Teams.  These Care Teams include your primary Cardiologist (physician) and Advanced Practice Providers (APPs -  Physician Assistants and Nurse Practitioners) who all work together to provide you with the care you need, when you need it.  We recommend signing up for the patient portal called "MyChart".  Sign up information is provided on this After Visit Summary.  MyChart is used to connect with patients for Virtual Visits (Telemedicine).  Patients are able to view lab/test results, encounter notes, upcoming appointments, etc.  Non-urgent messages can be sent to your provider as well.   To learn more about what you can do with MyChart, go to [email or web address].    Your next appointment:   12 month(s)  The format for your next appointment:   In Person  Provider:   Brian Crenshaw, MD   

## 2022-04-29 ENCOUNTER — Encounter: Payer: Self-pay | Admitting: *Deleted

## 2022-07-08 ENCOUNTER — Other Ambulatory Visit: Payer: Self-pay | Admitting: Nurse Practitioner

## 2022-07-08 MED ORDER — FLUTICASONE PROPIONATE 50 MCG/ACT NA SUSP: 1.0000 | NASAL | 0 refills | Status: AC | PRN

## 2022-07-08 MED ORDER — AMOXICILLIN-POT CLAVULANATE 875-125 MG PO TABS: 1.0000 | ORAL_TABLET | Freq: Two times a day (BID) | ORAL | 0 refills | Status: AC

## 2022-07-30 ENCOUNTER — Other Ambulatory Visit: Payer: Self-pay | Admitting: Orthopaedic Surgery

## 2022-07-30 DIAGNOSIS — Z01818 Encounter for other preprocedural examination: Secondary | ICD-10-CM

## 2022-08-04 ENCOUNTER — Ambulatory Visit
Admission: RE | Admit: 2022-08-04 | Discharge: 2022-08-04 | Disposition: A | Discharge status: Home / Self Care | Payer: BC Managed Care – PPO | Source: Ambulatory Visit | Attending: Orthopaedic Surgery | Admitting: Orthopaedic Surgery

## 2022-08-04 DIAGNOSIS — Z01818 Encounter for other preprocedural examination: Secondary | ICD-10-CM

## 2022-09-09 ENCOUNTER — Telehealth: Payer: Self-pay | Admitting: Cardiology

## 2022-09-09 NOTE — Telephone Encounter (Signed)
Returned call to patient with Dr. Stanford Breed he reports chest pain/pulling in his chest when he stretches his arms. He said he can press on his chest and reproduce the pain. He said he thinks it is more MSK in nature. Advised that seems an appropriate assessment of his situation. Advised he contact PCP for follow up if persistent. He verbalized understanding. No further assistance needed.

## 2022-09-09 NOTE — Telephone Encounter (Signed)
Pt c/o of Chest Pain: STAT if CP now or developed within 24 hours  1. Are you having CP right now?  No   2. Are you experiencing any other symptoms (ex. SOB, nausea, vomiting, sweating)?  No   3. How long have you been experiencing CP?  Past 2 days   4. Is your CP continuous or coming and going?  Coming and going   5. Have you taken Nitroglycerin?  No  ?

## 2022-11-17 LAB — LAB REPORT - SCANNED
A1c: 6.6
Albumin, Urine POC: 42.1
Albumin/Creatinine Ratio, Urine, POC: 25
Creatinine, POC: 166.7 mg/dL
EGFR: 73
HM HIV Screening: NEGATIVE

## 2022-12-09 ENCOUNTER — Other Ambulatory Visit: Payer: Self-pay | Admitting: Cardiology

## 2022-12-09 DIAGNOSIS — I10 Essential (primary) hypertension: Secondary | ICD-10-CM

## 2022-12-24 ENCOUNTER — Other Ambulatory Visit: Payer: Self-pay | Admitting: Cardiology

## 2022-12-24 ENCOUNTER — Other Ambulatory Visit: Payer: Self-pay | Admitting: Physician Assistant

## 2022-12-24 DIAGNOSIS — I10 Essential (primary) hypertension: Secondary | ICD-10-CM

## 2023-02-08 ENCOUNTER — Ambulatory Visit: Payer: BC Managed Care – PPO | Attending: Nurse Practitioner | Admitting: Nurse Practitioner

## 2023-02-08 ENCOUNTER — Encounter: Payer: Self-pay | Admitting: Nurse Practitioner

## 2023-02-08 VITALS — BP 122/80 | HR 68 | Ht 74.0 in | Wt 310.2 lb

## 2023-02-08 DIAGNOSIS — Z9889 Other specified postprocedural states: Secondary | ICD-10-CM | POA: Diagnosis not present

## 2023-02-08 DIAGNOSIS — I493 Ventricular premature depolarization: Secondary | ICD-10-CM

## 2023-02-08 DIAGNOSIS — I428 Other cardiomyopathies: Secondary | ICD-10-CM

## 2023-02-08 DIAGNOSIS — I4892 Unspecified atrial flutter: Secondary | ICD-10-CM | POA: Diagnosis not present

## 2023-02-08 DIAGNOSIS — Z8679 Personal history of other diseases of the circulatory system: Secondary | ICD-10-CM | POA: Diagnosis not present

## 2023-02-08 DIAGNOSIS — I10 Essential (primary) hypertension: Secondary | ICD-10-CM

## 2023-02-08 DIAGNOSIS — E785 Hyperlipidemia, unspecified: Secondary | ICD-10-CM

## 2023-02-08 NOTE — Patient Instructions (Signed)
Medication Instructions:  Your physician recommends that you continue on your current medications as directed. Please refer to the Current Medication list given to you today.  *If you need a refill on your cardiac medications before your next appointment, please call your pharmacy*   Lab Work: NONE ordered at this time of appointment     Testing/Procedures: Your physician has requested that you have an echocardiogram. Echocardiography is a painless test that uses sound waves to create images of your heart. It provides your doctor with information about the size and shape of your heart and how well your heart's chambers and valves are working. This procedure takes approximately one hour. There are no restrictions for this procedure. Please do NOT wear cologne, perfume, aftershave, or lotions (deodorant is allowed). Please arrive 15 minutes prior to your appointment time.    Follow-Up: At [facility], you and your health needs are our priority.  As part of our continuing mission to provide you with exceptional heart care, we have created designated Provider Care Teams.  These Care Teams include your primary Cardiologist (physician) and Advanced Practice Providers (APPs -  Physician Assistants and Nurse Practitioners) who all work together to provide you with the care you need, when you need it.  We recommend signing up for the patient portal called "MyChart".  Sign up information is provided on this After Visit Summary.  MyChart is used to connect with patients for Virtual Visits (Telemedicine).  Patients are able to view lab/test results, encounter notes, upcoming appointments, etc.  Non-urgent messages can be sent to your provider as well.   To learn more about what you can do with MyChart, go to [email or web address].    Your next appointment:   1 year(s)  Provider:   Olga Millers, MD

## 2023-02-08 NOTE — Progress Notes (Signed)
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  Past Medical History    Past Medical History:  Diagnosis Date   Aortic insufficiency    Arthritis    Atrial flutter (HCC)    Complication of anesthesia    hx of a flutter postop 2019 aneurysm repair with no recurrance, no problems with 05-14-2021 left knee meniscus repair   Diverticulitis    no flare ups in years no meds taken   Hiatal hernia    small per ct 2016   History of COVID-19 06/2020   asymptomatic   Hyperlipidemia    Hypertension    Hypertrophy of prostate    mild   Lesion of bladder    OSA on CPAP    per pt moderate osa per study   Sinus of Valsalva aneurysm    Thoracic aortic aneurysm (HCC)    Wears glasses    Past Surgical History:  Procedure Laterality Date   CARDIOVERSION N/A 09/15/2017   Procedure: CARDIOVERSION;  Surgeon: Wendall Stade, MD;  Location: [facility] ENDOSCOPY;  Service: Cardiovascular;  Laterality: N/A;   COLON RESECTION  2002   colonscopy  05/2018   CYSTOSCOPY WITH STENT PLACEMENT Left 07/08/2021   Procedure: CYSTOSCOPY WITH STENT PLACEMENT;  Surgeon: Rene Paci, MD;  Location: [facility];  Service: Urology;  Laterality: Left;   ELBOW SURGERY Right 1999   KNEE ARTHROSCOPY Left 1995   left knee meniscus  repair  05/14/2021   RIGHT/LEFT HEART CATH AND CORONARY ANGIOGRAPHY N/A 08/24/2017   Procedure: RIGHT/LEFT HEART CATH AND CORONARY ANGIOGRAPHY;  Surgeon: Kathleene Hazel, MD;  Location: [facility] INVASIVE CV LAB;  Service:  Cardiovascular;  Laterality: N/A;   SHOULDER SURGERY Bilateral right 1992/  left 1987   TEE WITHOUT CARDIOVERSION N/A 09/08/2017   Procedure: TRANSESOPHAGEAL ECHOCARDIOGRAM (TEE);  Surgeon: Loreli Slot, MD;  Location: [facility];  Service: Open Heart Surgery;  Laterality: N/A;   TEE WITHOUT CARDIOVERSION N/A 09/15/2017   Procedure: TRANSESOPHAGEAL ECHOCARDIOGRAM (TEE);  Surgeon: Wendall Stade, MD;  Location: [facility] ENDOSCOPY;  Service: Cardiovascular;  Laterality: N/A;   THORACIC AORTIC ANEURYSM REPAIR N/A 09/08/2017   Procedure: REPAIR OF SINUS OF VALSALVA ANEURYSM;  Surgeon: Loreli Slot, MD;  Location: [facility] OR;  Service: Open Heart Surgery;  Laterality: N/A;  Using 30mm Valsalva Gelweave Graft   TRANSURETHRAL RESECTION OF BLADDER TUMOR N/A 07/08/2021   Procedure: TRANSURETHRAL RESECTION OF BLADDER  LESION;  Surgeon: Rene Paci, MD;  Location: [facility];  Service: Urology;  Laterality: N/A;   UMBILICAL HERNIA REPAIR  10/2015   VASECTOMY Bilateral 06/10/2016   Procedure: VASECTOMY;  Surgeon: Malen Gauze, MD;  Location: [facility];  Service: Urology;  Laterality: Bilateral;    Allergies  Allergies  Allergen Reactions   Shellfish Allergy Hives, Swelling and Rash    SWELLING REACTION UNSPECIFIED  SWELLING REACTION UNSPECIFIED    Oxycodone Other (See Comments)    Having severe hallucinations     Labs/Other Studies Reviewed    The following studies were reviewed today:  Cardiac Studies &  Procedures   CARDIAC CATHETERIZATION  CARDIAC CATHETERIZATION 08/24/2017  Narrative 1. Technically difficult cardiac catheterization given the severe enlargement of the aortic root. 2. No angiographic evidence of CAD. 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 3. Normal right and left heart pressures.  Recommendations: Continue planning for aortic root replacement.  Findings Coronary Findings Diagnostic  Dominance:  Right  Left Anterior Descending Vessel is large.  Left Circumflex  First Obtuse Marginal Branch Vessel is large in size.  Right Coronary Artery Vessel is large.  Intervention  No interventions have been documented.     ECHOCARDIOGRAM  ECHOCARDIOGRAM COMPLETE 12/15/2021  Narrative ECHOCARDIOGRAM REPORT    Patient Name:   Matthew Hoffman Date of Exam: 12/15/2021 Medical Rec #:  [id number]        Height:       74.5 in Accession #:    [id number]       Weight:       306.0 lb Date of Birth:  [birth year]        BSA:          2.616 m Patient Age:    55 years         BP:           147/97 mmHg Patient Gender: M                HR:           47 bpm. Exam Location:  [address]  Procedure: 2D Echo, Cardiac Doppler and Color Doppler  Indications:    Z01.810 Preop cardiovascular exam  History:        Patient has prior history of Echocardiogram examinations, most recent 08/19/2017. Arrythmias:Atrial Flutter and PVC; Risk Factors:Sleep Apnea, Hypertension and HLD. Hx if sinus of Valsalva thoracic aortic aneurysm (2019).  Sonographer:    Clearence Ped RCS Referring Phys: [id number] DAYNA N DUNN  IMPRESSIONS   1. Frequent PVCs. Left ventricular ejection fraction, by estimation, is 40 to 45%. The left ventricle has mildly decreased function. The left ventricle demonstrates global hypokinesis. There is moderate asymmetric left ventricular hypertrophy of the basal-septal segment. Left ventricular diastolic parameters are indeterminate. 2. Right ventricular systolic function is normal. The right ventricular size is normal. 3. The mitral valve is grossly normal. Trivial mitral valve regurgitation. 4. The aortic valve is tricuspid. Aortic valve regurgitation is not visualized. 5. Aortic no significant aortic root/ascending aneurysm. 6. The inferior vena cava is normal in size with greater than 50% respiratory variability, suggesting right atrial pressure of 3  mmHg.  Conclusion(s)/Recommendation(s): Compared to prior echo report, EF has declined, although sensitive EF assessment challenging with PVCs.  FINDINGS Left Ventricle: Frequent PVCs. Left ventricular ejection fraction, by estimation, is 40 to 45%. The left ventricle has mildly decreased function. The left ventricle demonstrates global hypokinesis. The left ventricular internal cavity size was normal in size. There is moderate asymmetric left ventricular hypertrophy of the basal-septal segment. Left ventricular diastolic parameters are indeterminate.  Right Ventricle: The right ventricular size is normal. Right ventricular systolic function is normal.  Left Atrium: Left atrial size was normal in size.  Right Atrium: Right atrial size was normal in size.  Pericardium: There is no evidence of pericardial effusion.  Mitral Valve: The mitral valve is grossly normal. There is mild thickening of the mitral valve leaflet(s). Trivial mitral valve regurgitation.  Tricuspid Valve: The tricuspid valve is normal in structure. Tricuspid valve regurgitation is not demonstrated.  Aortic Valve: The aortic valve  is tricuspid. Aortic valve regurgitation is not visualized.  Pulmonic Valve: The pulmonic valve was normal in structure. Pulmonic valve regurgitation is trivial.  Aorta: No significant aortic root/ascending aneurysm.  Venous: The inferior vena cava is normal in size with greater than 50% respiratory variability, suggesting right atrial pressure of 3 mmHg.  IAS/Shunts: No atrial level shunt detected by color flow Doppler.   LEFT VENTRICLE PLAX 2D LVIDd:         4.90 cm   Diastology LVIDs:         3.90 cm   LV e' medial:    10.80 cm/s LV PW:         1.30 cm   LV E/e' medial:  4.9 LV IVS:        1.30 cm   LV e' lateral:   11.70 cm/s LVOT diam:     2.30 cm   LV E/e' lateral: 4.5 LV SV:         85 LV SV Index:   32 LVOT Area:     4.15 cm   RIGHT VENTRICLE RV Basal diam:  3.70 cm RV S  prime:     11.00 cm/s TAPSE (M-mode): 2.1 cm  LEFT ATRIUM             Index        RIGHT ATRIUM           Index LA diam:        4.30 cm 1.64 cm/m   RA Area:     13.80 cm LA Vol (A2C):   58.3 ml 22.29 ml/m  RA Volume:   31.30 ml  11.96 ml/m LA Vol (A4C):   54.3 ml 20.76 ml/m LA Biplane Vol: 55.5 ml 21.21 ml/m AORTIC VALVE LVOT Vmax:   81.40 cm/s LVOT Vmean:  62.600 cm/s LVOT VTI:    0.204 m  AORTA Ao Root diam: 3.30 cm Ao Asc diam:  3.60 cm  MITRAL VALVE MV Area (PHT):             SHUNTS MV Decel Time:             Systemic VTI:  0.20 m MV E velocity: 52.40 cm/s  Systemic Diam: 2.30 cm MV A velocity: 60.30 cm/s MV E/A ratio:  0.87  [place] signed by Carolan Clines Signature Date/Time: 12/15/2021/10:54:38 AM    Final   TEE  ECHO TEE 09/15/2017  Narrative *[place]* *[facility]* [address] [place], [place] [address] [phone]  ------------------------------------------------------------------- Transesophageal Echocardiography  Patient:    Ritik, Whitehorn MR #:       [id number] Study Date: 09/15/2017 Gender:     M Age:        51 Height:     188 cm Weight:     133.2 kg BSA:        2.69 m^2 Pt. Status: Room:       4E15C  PERFORMING   Charlton Haws, M.D. ORDERING     Olga Millers REFERRING    [place] ADMITTING    Charlett Lango, MD ATTENDING    Charlett Lango, MD SONOGRAPHER  Roosvelt Maser, RDCS  cc:  ------------------------------------------------------------------- LV EF: 55% -   60%  ------------------------------------------------------------------- Indications:      Atrial flutter 427.32.  ------------------------------------------------------------------- History:   PMH:  Recent repair of sinus of Valsalva Aneurysm with resuspension of the aortic valve.  Risk factors:  Hypertension.  ------------------------------------------------------------------- Study Conclusions  - Left  ventricle:  Systolic function was normal. The estimated ejection fraction was in the range of 55% to 60%. - Aortic valve: Recent biological valved conduit with no significant AR and normal leaflet motion. Normal aortic root size 3.0 cm - Left atrium: No evidence of thrombus in the atrial cavity or appendage. - Right atrium: No evidence of thrombus in the atrial cavity or appendage. - Atrial septum: No defect or patent foramen ovale was identified. Echo contrast study showed no right-to-left atrial level shunt, following an increase in RA pressure induced by provocative maneuvers. - Impressions: No LAA thrombus Proceeded with [place] x 1 Patient converted to NSR.  Impressions:  - No LAA thrombus Proceeded with DCC x 1 Patient converted to NSR.  ------------------------------------------------------------------- Study data:   Study status:  Routine.  Consent:  The risks, benefits, and alternatives to the procedure were explained to the patient and informed consent was obtained.  Procedure:  The patient reported no pain pre or post test. Initial setup. The patient was brought to the laboratory. Surface ECG leads were monitored. Sedation. Deep sedation was administered by anesthesiology staff. Transesophageal echocardiography. Topical anesthesia was obtained using viscous lidocaine. An adult multiplane transesophageal probe was inserted by the attending cardiologistwithout difficulty. Image quality was adequate.  Study completion:  The patient tolerated the procedure well. There were no complications.          Diagnostic transesophageal echocardiography.  2D and color Doppler. Birthdate:  Patient birthdate: 11-20-1965.  Age:  Patient is 57 yr old.  Sex:  Gender: male.    BMI: 37.7 kg/m^2.  Blood pressure: 108/47  Patient status:  Inpatient.  Study date:  Study date: 09/15/2017. Study time: 11:12 AM.  Location:   Endoscopy.  -------------------------------------------------------------------  ------------------------------------------------------------------- Left ventricle:  Systolic function was normal. The estimated ejection fraction was in the range of 55% to 60%.  ------------------------------------------------------------------- Aortic valve:  Recent biological valved conduit with no significant AR and normal leaflet motion. Normal aortic root size 3.0 cm  ------------------------------------------------------------------- Aorta:  The aorta was normal, not dilated, and non-diseased.  ------------------------------------------------------------------- Mitral valve:   Doppler:  There was trivial regurgitation.  ------------------------------------------------------------------- Left atrium:   No evidence of thrombus in the atrial cavity or appendage.  ------------------------------------------------------------------- Atrial septum:  No defect or patent foramen ovale was identified. Echo contrast study showed no right-to-left atrial level shunt, following an increase in RA pressure induced by provocative maneuvers.  ------------------------------------------------------------------- Right ventricle:  The cavity size was normal. Wall thickness was normal. Systolic function was normal.  ------------------------------------------------------------------- Pulmonic valve:    Doppler:  There was mild regurgitation.  ------------------------------------------------------------------- Tricuspid valve:   Doppler:  There was mild regurgitation.  ------------------------------------------------------------------- Right atrium:  The atrium was normal in size.  No evidence of thrombus in the atrial cavity or appendage.  ------------------------------------------------------------------- Pericardium:  The pericardium was normal in appearance. There was no pericardial  effusion.  ------------------------------------------------------------------- Post procedure conclusions Ascending Aorta:  - The aorta was normal, not dilated, and non-diseased.  ------------------------------------------------------------------- Prepared and Electronically Authenticated by  Charlton Haws, M.D. 2019-03-22T09:37:12   MONITORS  LONG TERM MONITOR (3-14 DAYS) 12/08/2021  Narrative Patch Wear Time:  2 days and 23 hours (2023-06-04T21:05:00-0400 to 2023-06-07T20:42:18-0400)  Patient had a min HR of 50 bpm, max HR of 174 bpm, and avg HR of 73 bpm. Predominant underlying rhythm was Sinus Rhythm. 1 run of Ventricular Tachycardia occurred lasting 4 beats with a max rate of 174 bpm (avg 167 bpm). Isolated SVEs were rare (<1.0%), SVE Couplets were rare (<1.0%),  and no SVE Triplets were present. Isolated VEs were frequent (22.4%, [id number]), VE Couplets were rare (<1.0%, 376), and no VE Triplets were present. Ventricular Bigeminy and Trigeminy were present.   Sinus bradycardia, normal sinus rhythm, sinus tachycardia, rare PAC, occasional PVCs and 4 beats nonsustained ventricular tachycardia. 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  CT in February 2019 as part of an evaluation for hematuria showed 7.3 cm sinus of Valsalva aortic aneurysm.  Preoperative echocardiogram showed normal LV function, moderate diastolic dysfunction, aortic aneurysm with mild aortic insufficiency, moderate LAE.   Cardiac catheterization at the time showed no evidence of coronary artery disease, aneurysmal proximal RCA.  Carotid ultrasound showed no significant stenosis.  He underwent repair of sinus of Valsalva aneurysm using a graft with resuspension of the aortic valve in March 2019.  Postoperative course was complicated by atrial flutter and he underwent TEE guided cardioversion.  Additionally, he had dehiscence of the upper portion of his sternal wound and required treatment with antibiotics.  Cardiac monitor in June 2023 showed sinus rhythm with rare PACs, PVCs (22% burden), 4 beats of NSVT.  Echocardiogram in June 2023 showed EF 40 to 45%, moderate basal septal hypertrophy.  He was transitioned from diltiazem to metoprolol.  CTA chest aorta in June 2023 showed stable repair of thoracic aortic aneurysm. He was last seen in the office on 04/27/2022 and was doing well from a cardiac standpoint.  He denied symptoms concerning for angina.  BP was well-controlled.  He presents today for follow-up.  Since his last visit he has done well from a cardiac standpoint.  He denies any symptoms concerning for angina, denies palpitations, dyspnea, edema, PND, orthopnea, weight gain. He was started on a low-dose of amlodipine per his PCP in the setting of mildly elevated BP.  He has noted some mild bilateral ankle edema since starting amlodipine. He plans to discuss possibly discontinuing amlodipine with his PCP when he follows up with him in late August.  BP has been well-controlled. Overall, he reports feeling well.  Home Medications    Current Outpatient Medications  Medication Sig Dispense Refill   amLODipine (NORVASC) 2.5 MG tablet Take 1 tablet by mouth daily.     atorvastatin (LIPITOR) 20 MG tablet Take 20 mg by mouth daily.     CINNAMON PO Take by mouth daily.     dorzolamide-timolol (COSOPT) 2-0.5 % ophthalmic solution Place 1 drop into both eyes 2 (two) times daily.     EPINEPHrine 0.3 mg/0.3 mL IJ SOAJ injection  Inject 0.3 mg into the muscle as needed (for allergic reaction).      fluticasone (FLONASE) 50 MCG/ACT nasal spray Place 1 spray into both nostrils as needed for allergies. 16 g 0   latanoprost (XALATAN) 0.005 % ophthalmic solution 1 drop at bedtime.     metoprolol succinate (TOPROL-XL) 25 MG 24 hr tablet TAKE 3 TABLETS (75MG ) BY MOUTH DAILY 270 tablet 1  Multiple Vitamin (MULTIVITAMIN) tablet Take 1 tablet by mouth daily. 50 plus     tadalafil (CIALIS) 5 MG tablet TAKE 1 TABLET (5 MG TOTAL) BY MOUTH AS NEEDED FOR ERECTILE DYSFUNCTION. 10 tablet 0   valsartan (DIOVAN) 40 MG tablet Take 40 mg by mouth daily.     No current facility-administered medications for this visit.     Review of Systems    He denies chest pain, palpitations, dyspnea, pnd, orthopnea, n, v, dizziness, syncope, edema, weight gain, or early satiety. All other systems reviewed and are otherwise negative except as noted above.   Physical Exam    VS:  BP 122/80 (BP Location: Right Arm, Patient Position: Sitting, Cuff Size: Large)   Pulse 68   Ht 6\' 2"  (1.88 m)   Wt (!) 310 lb 3.2 oz (140.7 kg)   SpO2 97%   BMI 39.83 kg/m   GEN: Well nourished, well developed, in no acute distress. HEENT: normal. Neck: Supple, no JVD, carotid bruits, or masses. Cardiac: RRR with audible ectopy, no murmurs, rubs, or gallops. No clubbing, cyanosis, edema.  Radials/DP/PT 2+ and equal bilaterally.  Respiratory:  Respirations regular and unlabored, clear to auscultation bilaterally. GI: Soft, nontender, nondistended, BS + x 4. MS: no deformity or atrophy. Skin: warm and dry, no rash. Neuro:  Strength and sensation are intact. Psych: Normal affect.  Accessory Clinical Findings    ECG personally reviewed by me today - EKG Interpretation Date/Time:  Tuesday February 08 2023 09:24:51 EDT Ventricular Rate:  68 PR Interval:  190 QRS Duration:  122 QT Interval:  440 QTC Calculation: 467 R Axis:   64  Text Interpretation: Sinus rhythm  with frequent Premature ventricular complexes Confirmed by Bernadene Person ([id number]) on 02/08/2023 2:02:20 PM  -sinus rhythm, 68 bpm, frequent PVCs, no acute changes.   Lab Results  Component Value Date   WBC 4.9 11/24/2021   HGB 14.6 11/24/2021   HCT 43.3 11/24/2021   MCV 81 11/24/2021   PLT 245 11/24/2021   Lab Results  Component Value Date   CREATININE 1.22 11/24/2021   BUN 19 11/24/2021   NA 141 11/24/2021   K 4.3 11/24/2021   CL 102 11/24/2021   CO2 23 11/24/2021   Lab Results  Component Value Date   ALT 26 04/27/2022   AST 24 04/27/2022   ALKPHOS 47 04/27/2022   BILITOT 0.6 04/27/2022   Lab Results  Component Value Date   CHOL 97 (L) 04/27/2022   HDL 34 (L) 04/27/2022   LDLCALC 48 04/27/2022   TRIG 68 04/27/2022   CHOLHDL 2.9 04/27/2022    Lab Results  Component Value Date   HGBA1C 5.8 (H) 09/06/2017    Assessment & Plan    1. Thoracic aortic aneurysm: S/p repair of sinus of Valsalva aneurysm using a graft with resuspension of the aortic valve in March 2019. CTA chest aorta in June 2023 showed stable repair of thoracic aortic aneurysm. Repeat echo pending as below.  Pending echo results, will discuss need for follow-up CT with Dr. Jens Som.   2. Atrial flutter: Occurred postop. S/p TEE guided cardioversion. No recurrence. Not on anticoagulation. Continue metoprolol.   3. PVCs: Cardiac monitor in June 2023 showed sinus rhythm with rare PACs, PVCs (22% burden), 4 beats of NSVT.  He was transitioned from diltiazem to metoprolol.  4. NICM: Echocardiogram in June 2023 showed EF 40 to 45%, moderate basal septal hypertrophy. Euvolemic and well compensated on exam. Will repeat echo.  Continue valsartan, metoprolol.  5. Hypertension: BP well controlled.  He was recently started on amlodipine per PCP.  He has noticed some mild bilateral ankle swelling since starting this medication and plans to discuss possible discontinuation of amlodipine with his PCP when he follows up with  him in late August. For now, continue current antihypertensive regimen.   6. Hyperlipidemia: LDL was 49 in 10/2022.  Continue Lipitor.  7. Disposition: Follow-up in 1 year, sooner if needed.      Joylene Grapes, NP 02/08/2023, 2:07 PM

## 2023-02-25 ENCOUNTER — Ambulatory Visit (HOSPITAL_COMMUNITY): Payer: BC Managed Care – PPO | Attending: Cardiology

## 2023-02-25 DIAGNOSIS — I428 Other cardiomyopathies: Secondary | ICD-10-CM | POA: Insufficient documentation

## 2023-02-25 DIAGNOSIS — Z9889 Other specified postprocedural states: Secondary | ICD-10-CM | POA: Insufficient documentation

## 2023-02-25 DIAGNOSIS — I493 Ventricular premature depolarization: Secondary | ICD-10-CM | POA: Insufficient documentation

## 2023-02-25 DIAGNOSIS — Z8679 Personal history of other diseases of the circulatory system: Secondary | ICD-10-CM | POA: Insufficient documentation

## 2023-02-25 LAB — ECHOCARDIOGRAM COMPLETE
AR max vel: 2.34 cm2
AV Area VTI: 2.45 cm2
AV Area mean vel: 2.29 cm2
AV Mean grad: 6 mmHg
AV Peak grad: 10.5 mmHg
Ao pk vel: 1.62 m/s
Area-P 1/2: 2.48 cm2
S' Lateral: 3.2 cm

## 2023-03-03 ENCOUNTER — Telehealth: Payer: Self-pay

## 2023-03-03 NOTE — Telephone Encounter (Signed)
Spoke with pt. Pt was notified of echo results. Pt will continue current medication and f/u as planned.  

## 2023-03-18 LAB — LAB REPORT - SCANNED
A1c: 6.7
EGFR: 85

## 2023-04-01 ENCOUNTER — Other Ambulatory Visit: Payer: Self-pay

## 2023-04-01 ENCOUNTER — Emergency Department (HOSPITAL_BASED_OUTPATIENT_CLINIC_OR_DEPARTMENT_OTHER)
Admission: EM | Admit: 2023-04-01 | Discharge: 2023-04-01 | Disposition: A | Discharge status: Home / Self Care | Payer: BC Managed Care – PPO | Attending: Emergency Medicine | Admitting: Emergency Medicine

## 2023-04-01 ENCOUNTER — Encounter (HOSPITAL_BASED_OUTPATIENT_CLINIC_OR_DEPARTMENT_OTHER): Payer: Self-pay

## 2023-04-01 DIAGNOSIS — I10 Essential (primary) hypertension: Secondary | ICD-10-CM | POA: Insufficient documentation

## 2023-04-01 DIAGNOSIS — R519 Headache, unspecified: Secondary | ICD-10-CM | POA: Insufficient documentation

## 2023-04-01 DIAGNOSIS — M542 Cervicalgia: Secondary | ICD-10-CM | POA: Diagnosis not present

## 2023-04-01 DIAGNOSIS — Y92410 Unspecified street and highway as the place of occurrence of the external cause: Secondary | ICD-10-CM | POA: Diagnosis not present

## 2023-04-01 DIAGNOSIS — Z79899 Other long term (current) drug therapy: Secondary | ICD-10-CM | POA: Diagnosis not present

## 2023-04-01 MED ORDER — KETOROLAC TROMETHAMINE 30 MG/ML IJ SOLN
30.0000 mg | Freq: Once | INTRAMUSCULAR | Status: AC
  Administered 2023-04-01: 30 mg via INTRAMUSCULAR
  Filled 2023-04-01: qty 1
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
## 2023-04-01 NOTE — Discharge Instructions (Signed)
For letting us evaluate you today.  No imaging was required based on reassuring physical exam.  Please follow-up with primary care provider for routine medical maintenance.  You may use lidocaine patches and ibuprofen at home for pain management as needed.  Return to the emergency department if you experience altered mental status, visual disturbances, seizures, loss of consciousness

## 2023-05-04 NOTE — Progress Notes (Signed)
..  Declined SDOH  Pt advised to f/u w/PCP about elevated BP, pt states that is under a little stress at the moment -Consequences of high BP informational pamphlet given

## 2023-06-14 ENCOUNTER — Encounter: Payer: Self-pay | Admitting: *Deleted

## 2023-06-14 NOTE — Progress Notes (Signed)
//  Pt attended 05/04/23 screening event where his b/p was 142/79. At the when his bp was  event, the pt shared that his PCP is Dr. Ralene Ok and that he had insurance, was not a smoker and he did not identify any SDOH insecurities. Chart review indicated that pt saw his PCP on 03/17/23 when his b/p was 134/76. Although Dr. Ludwig Clarks does not document in [facility]-visible encounters, this Sept visit was in the pt's media chart. During event f/u call today, pt stated he just saw Dr. Ludwig Clarks last week and told him his event results; and, at his PCP visit last week, his b/p was 06301. Pt also denied any current SDOH needs. No additional health equity team support indicated at this time.

## 2023-06-21 ENCOUNTER — Other Ambulatory Visit: Payer: Self-pay | Admitting: Nurse Practitioner

## 2023-06-21 DIAGNOSIS — B9689 Other specified bacterial agents as the cause of diseases classified elsewhere: Secondary | ICD-10-CM

## 2023-06-21 MED ORDER — PROMETHAZINE-DM 6.25-15 MG/5ML PO SYRP
5.0000 mL | ORAL_SOLUTION | Freq: Four times a day (QID) | ORAL | 0 refills | Status: AC | PRN
Start: 2023-06-21 — End: ?

## 2023-06-21 MED ORDER — AMOXICILLIN-POT CLAVULANATE 875-125 MG PO TABS
1.0000 | ORAL_TABLET | Freq: Two times a day (BID) | ORAL | 0 refills | Status: AC
Start: 2023-06-21 — End: 2023-06-28

## 2023-07-05 ENCOUNTER — Other Ambulatory Visit: Payer: Self-pay | Admitting: Cardiology

## 2024-01-24 NOTE — Progress Notes (Signed)
 HPI: Follow-up thoracic aortic aneurysm.  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  Preoperative echocardiogram showed normal LV function, moderate diastolic dysfunction, aortic aneurysm with mild aortic insufficiency and moderate left atrial enlargement. Cardiac catheterization preoperatively showed no coronary disease. 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  Preoperative carotid Dopplers showed no significant stenosis.  Patient had repair of sinus of Valsalva aneurysm using a graft with resuspension of aortic valve 3/19.  Postoperative course complicated by atrial flutter and patient had TEE guided cardioversion.  Patient had dehiscence of his upper portion of the sternal wound and was treated with Keflex . Monitor June 2023 showed sinus rhythm with rare PAC, occasional PVC and 4 beats of nonsustained ventricular tachycardia.  CTA June 2023 showed stable repair of thoracic aortic aneurysm.  Echocardiogram August 2024 showed ejection fraction 40 to 45%, mild left ventricular hypertrophy.  Since last seen patient denies dyspnea, chest pain, palpitations or syncope.  Current Outpatient Medications  Medication Sig Dispense Refill   amLODipine (NORVASC) 2.5 MG tablet Take 1 tablet by mouth daily.     atorvastatin (LIPITOR) 20 MG tablet Take 20 mg by mouth daily.     CINNAMON PO Take by mouth daily.     dorzolamide-timolol (COSOPT) 2-0.5 % ophthalmic solution Place 1 drop into both eyes 2 (two) times daily.     EPINEPHrine  0.3 mg/0.3 mL IJ SOAJ injection Inject 0.3 mg into the muscle as needed (for allergic reaction).      fluticasone  (FLONASE ) 50 MCG/ACT nasal spray Place 1 spray into both nostrils as needed for allergies. 16 g 0   latanoprost (XALATAN) 0.005 % ophthalmic solution 1 drop at bedtime.     metoprolol  succinate (TOPROL -XL) 25 MG 24 hr tablet TAKE 3 TABLETS (75MG ) BY MOUTH DAILY 270 tablet 2   Multiple Vitamin  (MULTIVITAMIN) tablet Take 1 tablet by mouth daily. 50 plus     tadalafil  (CIALIS ) 5 MG tablet TAKE 1 TABLET (5 MG TOTAL) BY MOUTH AS NEEDED FOR ERECTILE DYSFUNCTION. 10 tablet 0   valsartan  (DIOVAN ) 40 MG tablet Take 40 mg by mouth daily.     promethazine -dextromethorphan (PROMETHAZINE -DM) 6.25-15 MG/5ML syrup Take 5 mLs by mouth 4 (four) times daily as needed for cough. (Patient not taking: Reported on 02/07/2024) 240 mL 0   No current facility-administered medications for this visit.     Past Medical History:  Diagnosis Date   Aortic insufficiency    Arthritis    Atrial flutter (HCC)    Complication of anesthesia    hx of a flutter postop 2019 aneurysm repair with no recurrance, no problems with 05-14-2021 left knee meniscus repair   Diverticulitis    no flare ups in years no meds taken   Hiatal hernia    small per ct 2016   History of COVID-19 06/2020   asymptomatic   Hyperlipidemia    Hypertension    Hypertrophy of prostate    mild   Lesion of bladder    OSA on CPAP    per pt moderate osa per study   Sinus of Valsalva aneurysm    Thoracic aortic aneurysm (HCC)    Wears glasses     Past Surgical History:  Procedure Laterality Date   CARDIOVERSION N/A 09/15/2017   Procedure: CARDIOVERSION;  Surgeon: Delford Maude BROCKS, MD;  Location: [facility] ENDOSCOPY;  Service: Cardiovascular;  Laterality: N/A;   COLON RESECTION  2002   colonscopy  05/2018   CYSTOSCOPY WITH STENT PLACEMENT Left 07/08/2021   Procedure: CYSTOSCOPY WITH STENT PLACEMENT;  Surgeon: Devere Lonni Righter, MD;  Location: [facility];  Service: Urology;  Laterality: Left;   ELBOW SURGERY Right 1999   KNEE ARTHROSCOPY Left 1995   left knee meniscus  repair  05/14/2021   RIGHT/LEFT HEART CATH AND CORONARY ANGIOGRAPHY N/A 08/24/2017   Procedure: RIGHT/LEFT HEART CATH AND CORONARY ANGIOGRAPHY;  Surgeon: Verlin Lonni BIRCH, MD;  Location: [facility] INVASIVE CV LAB;  Service: Cardiovascular;  Laterality:  N/A;   SHOULDER SURGERY Bilateral right 1992/  left 1987   TEE WITHOUT CARDIOVERSION N/A 09/08/2017   Procedure: TRANSESOPHAGEAL ECHOCARDIOGRAM (TEE);  Surgeon: Kerrin Elspeth BROCKS, MD;  Location: [facility] OR;  Service: Open Heart Surgery;  Laterality: N/A;   TEE WITHOUT CARDIOVERSION N/A 09/15/2017   Procedure: TRANSESOPHAGEAL ECHOCARDIOGRAM (TEE);  Surgeon: Delford Maude BROCKS, MD;  Location: [facility] ENDOSCOPY;  Service: Cardiovascular;  Laterality: N/A;   THORACIC AORTIC ANEURYSM REPAIR N/A 09/08/2017   Procedure: REPAIR OF SINUS OF VALSALVA ANEURYSM;  Surgeon: Kerrin Elspeth BROCKS, MD;  Location: [facility] OR;  Service: Open Heart Surgery;  Laterality: N/A;  Using 30mm Valsalva Gelweave Graft   TRANSURETHRAL RESECTION OF BLADDER TUMOR N/A 07/08/2021   Procedure: TRANSURETHRAL RESECTION OF BLADDER  LESION;  Surgeon: Devere Lonni Righter, MD;  Location: [facility];  Service: Urology;  Laterality: N/A;   UMBILICAL HERNIA REPAIR  10/2015   VASECTOMY Bilateral 06/10/2016   Procedure: VASECTOMY;  Surgeon: Belvie LITTIE Clara, MD;  Location: [facility];  Service: Urology;  Laterality: Bilateral;    Social History   Socioeconomic History   Marital status: Divorced    Spouse name: Not on file   Number of children: 2   Years of education: Not on file   Highest education level: Not on file  Occupational History    Comment: Counseling  Tobacco Use   Smoking status: Former    Types: Cigars    Quit date: 06/28/2017    Years since quitting: 6.6   Smokeless tobacco: Never   Tobacco comments:    average cigar 2 per month  Vaping Use   Vaping status: Never Used  Substance and Sexual Activity   Alcohol use: Yes    Comment: OCCASIONAL   Drug use: No   Sexual activity: Not on file  Other Topics Concern   Not on file  Social History Narrative   Not on file   Social Drivers of Health   Financial Resource Strain: Not on file  Food Insecurity: Patient Declined (05/04/2023)    Hunger Vital Sign    Worried About Running Out of Food in the Last Year: Patient declined    Ran Out of Food in the Last Year: Patient declined  Transportation Needs: Patient Declined (05/04/2023)   PRAPARE - Administrator, Civil Service (Medical): Patient declined    Lack of Transportation (Non-Medical): Patient declined  Physical Activity: Not on file  Stress: Not on file  Social Connections: Not on file  Intimate Partner Violence: Patient Declined (05/04/2023)   Humiliation, Afraid, Rape, and Kick questionnaire    Fear of Current or Ex-Partner: Patient declined    Emotionally Abused: Patient declined    Physically Abused: Patient declined    Sexually Abused: Patient declined    Family History  Problem Relation Age of Onset   Hypertension Mother    COPD Father     ROS: no fevers or chills, productive cough,  hemoptysis, dysphasia, odynophagia, melena, hematochezia, dysuria, hematuria, rash, seizure activity, orthopnea, PND, pedal edema, claudication. Remaining systems are negative.  Physical Exam: Well-developed well-nourished in no acute distress.  Skin is warm and dry.  HEENT is normal.  Neck is supple.  Chest is clear to auscultation with normal expansion.  Cardiovascular exam is regular rate and rhythm.  Abdominal exam nontender or distended. No masses palpated. Extremities show no edema. neuro grossly intact  EKG Interpretation Date/Time:  Tuesday February 07 2024 09:18:24 EDT Ventricular Rate:  55 PR Interval:  204 QRS Duration:  136 QT Interval:  472 QTC Calculation: 451 R Axis:   53  Text Interpretation: Sinus bradycardia with frequent Premature ventricular complexes Non-specific intra-ventricular conduction block T wave abnormality, consider lateral ischemia Confirmed by Pietro Rogue ([id number]) on 02/07/2024 9:20:42 AM ECG unchanged compared to 02/08/2023.   A/P  1 cardiomyopathy-LV function mildly reduced on most recent echocardiogram.  Note  preoperative catheterization prior to aneurysm repair showed no coronary disease.  Question hypertensive mediated.  Will discontinue valsartan  and amlodipine and treat with Entresto  49/51 twice daily.  Check potassium and renal function in 1 week.  Continue Toprol .  Will likely repeat echocardiogram when he returns in 6 months.  2 status post thoracic aortic aneurysm repair-most recent CTA showed stable repair.  3 hypertension-patient's blood pressure is elevated.  Discontinue amlodipine and valsartan  as outlined above.  Add Entresto  49/51 twice daily and advance regimen as needed.  4 hyperlipidemia-continue statin.  Check lipids and liver.  5 history of postoperative atrial flutter  Rogue Pietro, MD

## 2024-02-07 ENCOUNTER — Encounter: Payer: Self-pay | Admitting: *Deleted

## 2024-02-07 ENCOUNTER — Ambulatory Visit: Attending: Cardiology | Admitting: Cardiology

## 2024-02-07 ENCOUNTER — Other Ambulatory Visit: Payer: Self-pay | Admitting: *Deleted

## 2024-02-07 ENCOUNTER — Encounter: Payer: Self-pay | Admitting: Cardiology

## 2024-02-07 ENCOUNTER — Other Ambulatory Visit (HOSPITAL_COMMUNITY): Payer: Self-pay

## 2024-02-07 VITALS — BP 155/84 | HR 55 | Ht 74.0 in | Wt 313.8 lb

## 2024-02-07 DIAGNOSIS — E785 Hyperlipidemia, unspecified: Secondary | ICD-10-CM | POA: Diagnosis not present

## 2024-02-07 DIAGNOSIS — I10 Essential (primary) hypertension: Secondary | ICD-10-CM | POA: Diagnosis not present

## 2024-02-07 DIAGNOSIS — Z9889 Other specified postprocedural states: Secondary | ICD-10-CM | POA: Diagnosis not present

## 2024-02-07 DIAGNOSIS — I428 Other cardiomyopathies: Secondary | ICD-10-CM | POA: Diagnosis not present

## 2024-02-07 DIAGNOSIS — Z8679 Personal history of other diseases of the circulatory system: Secondary | ICD-10-CM

## 2024-02-07 MED ORDER — SACUBITRIL-VALSARTAN 49-51 MG PO TABS: 1.0000 | ORAL_TABLET | Freq: Two times a day (BID) | ORAL | 11 refills | Status: DC

## 2024-02-07 MED ORDER — METOPROLOL SUCCINATE ER 25 MG PO TB24: ORAL_TABLET | ORAL | Status: DC

## 2024-02-07 MED ORDER — SACUBITRIL-VALSARTAN 49-51 MG PO TABS: 1.0000 | ORAL_TABLET | Freq: Two times a day (BID) | ORAL | 3 refills | Status: DC

## 2024-02-07 NOTE — Patient Instructions (Signed)
 Medication Instructions:  Stop: Amlodipine (Norvasc) STop: Valsartan  (Diovan )  Start: Sacubitril -Valsartan  (Entresto ) 49-51 mg, one tablet, two times daily  Lab Work:  In about ONE WEEK (due 02/14/2024), please have a FASTING Lipid Panel, Liver Function Panel, and BMET drawn at any [facility] or back at our new building on [address]; no appointment needed; Mon-Fri 8am-4:45pm  If you have labs (blood work) drawn today and your tests are completely normal, you will receive your results only by: MyChart Message (if you have MyChart) OR A paper copy in the mail If you have any lab test that is abnormal or we need to change your treatment, we will call you to review the results.  Testing/Procedures: None  Follow-Up: At [facility], you and your health needs are our priority.  As part of our continuing mission to provide you with exceptional heart care, our providers are all part of one team.  This team includes your primary Cardiologist (physician) and Advanced Practice Providers or APPs (Physician Assistants and Nurse Practitioners) who all work together to provide you with the care you need, when you need it.  Your next appointment:   6 month(s)  Provider:   Redell Shallow, MD   Other Instructions Please call us  or send a MyChart message with any Cardiology related questions/concerns.  [phone].  Thank you!

## 2024-02-08 ENCOUNTER — Other Ambulatory Visit (HOSPITAL_COMMUNITY): Payer: Self-pay

## 2024-02-08 MED ORDER — SACUBITRIL-VALSARTAN 49-51 MG PO TABS: 1.0000 | ORAL_TABLET | Freq: Two times a day (BID) | ORAL | 11 refills | Status: AC

## 2024-02-08 NOTE — Telephone Encounter (Signed)
 Patient has Nurse, learning disability coverage so not eligible for the PAP application. Patient should utilize copay card at [email or web address].

## 2024-02-15 ENCOUNTER — Ambulatory Visit: Payer: Self-pay | Admitting: Cardiology

## 2024-02-15 LAB — LIPID PANEL
Chol/HDL Ratio: 2.9 ratio (ref 0.0–5.0)
Cholesterol, Total: 87 mg/dL — ABNORMAL LOW (ref 100–199)
HDL: 30 mg/dL — ABNORMAL LOW (ref >39)
LDL Chol Calc (NIH): 43 mg/dL (ref 0–99)
Triglycerides: 62 mg/dL (ref 0–149)
VLDL Cholesterol Cal: 14 mg/dL (ref 5–40)

## 2024-02-15 LAB — BASIC METABOLIC PANEL WITH GFR
BUN/Creatinine Ratio: 12 (ref 9–20)
BUN: 13 mg/dL (ref 6–24)
CO2: 25 mmol/L (ref 20–29)
Calcium: 9.1 mg/dL (ref 8.7–10.2)
Chloride: 102 mmol/L (ref 96–106)
Creatinine, Ser: 1.13 mg/dL (ref 0.76–1.27)
Glucose: 104 mg/dL — ABNORMAL HIGH (ref 70–99)
Potassium: 4.7 mmol/L (ref 3.5–5.2)
Sodium: 139 mmol/L (ref 134–144)
eGFR: 77 mL/min/1.73 (ref >59)

## 2024-02-15 LAB — HEPATIC FUNCTION PANEL
ALT: 22 IU/L (ref 0–44)
AST: 22 IU/L (ref 0–40)
Albumin: 4.3 g/dL (ref 3.8–4.9)
Alkaline Phosphatase: 55 IU/L (ref 44–121)
Bilirubin Total: 0.8 mg/dL (ref 0.0–1.2)
Bilirubin, Direct: 0.3 mg/dL (ref 0.00–0.40)
Total Protein: 7.1 g/dL (ref 6.0–8.5)

## 2024-02-21 NOTE — Telephone Encounter (Signed)
 Letter of results sent to pt

## 2024-04-05 ENCOUNTER — Telehealth: Payer: Self-pay | Admitting: Cardiology

## 2024-04-05 MED ORDER — AMLODIPINE BESYLATE 2.5 MG PO TABS: 2.5000 mg | ORAL_TABLET | Freq: Every day | ORAL | 3 refills | Status: DC

## 2024-04-05 NOTE — Telephone Encounter (Signed)
  Pt c/o BP issue: STAT if pt c/o blurred vision, one-sided weakness or slurred speech.  STAT if BP is GREATER than 180/120 TODAY.  STAT if BP is LESS than 90/60 and SYMPTOMATIC TODAY  1. What is your BP concern? Hypertension   2. Have you taken any BP medication today? Yes   3. What are your last 5 BP readings?160/110 today, 155/97 last night, 151/106 yesterday  4. Are you having any other symptoms (ex. Dizziness, headache, blurred vision, passed out)? No

## 2024-04-05 NOTE — Telephone Encounter (Signed)
 Matthew Lonni BIRCH, MD to [facility] Triage  Matthew Redell RAMAN, MD  (Selected Message)     04/05/24 11:45 AM We can restart his Amlodipine at 2.5 mg daily. Continue to follow BP. Call back next week if still elevated. Medford Fortis the patient and gave him the information above. Sent in RX to preferred pharmacy. He verbalized understanding of all information

## 2024-04-05 NOTE — Telephone Encounter (Signed)
 S/w Matthew Hoffman- reports bp- 160/110 today, 155/97 last night, 151/106 yesterday  Last week it was 140's-150's/90's Metoprolol  in the mornings along with the Entresto  Entresto  is twice a day-  These readings were taken about 2 hours after taking medication and while sitting/at rest for about 15 minutes.   Was last seen in the office 02/07/24- at that visit- Amlodipine and Valsartan  were discontinued and Entresto  was started.   Pt states he is leaving today to go out of town for homecoming. He is asking if some adjustments can be made to his BP medications since they continue to be high since changes were made on 02/07/24.  Informed him that Dr Pietro is not in the office today, but I will send this to our DOD for recommendations and will call back. He denies any symptoms at this time. Verbalized understanding.

## 2024-04-12 ENCOUNTER — Encounter: Payer: Self-pay | Admitting: Cardiology

## 2024-04-12 MED ORDER — AMLODIPINE BESYLATE 5 MG PO TABS: 5.0000 mg | ORAL_TABLET | Freq: Every day | ORAL | 3 refills | Status: AC

## 2024-04-12 NOTE — Telephone Encounter (Signed)
 Pt is requesting a callback regarding him being advised to callback if BP is still elevated since he was told to restart his Amlodipine at 2.5 mg daily. He stated his BP was 157/96 last night and he hasn't checked it today. No symptoms but he is concerned and wants to be advised on what to do next. Please advise

## 2024-04-12 NOTE — Telephone Encounter (Signed)
 Spoke with pt who complains of elevated BP.  Pt reports his BP over the last 3 days has been 152/96, 162/107 and 147/95.  Pt has not checked BP today.  Pt is concerned Amlodipine 2.5mg  is not enough for adequate BP control. Pt denies current CP, SOB, dizziness or headache.  Requested pt send BP this morning through MyChart message after he checks it.   Provided education on how to take most accurate BP (checking 2 hours after medication, resting for 10 minutes prior to checking with both feet on the floor and arm at heart level resting on a table.)   Pt advised will forward for Dr Vertie review.  Pt verbalizes understanding and agrees with current plan.

## 2024-04-12 NOTE — Telephone Encounter (Signed)
Left message for patient with Dr Creshaw's recommendations.   New script sent to the pharmacy  

## 2024-04-20 ENCOUNTER — Other Ambulatory Visit: Payer: Self-pay

## 2024-04-23 MED ORDER — METOPROLOL SUCCINATE ER 25 MG PO TB24: ORAL_TABLET | ORAL | 2 refills | Status: AC
